# Patient Record
Sex: Male | Born: 1975 | Race: White | Hispanic: No | Marital: Married | Smoking: Current some day smoker
Health system: Southern US, Community
[De-identification: ages and names within clinical notes are randomized; demographics above are authoritative.]

## PROBLEM LIST (undated history)

## (undated) DIAGNOSIS — I341 Nonrheumatic mitral (valve) prolapse: Secondary | ICD-10-CM

## (undated) DIAGNOSIS — R3915 Urgency of urination: Secondary | ICD-10-CM

## (undated) DIAGNOSIS — Z9889 Other specified postprocedural states: Secondary | ICD-10-CM

## (undated) DIAGNOSIS — R3916 Straining to void: Secondary | ICD-10-CM

## (undated) DIAGNOSIS — IMO0002 Reserved for concepts with insufficient information to code with codable children: Secondary | ICD-10-CM

## (undated) DIAGNOSIS — R1030 Lower abdominal pain, unspecified: Secondary | ICD-10-CM

## (undated) DIAGNOSIS — R351 Nocturia: Secondary | ICD-10-CM

## (undated) DIAGNOSIS — F32A Depression, unspecified: Secondary | ICD-10-CM

## (undated) DIAGNOSIS — F4310 Post-traumatic stress disorder, unspecified: Secondary | ICD-10-CM

## (undated) DIAGNOSIS — F419 Anxiety disorder, unspecified: Secondary | ICD-10-CM

## (undated) DIAGNOSIS — R51 Headache: Secondary | ICD-10-CM

## (undated) DIAGNOSIS — R350 Frequency of micturition: Secondary | ICD-10-CM

## (undated) DIAGNOSIS — F329 Major depressive disorder, single episode, unspecified: Secondary | ICD-10-CM

## (undated) DIAGNOSIS — F909 Attention-deficit hyperactivity disorder, unspecified type: Secondary | ICD-10-CM

## (undated) DIAGNOSIS — K5792 Diverticulitis of intestine, part unspecified, without perforation or abscess without bleeding: Secondary | ICD-10-CM

## (undated) HISTORY — PX: CARDIAC SURGERY: SHX584

## (undated) HISTORY — PX: TOOTH EXTRACTION: SUR596

---

## 2000-11-08 ENCOUNTER — Encounter: Payer: Self-pay | Admitting: Emergency Medicine

## 2000-11-08 ENCOUNTER — Emergency Department (HOSPITAL_COMMUNITY): Admission: EM | Admit: 2000-11-08 | Discharge: 2000-11-08 | Payer: Self-pay | Admitting: Emergency Medicine

## 2001-05-13 ENCOUNTER — Encounter: Payer: Self-pay | Admitting: Emergency Medicine

## 2001-05-13 ENCOUNTER — Emergency Department (HOSPITAL_COMMUNITY): Admission: EM | Admit: 2001-05-13 | Discharge: 2001-05-13 | Payer: Self-pay | Admitting: Emergency Medicine

## 2002-11-13 ENCOUNTER — Emergency Department (HOSPITAL_COMMUNITY): Admission: EM | Admit: 2002-11-13 | Discharge: 2002-11-13 | Payer: Self-pay | Admitting: Emergency Medicine

## 2002-12-26 ENCOUNTER — Encounter: Payer: Self-pay | Admitting: Emergency Medicine

## 2002-12-26 ENCOUNTER — Emergency Department (HOSPITAL_COMMUNITY): Admission: EM | Admit: 2002-12-26 | Discharge: 2002-12-26 | Payer: Self-pay | Admitting: Emergency Medicine

## 2003-05-02 ENCOUNTER — Encounter: Payer: Self-pay | Admitting: Emergency Medicine

## 2003-05-02 ENCOUNTER — Emergency Department (HOSPITAL_COMMUNITY): Admission: EM | Admit: 2003-05-02 | Discharge: 2003-05-02 | Payer: Self-pay | Admitting: *Deleted

## 2004-09-14 ENCOUNTER — Emergency Department (HOSPITAL_COMMUNITY): Admission: EM | Admit: 2004-09-14 | Discharge: 2004-09-14 | Payer: Self-pay | Admitting: Emergency Medicine

## 2006-03-08 ENCOUNTER — Emergency Department (HOSPITAL_COMMUNITY): Admission: EM | Admit: 2006-03-08 | Discharge: 2006-03-08 | Payer: Self-pay | Admitting: Emergency Medicine

## 2006-04-23 ENCOUNTER — Emergency Department (HOSPITAL_COMMUNITY): Admission: EM | Admit: 2006-04-23 | Discharge: 2006-04-23 | Payer: Self-pay | Admitting: Emergency Medicine

## 2006-12-18 ENCOUNTER — Emergency Department (HOSPITAL_COMMUNITY): Admission: EM | Admit: 2006-12-18 | Discharge: 2006-12-18 | Payer: Self-pay | Admitting: Family Medicine

## 2007-03-16 ENCOUNTER — Emergency Department (HOSPITAL_COMMUNITY): Admission: EM | Admit: 2007-03-16 | Discharge: 2007-03-16 | Payer: Self-pay | Admitting: Emergency Medicine

## 2009-04-30 ENCOUNTER — Emergency Department (HOSPITAL_COMMUNITY): Admission: EM | Admit: 2009-04-30 | Discharge: 2009-04-30 | Payer: Self-pay | Admitting: Emergency Medicine

## 2009-11-23 ENCOUNTER — Emergency Department (HOSPITAL_COMMUNITY): Admission: EM | Admit: 2009-11-23 | Discharge: 2009-11-23 | Payer: Self-pay | Admitting: Emergency Medicine

## 2009-12-22 ENCOUNTER — Emergency Department (HOSPITAL_COMMUNITY): Admission: EM | Admit: 2009-12-22 | Discharge: 2009-12-22 | Payer: Self-pay | Admitting: Emergency Medicine

## 2010-01-01 ENCOUNTER — Emergency Department (HOSPITAL_COMMUNITY): Admission: EM | Admit: 2010-01-01 | Discharge: 2010-01-01 | Payer: Self-pay | Admitting: Emergency Medicine

## 2010-01-13 ENCOUNTER — Emergency Department (HOSPITAL_COMMUNITY): Admission: EM | Admit: 2010-01-13 | Discharge: 2010-01-13 | Payer: Self-pay | Admitting: Emergency Medicine

## 2010-11-05 ENCOUNTER — Ambulatory Visit (HOSPITAL_BASED_OUTPATIENT_CLINIC_OR_DEPARTMENT_OTHER)
Admission: RE | Admit: 2010-11-05 | Discharge: 2010-11-05 | Disposition: A | Discharge status: Home / Self Care | Payer: Medicare Other | Source: Ambulatory Visit | Attending: Urology | Admitting: Urology

## 2010-11-05 ENCOUNTER — Ambulatory Visit (HOSPITAL_COMMUNITY): Payer: Medicare Other | Attending: Urology

## 2010-11-05 DIAGNOSIS — I2510 Atherosclerotic heart disease of native coronary artery without angina pectoris: Secondary | ICD-10-CM

## 2010-11-05 DIAGNOSIS — Z01810 Encounter for preprocedural cardiovascular examination: Secondary | ICD-10-CM | POA: Insufficient documentation

## 2010-11-05 DIAGNOSIS — Z01818 Encounter for other preprocedural examination: Secondary | ICD-10-CM | POA: Insufficient documentation

## 2010-11-05 DIAGNOSIS — Z01812 Encounter for preprocedural laboratory examination: Secondary | ICD-10-CM | POA: Insufficient documentation

## 2010-11-05 DIAGNOSIS — N35919 Unspecified urethral stricture, male, unspecified site: Secondary | ICD-10-CM | POA: Insufficient documentation

## 2010-11-05 DIAGNOSIS — F17200 Nicotine dependence, unspecified, uncomplicated: Secondary | ICD-10-CM

## 2010-11-05 DIAGNOSIS — K219 Gastro-esophageal reflux disease without esophagitis: Secondary | ICD-10-CM | POA: Insufficient documentation

## 2010-11-05 HISTORY — PX: OTHER SURGICAL HISTORY: SHX169

## 2010-11-05 LAB — POCT HEMOGLOBIN-HEMACUE: Hemoglobin: 14.4 g/dL (ref 13.0–17.0)

## 2010-11-12 NOTE — Op Note (Signed)
  NAME:  Steven Montoya, Steven Montoya                 ACCOUNT NO.:  [id number]  MEDICAL RECORD NO.:  [id number]         PATIENT TYPE:  HAMB  LOCATION:                               FACILITY:  [facility]  PHYSICIAN:  Martina Sinner, MD DATE OF BIRTH:  1976-03-16  DATE OF PROCEDURE:  11/05/2010 DATE OF DISCHARGE:                              OPERATIVE REPORT   PREOPERATIVE DIAGNOSIS:  BXO plus urethral stricture.  POSTOPERATIVE DIAGNOSIS:  BXO plus urethral stricture.  SURGERY:  Cystoscopy, retrograde urethrogram plus urethral dilation.  DESCRIPTION OF PROCEDURE:  Mr. Vandermeulen with the above diagnosis.  He was given preoperative antibiotics.  He was prepped and draped in usual fashion.  He has latex allergic.  On physical examination, he had BXO and mild meatal stenosis.  His urethra proximally was not that hard.  I did a retrograde urethrogram using a 6-French open-ended ureteral catheter.  I did 2 films.  They were AP films with the patient in lithotomy.  The patient's penis pulled to the left side.  I did the retrograde urethrogram with approximately 10 mL of contrast.  Dye did not reach beyond the external sphincter which was fine.  He looked like the urethra itself was not that distensible throughout the length of the penile urethra.  I then used male sounds to gently dilate him from 12-20 [place].  I then used a 17-French scope.  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  The proximal bulbar urethra was healthy.  The rest of the bulbar urethra showed small ringlets of approximately a caliber of 22-French.  He then had gray urethra in most of the penile urethra, which should be at his baseline since it was not dilated with the male sounds.  The verumontanum and prostatic urethra and bladder mucosa were normal. There was no stitch, foreign body or carcinoma.  At best, there was grade 1/4 bladder trabeculation.  Bladder was emptied with a catheter.  The  patient was taken to the recovery room.  He will be followed for panurethral stricture disease but symptomatically having trouble from his distal BXO.  Self calibration will be discussed.          ______________________________ Martina Sinner, MD     SAM/MEDQ  D:  11/05/2010  T:  11/06/2010  Job:  [id number]  Electronically Signed by Alfredo Martinez MD on 11/12/2010 12:50:34 PM

## 2010-11-17 LAB — URINALYSIS, ROUTINE W REFLEX MICROSCOPIC
Glucose, UA: NEGATIVE mg/dL
Hgb urine dipstick: NEGATIVE
Ketones, ur: NEGATIVE mg/dL
Protein, ur: NEGATIVE mg/dL
Specific Gravity, Urine: 1.007 (ref 1.005–1.030)
Urobilinogen, UA: 0.2 mg/dL (ref 0.0–1.0)

## 2010-11-17 LAB — POCT CARDIAC MARKERS
Myoglobin, poc: 41.9 ng/mL (ref 12–200)
Troponin i, poc: <0.05 ng/mL (ref 0.00–0.09)

## 2010-11-17 LAB — POCT I-STAT, CHEM 8: Creatinine, Ser: 0.8 mg/dL (ref 0.4–1.5)

## 2010-11-17 LAB — RAPID URINE DRUG SCREEN, HOSP PERFORMED: Cocaine: NOT DETECTED

## 2011-09-04 ENCOUNTER — Emergency Department (INDEPENDENT_AMBULATORY_CARE_PROVIDER_SITE_OTHER): Payer: Medicare Other

## 2011-09-04 ENCOUNTER — Emergency Department (INDEPENDENT_AMBULATORY_CARE_PROVIDER_SITE_OTHER)
Admission: EM | Admit: 2011-09-04 | Discharge: 2011-09-04 | Disposition: A | Discharge status: Home / Self Care | Payer: Medicare Other | Source: Home / Self Care | Attending: Family Medicine | Admitting: Family Medicine

## 2011-09-04 ENCOUNTER — Encounter: Payer: Self-pay | Admitting: Emergency Medicine

## 2011-09-04 DIAGNOSIS — IMO0002 Reserved for concepts with insufficient information to code with codable children: Secondary | ICD-10-CM

## 2011-09-04 DIAGNOSIS — X58XXXA Exposure to other specified factors, initial encounter: Secondary | ICD-10-CM

## 2011-09-04 DIAGNOSIS — S6000XA Contusion of unspecified finger without damage to nail, initial encounter: Secondary | ICD-10-CM

## 2011-09-04 DIAGNOSIS — S60229A Contusion of unspecified hand, initial encounter: Secondary | ICD-10-CM

## 2011-09-04 DIAGNOSIS — T148XXA Other injury of unspecified body region, initial encounter: Secondary | ICD-10-CM

## 2011-09-04 MED ORDER — HYDROCODONE-ACETAMINOPHEN 5-325 MG PO TABS
1.0000 | ORAL_TABLET | Freq: Once | ORAL | Status: AC
  Administered 2011-09-04: 1 via ORAL

## 2011-09-04 MED ORDER — HYDROCODONE-ACETAMINOPHEN 5-325 MG PO TABS: ORAL_TABLET | ORAL | Status: AC

## 2011-09-04 MED ORDER — HYDROCODONE-ACETAMINOPHEN 5-325 MG PO TABS
ORAL_TABLET | ORAL | Status: AC
  Filled 2011-09-04: qty 1

## 2011-09-04 MED ORDER — CEPHALEXIN 500 MG PO CAPS: 500.0000 mg | ORAL_CAPSULE | Freq: Two times a day (BID) | ORAL | Status: AC

## 2011-09-04 NOTE — ED Notes (Signed)
Pt here with left hand index finger injury after slamming digit in car door x 3 hrs ago.pain starting in nailbed with bleeding seen relieved with pressure dressing and radiating to wrist,constant,throbbing pain.bleeding seen at nailbed but intact

## 2011-09-04 NOTE — ED Provider Notes (Signed)
History     CSN: [id number]  Arrival date & time 09/04/11  1233   First MD Initiated Contact with Patient 09/04/11 1357      Chief Complaint  Patient presents with  . Finger Injury    (Consider location/radiation/quality/duration/timing/severity/associated sxs/prior treatment) HPI Comments: Steven Montoya presents for evaluation of pain in his LEFT index finger and LEFT hand after slamming it in the car trunk lid while retrieving groceries.   Patient is a 36 y.o. male presenting with hand injury. The history is provided by the patient.  Hand Injury  The incident occurred 3 to 5 hours ago. The incident occurred at home. The injury mechanism was a direct blow. The pain is present in the left hand and left fingers. The quality of the pain is described as aching and throbbing. The pain is moderate. The pain has been constant since the incident.    History reviewed. No pertinent past medical history.  History reviewed. No pertinent past surgical history.  No family history on file.  History  Substance Use Topics  . Smoking status: Current Everyday Smoker  . Smokeless tobacco: Not on file  . Alcohol Use: No      Review of Systems  Constitutional: Negative.   HENT: Negative.   Eyes: Negative.   Respiratory: Negative.   Cardiovascular: Negative.   Gastrointestinal: Negative.   Genitourinary: Negative.   Musculoskeletal: Negative.        LEFT hand pain  Skin: Negative.   Neurological: Negative.     Allergies  Review of patient's allergies indicates no known allergies.  Home Medications   Current Outpatient Rx  Name Route Sig Dispense Refill  . CEPHALEXIN 500 MG PO CAPS Oral Take 1 capsule (500 mg total) by mouth 2 (two) times daily. 10 capsule 0  . HYDROCODONE-ACETAMINOPHEN 5-325 MG PO TABS  Take one to two tablets every 4 to 6 hours as needed for pain 20 tablet 0    BP 124/83  Pulse 70  Temp(Src) 98.1 F (36.7 C) (Oral)  Resp 16  SpO2 100%  Physical Exam  Nursing  note and vitals reviewed. Constitutional: He is oriented to person, place, and time. He appears well-developed and well-nourished.  HENT:  Head: Normocephalic and atraumatic.  Eyes: EOM are normal.  Neck: Normal range of motion.  Pulmonary/Chest: Effort normal.  Musculoskeletal: Normal range of motion.       Arms:      Hands: Neurological: He is alert and oriented to person, place, and time.  Skin: Skin is warm and dry.  Psychiatric: His behavior is normal.    ED Course  Procedures (including critical care time)  Labs Reviewed - No data to display Dg Hand Complete Left  09/04/2011  *RADIOLOGY REPORT*  Clinical Data: Trauma, pain  LEFT HAND - COMPLETE 3+ VIEW  Comparison: None.  Findings: Normal alignment without displaced fracture.  No focal radiographic swelling.  Radiopaque ring on the fourth finger. Preserved joint spaces.  IMPRESSION: No acute osseous finding.  Original Report Authenticated By: Judie Petit. Ruel Favors, M.D.     1. Laceration   2. Finger contusion   3. Hand contusion       MDM  Xray negative for fracture; laceration well-opposed not requiring any repair; sterile dressing applied        Richardo Priest, MD 09/04/11 1554

## 2012-01-24 ENCOUNTER — Encounter (HOSPITAL_COMMUNITY): Payer: Self-pay

## 2012-01-24 ENCOUNTER — Emergency Department (INDEPENDENT_AMBULATORY_CARE_PROVIDER_SITE_OTHER)
Admission: EM | Admit: 2012-01-24 | Discharge: 2012-01-24 | Disposition: A | Discharge status: Home / Self Care | Payer: Medicaid Other | Source: Home / Self Care

## 2012-01-24 DIAGNOSIS — M546 Pain in thoracic spine: Secondary | ICD-10-CM

## 2012-01-24 MED ORDER — TRAMADOL HCL 50 MG PO TABS: 50.0000 mg | ORAL_TABLET | Freq: Four times a day (QID) | ORAL | Status: AC | PRN

## 2012-01-24 MED ORDER — MELOXICAM 15 MG PO TABS: 15.0000 mg | ORAL_TABLET | Freq: Every day | ORAL | Status: DC

## 2012-01-24 MED ORDER — CYCLOBENZAPRINE HCL 10 MG PO TABS: 10.0000 mg | ORAL_TABLET | Freq: Three times a day (TID) | ORAL | Status: AC | PRN

## 2012-01-24 NOTE — ED Notes (Signed)
Pt c/o back pain following lifting weights yesterday.  Increased pain today.

## 2012-01-24 NOTE — ED Provider Notes (Signed)
Steven Montoya is a 36 y.o. male who presents to Urgent Care today for thoracic back pain.  Patient was lifting weights yesterday and had no pain. However upon awakening this morning he noted significant back pain along the rhomboids on the left side.  He denies any radiating pain weakness or numbness.  He denies any trouble walking or bowel or bladder dysfunction. He has not tried any medications yet this pain.  He has not had any similar symptoms before.   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    PMH reviewed. Significant for ADHD on disability History  Substance Use Topics  . Smoking status: Current Everyday Smoker  . Smokeless tobacco: Not on file  . Alcohol Use: No   ROS as above Medications reviewed. No current facility-administered medications for this encounter.   Current Outpatient Prescriptions  Medication Sig Dispense Refill  . cyclobenzaprine (FLEXERIL) 10 MG tablet Take 1 tablet (10 mg total) by mouth 3 (three) times daily as needed for muscle spasms.  30 tablet  0  . meloxicam (MOBIC) 15 MG tablet Take 1 tablet (15 mg total) by mouth daily.  14 tablet  0  . traMADol (ULTRAM) 50 MG tablet Take 1 tablet (50 mg total) by mouth every 6 (six) hours as needed for pain.  10 tablet  0    Exam:  BP 137/87  Pulse 80  Temp(Src) 98 F (36.7 C) (Oral)  Resp 18  SpO2 97% Gen: Well NAD MUSCULOSKELETAL:  Nontender over spinal midline.  Tender over left rhombus.  Normal shoulder range of motion to external and internal rotation.  Normal hand strength and sensation.  Reflexes are normal and equal in all 4 extremities.   No results found for this or any previous visit (from the past 24 hour(s)). No results found.  Assessment and Plan: 36 y.o. male with rhombus muscle injury.  Patient has a muscular thoracic back pain.  Plan to treat with meloxicam and Flexeril. We'll use tramadol as needed for extreme pain for a few days.  Discussed warning signs or symptoms for  worsening back pain and provided a handout.  Additionally I provided a work. Patient expresses understanding.     Rodolph Bong, MD 01/24/12 2118

## 2012-01-24 NOTE — Discharge Instructions (Signed)
Thank you for coming in today. You pulled a muscle in your back.  Take the meloxicam daily.  Take flexeril as needed at night.  Take the tramadol as needed for bad pain.  Take tylenol as well as needed.  Do not take ibuprofren or aleve.  Come back or go to the emergency room if you notice new weakness new numbness problems walking or bowel or bladder problems.  Back Pain, Adult Back pain is very common. The pain often gets better over time. The cause of back pain is usually not dangerous. Most people can learn to manage their back pain on their own.  HOME CARE   Stay active. Start with short walks on flat ground if you can. Try to walk farther each day.   Do not sit, drive, or stand in one place for more than 30 minutes. Do not stay in bed.   Do not avoid exercise or work. Activity can help your back heal faster.   Be careful when you bend or lift an object. Bend at your knees, keep the object close to you, and do not twist.   Sleep on a firm mattress. Lie on your side, and bend your knees. If you lie on your back, put a pillow under your knees.   Only take medicines as told by your doctor.   Put ice on the injured area.   Put ice in a plastic bag.   Place a towel between your skin and the bag.   Leave the ice on for 15 to 20 minutes, 3 to 4 times a day for the first 2 to 3 days. After that, you can switch between ice and heat packs.   Ask your doctor about back exercises or massage.   Avoid feeling anxious or stressed. Find good ways to deal with stress, such as exercise.  GET HELP RIGHT AWAY IF:   Your pain does not go away with rest or medicine.   Your pain does not go away in 1 week.   You have new problems.   You do not feel well.   The pain spreads into your legs.   You cannot control when you poop (bowel movement) or pee (urinate).   Your arms or legs feel weak or lose feeling (numbness).   You feel sick to your stomach (nauseous) or throw up (vomit).    You have belly (abdominal) pain.   You feel like you may pass out (faint).  MAKE SURE YOU:   Understand these instructions.   Will watch your condition.   Will get help right away if you are not doing well or get worse.  Document Released: 02/02/2008 Document Revised: 08/05/2011 Document Reviewed: 01/04/2011 [facility] Patient Information 2012 [place], [place].

## 2012-01-27 NOTE — ED Provider Notes (Signed)
Medical screening examination/treatment/procedure(s) were performed by the resident and as supervising physician I was immediately available for consultation/collaboration.  Luiz Blare MD   Luiz Blare, MD 01/27/12 [phone]

## 2012-06-28 ENCOUNTER — Encounter (HOSPITAL_COMMUNITY): Payer: Self-pay | Admitting: *Deleted

## 2012-06-28 ENCOUNTER — Emergency Department (HOSPITAL_COMMUNITY)
Admission: EM | Admit: 2012-06-28 | Discharge: 2012-06-28 | Disposition: A | Discharge status: Home / Self Care | Payer: Medicaid Other | Source: Home / Self Care

## 2012-06-28 ENCOUNTER — Emergency Department (INDEPENDENT_AMBULATORY_CARE_PROVIDER_SITE_OTHER): Payer: Medicaid Other

## 2012-06-28 DIAGNOSIS — S0083XA Contusion of other part of head, initial encounter: Secondary | ICD-10-CM

## 2012-06-28 DIAGNOSIS — K044 Acute apical periodontitis of pulpal origin: Secondary | ICD-10-CM

## 2012-06-28 DIAGNOSIS — K047 Periapical abscess without sinus: Secondary | ICD-10-CM

## 2012-06-28 DIAGNOSIS — K089 Disorder of teeth and supporting structures, unspecified: Secondary | ICD-10-CM

## 2012-06-28 DIAGNOSIS — S1093XA Contusion of unspecified part of neck, initial encounter: Secondary | ICD-10-CM

## 2012-06-28 DIAGNOSIS — K0889 Other specified disorders of teeth and supporting structures: Secondary | ICD-10-CM

## 2012-06-28 DIAGNOSIS — S00531A Contusion of lip, initial encounter: Secondary | ICD-10-CM

## 2012-06-28 MED ORDER — KETOROLAC TROMETHAMINE 60 MG/2ML IM SOLN
INTRAMUSCULAR | Status: AC
  Filled 2012-06-28: qty 2

## 2012-06-28 MED ORDER — HYDROCODONE-ACETAMINOPHEN 5-325 MG PO TABS: 1.0000 | ORAL_TABLET | ORAL | Status: DC | PRN

## 2012-06-28 MED ORDER — KETOROLAC TROMETHAMINE 60 MG/2ML IM SOLN
60.0000 mg | Freq: Once | INTRAMUSCULAR | Status: AC
  Administered 2012-06-28: 60 mg via INTRAMUSCULAR

## 2012-06-28 MED ORDER — AMOXICILLIN 500 MG PO CAPS: 500.0000 mg | ORAL_CAPSULE | Freq: Three times a day (TID) | ORAL | Status: DC

## 2012-06-28 NOTE — ED Provider Notes (Signed)
History     CSN: [id number]  Arrival date & time 06/28/12  1224   None     Chief Complaint  Patient presents with  . Facial Injury    (Consider location/radiation/quality/duration/timing/severity/associated sxs/prior treatment) HPI Comments: 36 year old male was putting up a glass door to the shower when a portion of the door fell against the left side of his head and face. He is complaining of pain in the left craniofacial area. Complains of pain across the left zygoma TMJ and parietal cranium. Denies loss of consciousness or change in orientation or behavior. He has remained alert and oriented according to significant other. Denies nausea vomiting double vision, blurred vision or problems with speech hearing or swallowing. Denies focal paresthesias or motor weakness. He states it hurts to open his jaw and can't open it completely due to left TMJ pain. He also is complaining of upper anterolateral teeth pain where one tooth appears to be loose. No apparent dental fractures or avulsions.  Patient is a 36 y.o. male presenting with facial injury.  Facial Injury  Pertinent negatives include no numbness, no visual disturbance, no hearing loss and no neck pain.    History reviewed. No pertinent past medical history.  History reviewed. No pertinent past surgical history.  No family history on file.  History  Substance Use Topics  . Smoking status: Current Every Day Smoker  . Smokeless tobacco: Not on file  . Alcohol Use: No      Review of Systems  Constitutional: Negative for fever, chills, diaphoresis, activity change and fatigue.  HENT: Positive for dental problem. Negative for hearing loss, ear pain, nosebleeds, congestion, sore throat, facial swelling, drooling, trouble swallowing, neck pain, neck stiffness, sinus pressure, tinnitus and ear discharge.   Eyes: Negative for photophobia, pain, discharge, redness, itching and visual disturbance.  Respiratory: Negative.     Gastrointestinal: Negative.   Musculoskeletal: Negative for myalgias, back pain, joint swelling and gait problem.  Neurological: Negative for dizziness, tremors, syncope, facial asymmetry, speech difficulty and numbness.  Psychiatric/Behavioral: Negative.     Allergies  Review of patient's allergies indicates no known allergies.  Home Medications   Current Outpatient Rx  Name Route Sig Dispense Refill  . AMOXICILLIN 500 MG PO CAPS Oral Take 1 capsule (500 mg total) by mouth 3 (three) times daily. 21 capsule 0  . HYDROCODONE-ACETAMINOPHEN 5-325 MG PO TABS Oral Take 1 tablet by mouth every 4 (four) hours as needed for pain. 12 tablet 0  . MELOXICAM 15 MG PO TABS Oral Take 1 tablet (15 mg total) by mouth daily. 14 tablet 0    BP 139/90  Pulse 64  Temp 98 F (36.7 C) (Oral)  Resp 18  SpO2 100%  Physical Exam  Constitutional: He is oriented to person, place, and time. He appears well-developed and well-nourished. No distress.  HENT:  Head: Normocephalic.  Right Ear: External ear normal.  Left Ear: External ear normal.  Nose: Nose normal.  Mouth/Throat: Oropharynx is clear and moist.       Oropharynx is clear, tongue is midline, soft palate rises symmetrically. There is general tenderness over the left upper canine tooth. No evidence of avulsion or fracture. No bleeding. Lower teeth without dental tenderness, buccal mucosa intact. Tenderness over the left zygoma, TMJ, and parietal scalp cranium. No tenderness over the left orbit. No edema or asymmetry is appreciated anywhere in the left hemisphere. Initially he stated he was unable to open his jaw or move it side to side but  when asked to examine his oropharynx he was able to open his jaw almost 100%. He was also able to move his jaw all left and right, back and forth with minimal discomfort.  Nasal bone intact and nontender.  Eyes: Conjunctivae normal and EOM are normal. Pupils are equal, round, and reactive to light.  Neck:  Normal range of motion. Neck supple.  Cardiovascular: Normal rate and normal heart sounds.   Pulmonary/Chest: Effort normal and breath sounds normal.  Lymphadenopathy:    He has no cervical adenopathy.  Neurological: He is alert and oriented to person, place, and time. No cranial nerve deficit. He exhibits normal muscle tone. Coordination normal.  Skin: Skin is warm and dry.    ED Course  Procedures (including critical care time)  Labs Reviewed - No data to display Dg Facial Bones Complete  06/28/2012  *RADIOLOGY REPORT*  Clinical Data: Facial injury  FACIAL BONES COMPLETE 3+V  Comparison: None.  Findings: Five views facial bones submitted.  No displaced fracture or subluxation.  No radiopaque foreign body.  IMPRESSION: No fracture or subluxation is identified.   Original Report Authenticated By: Natasha Mead, M.D.      1. Facial contusion   2. Contusion, lip   3. Pain, dental   4. Tooth infection       MDM  Dg Facial Bones Complete  06/28/2012  *RADIOLOGY REPORT*  Clinical Data: Facial injury  FACIAL BONES COMPLETE 3+V  Comparison: None.  Findings: Five views facial bones submitted.  No displaced fracture or subluxation.  No radiopaque foreign body.  IMPRESSION: No fracture or subluxation is identified.   Original Report Authenticated By: Natasha Mead, M.D.    The no signs of head injury and his x-rays are negative. There is no edema to the head or facial bones. He remained alert oriented and in no distress. His neuro exam is negative. Toradol 60 mg IM now Ibuprofen 400 mg every 6 hours when necessary pain Norco 5 one every 4 hours when necessary pain #12 Follow up with your dentist as soon as possible Amoxicillin 500 3 times a day for 7 days For any signs of head injury as in your written instructions are to go the emergency department promptly. This would include unusual sleepiness difficulty in waking, confusion, disorientation, nausea and vomiting, problems with vision, speech,  hearing, swallowing, numbness or weakness on one side of the body, headache or any other symptom that she was not experiencing now. He is stable on discharge and he has a rapid gait down the hallway well-balanced and having no distress.        Hayden Rasmussen, NP 06/28/12 [phone]

## 2012-06-28 NOTE — ED Notes (Signed)
Pt  Reported   About  4  Hours   Ago   He  Was  Changing  A  Engineer, civil (consulting)  And  The  Door  Hit  Him  In the  Face    He  Reports  He  Did  Not black out    He  Reports  Feeling a  Bit nauseated  At  Time   He  Reports          His   Top   Teeth  Are  Loose  But they  Are  Still   In place  He    Has  Some  Swelling  Of  His  Upper  Lip      HIS  PUPILS  ARE  EQUAL AND  REACTIVE

## 2012-06-29 NOTE — ED Provider Notes (Signed)
Medical screening examination/treatment/procedure(s) were performed by non-physician practitioner and as supervising physician I was immediately available for consultation/collaboration.   [facility]; MD Medical screening examination/treatment/procedure(s) were performed by non-physician practitioner and as supervising physician I was immediately available for consultation/collaboration.   [facility]; MD   Sharin Grave, MD 06/29/12 [phone]

## 2012-11-30 ENCOUNTER — Other Ambulatory Visit: Payer: Self-pay | Admitting: Urology

## 2012-12-08 ENCOUNTER — Encounter (HOSPITAL_BASED_OUTPATIENT_CLINIC_OR_DEPARTMENT_OTHER): Payer: Self-pay | Admitting: *Deleted

## 2012-12-08 NOTE — Progress Notes (Signed)
NPO AFTER MN INCLUDING NO CHEW TOBACCO. ARRIVES AT [phone]. NEEDS HG.

## 2012-12-16 NOTE — Anesthesia Preprocedure Evaluation (Addendum)
Anesthesia Evaluation  Patient identified by MRN, date of birth, ID band Patient awake    Reviewed: Allergy & Precautions, H&P , NPO status , Patient's Chart, lab work & pertinent test results  Airway Mallampati: II TM Distance: >3 FB Neck ROM: Full    Dental no notable dental hx.    Pulmonary former smoker,  Quit smoking 2 months ago. breath sounds clear to auscultation  Pulmonary exam normal       Cardiovascular negative cardio ROS  Rhythm:Regular Rate:Normal     Neuro/Psych negative neurological ROS  negative psych ROS   GI/Hepatic negative GI ROS, Neg liver ROS,   Endo/Other  negative endocrine ROS  Renal/GU negative Renal ROS  negative genitourinary   Musculoskeletal negative musculoskeletal ROS (+)   Abdominal   Peds negative pediatric ROS (+)  Hematology negative hematology ROS (+)   Anesthesia Other Findings   Reproductive/Obstetrics negative OB ROS                          Anesthesia Physical Anesthesia Plan  ASA: II  Anesthesia Plan: General   Post-op Pain Management:    Induction: Intravenous  Airway Management Planned: LMA  Additional Equipment:   Intra-op Plan:   Post-operative Plan: Extubation in OR  Informed Consent: I have reviewed the patients History and Physical, chart, labs and discussed the procedure including the risks, benefits and alternatives for the proposed anesthesia with the patient or authorized representative who has indicated his/her understanding and acceptance.   Dental advisory given  Plan Discussed with: CRNA  Anesthesia Plan Comments:         Anesthesia Quick Evaluation

## 2012-12-17 NOTE — Op Note (Signed)
History of Present Illness  Steven Montoya had meatal stenosis when I saw him in February 2012 due to BXO . He had pan-urethral stricture disease on cystoscopy. I do not think he ever followed up. He saw [facility] on February 25 with gross hematuria for 3 days. A urine culture was negative.  Review of Systems: No other change in bowel or neurologic systems.  Urinalysis: Rare bacteria. He was here with a CT Scan and for likely a cystoscopy.  I reviewed Steven Montoya's CT Scan today and the upper tracts looked normal. I hope the ureters were normal. I am not convinced that on the right side of the bladder that there is not a bladder shadow, that it was probably more of a bowel artifact. When he was cystoscoped in March 2012 under anesthesia, the bladder was normal.  Steven Montoya has significant flow symptoms. He sometimes sit to urinate. His flow is variable, though he was nonspecific. It is hurting in the penis. It does spray. I think there is no question, it is much more frequent during the day and he is getting up 3-4 times at night. He quit smoking in the past. He is having pain after intercourse and with walking.  There is no other modifying factors or associated signs or symptoms. There is no other aggravating or relieving factors. The symptoms are moderate in severity and persistent.  Past Medical History  Problems  1. History of Anxiety (Symptom) 300.00  2. History of Arthritis V13.4  3. History of Esophageal Reflux 530.81  Surgical History  Problems  1. History of Cystoscopy For Urethral Stricture  2. History of No Surgical Problems  Current Meds  1. No Reported Medications  Allergies  Non-Medication  1. Latex  Family History  Problems  1. Paternal history of Congestive Heart Failure  2. Family history of Death In The Family Father  61; heart failure  3. Family history of Family Health Status - Mother's Age  52  4. Family history of Family Health Status Children ___ Living Daughters  2  5. Family  history of Family Health Status Children ___ Living Sons  2  Social History  Problems  1. Caffeine Use  1  2. Marital History - Currently Married  3. Tobacco Use 305.1  smokes 1/2 ppd for 25 years  4. Unemployed  Denied  5. History of Alcohol Use  Results/Data  Urine [Data Includes: Last 1 Day]   02Apr2014   COLOR  YELLOW   APPEARANCE  CLEAR   SPECIFIC GRAVITY  1.025   pH  6.0   GLUCOSE  NEG mg/dL   BILIRUBIN  NEG   KETONE  NEG mg/dL   BLOOD  NEG   PROTEIN  30 mg/dL   UROBILINOGEN  0.2 mg/dL   NITRITE  NEG   LEUKOCYTE ESTERASE  NEG   SQUAMOUS EPITHELIAL/HPF  FEW   WBC  0-2 WBC/hpf   RBC  NONE SEEN RBC/hpf   BACTERIA  RARE   CRYSTALS  NONE SEEN   CASTS  NONE SEEN   Assessment  Assessed  1. 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 Urethral Stricture 598.9  Plan  Microscopic Hematuria (599.72)  1. Cysto Requested for: 02Apr2014  Discussion/Summary  I talked to Steven Montoya and I really believe he likely has recurrence of a stricture disease. We talked about a cystoscopy and balloon dilation.  We talked about balloon dilation in detail. Pros, cons, general surgical and anesthetic risks, and other   options including watchful waiting were discussed. He understands that dilation is generally successful in most cases but [place]-term success rates are low. We talked about the risk of persistent, de novo, or worsening incontinence and voiding dysfunction. Risks were described but not limited to the discussion of injury to neighboring structures and soft tissues. Bleeding, infection, pain, erectile dysfunction, and spraying of urination were discussed. The risk of neuropathy was discussed as well as the usual post-operative course.  He consented to this. We will clear his bladder post dilation.  We will have to talk about prophylactic measures next time. I will rebaseline his stricture disease.  After a thorough review of the management options for the patient's  condition the patient  elected to proceed with surgical therapy as noted above. We have discussed the potential benefits and risks of the procedure, side effects of the proposed treatment, the likelihood of the patient achieving the goals of the procedure, and any potential problems that might occur during the procedure or recuperation. Informed consent has been obtained.  

## 2012-12-18 ENCOUNTER — Encounter (HOSPITAL_BASED_OUTPATIENT_CLINIC_OR_DEPARTMENT_OTHER): Payer: Self-pay | Admitting: Anesthesiology

## 2012-12-18 ENCOUNTER — Encounter (HOSPITAL_BASED_OUTPATIENT_CLINIC_OR_DEPARTMENT_OTHER): Payer: Self-pay | Admitting: *Deleted

## 2012-12-18 ENCOUNTER — Ambulatory Visit (HOSPITAL_BASED_OUTPATIENT_CLINIC_OR_DEPARTMENT_OTHER): Payer: Medicaid Other | Admitting: Anesthesiology

## 2012-12-18 ENCOUNTER — Ambulatory Visit (HOSPITAL_BASED_OUTPATIENT_CLINIC_OR_DEPARTMENT_OTHER)
Admission: RE | Admit: 2012-12-18 | Discharge: 2012-12-18 | Disposition: A | Discharge status: Home / Self Care | Payer: Medicaid Other | Source: Ambulatory Visit | Attending: Urology | Admitting: Urology

## 2012-12-18 ENCOUNTER — Encounter (HOSPITAL_BASED_OUTPATIENT_CLINIC_OR_DEPARTMENT_OTHER)
Admission: RE | Disposition: A | Discharge status: Home / Self Care | Payer: Self-pay | Source: Ambulatory Visit | Attending: Urology

## 2012-12-18 DIAGNOSIS — K219 Gastro-esophageal reflux disease without esophagitis: Secondary | ICD-10-CM | POA: Insufficient documentation

## 2012-12-18 DIAGNOSIS — M129 Arthropathy, unspecified: Secondary | ICD-10-CM | POA: Insufficient documentation

## 2012-12-18 DIAGNOSIS — F17200 Nicotine dependence, unspecified, uncomplicated: Secondary | ICD-10-CM | POA: Insufficient documentation

## 2012-12-18 DIAGNOSIS — N35919 Unspecified urethral stricture, male, unspecified site: Secondary | ICD-10-CM | POA: Insufficient documentation

## 2012-12-18 DIAGNOSIS — Z91040 Latex allergy status: Secondary | ICD-10-CM | POA: Insufficient documentation

## 2012-12-18 DIAGNOSIS — N480 Leukoplakia of penis: Secondary | ICD-10-CM | POA: Insufficient documentation

## 2012-12-18 DIAGNOSIS — N3289 Other specified disorders of bladder: Secondary | ICD-10-CM | POA: Insufficient documentation

## 2012-12-18 DIAGNOSIS — F411 Generalized anxiety disorder: Secondary | ICD-10-CM | POA: Insufficient documentation

## 2012-12-18 HISTORY — DX: Lower abdominal pain, unspecified: R10.30

## 2012-12-18 HISTORY — DX: Reserved for concepts with insufficient information to code with codable children: IMO0002

## 2012-12-18 HISTORY — DX: Frequency of micturition: R35.0

## 2012-12-18 HISTORY — DX: Straining to void: R39.16

## 2012-12-18 HISTORY — DX: Nocturia: R35.1

## 2012-12-18 HISTORY — DX: Urgency of urination: R39.15

## 2012-12-18 LAB — POCT HEMOGLOBIN-HEMACUE: Hemoglobin: 15.6 g/dL (ref 13.0–17.0)

## 2012-12-18 SURGERY — CYSTOSCOPY WITH RETROGRADE URETHROGRAM
Anesthesia: General | Site: Urethra | Wound class: Clean Contaminated

## 2012-12-18 MED ORDER — PROMETHAZINE HCL 25 MG/ML IJ SOLN
6.2500 mg | INTRAMUSCULAR | Status: DC | PRN
Start: 2012-12-18 — End: 2012-12-18
  Filled 2012-12-18: qty 1

## 2012-12-18 MED ORDER — CIPROFLOXACIN HCL 250 MG PO TABS: 250.0000 mg | ORAL_TABLET | Freq: Two times a day (BID) | ORAL | Status: DC

## 2012-12-18 MED ORDER — ONDANSETRON HCL 4 MG/2ML IJ SOLN
INTRAMUSCULAR | Status: DC | PRN
  Administered 2012-12-18: 4 mg via INTRAVENOUS

## 2012-12-18 MED ORDER — OXYCODONE HCL 5 MG PO TABS
5.0000 mg | ORAL_TABLET | Freq: Once | ORAL | Status: AC
  Administered 2012-12-18: 5 mg via ORAL
  Filled 2012-12-18: qty 1

## 2012-12-18 MED ORDER — FENTANYL CITRATE 0.05 MG/ML IJ SOLN
INTRAMUSCULAR | Status: DC | PRN
  Administered 2012-12-18 (×2): 100 ug via INTRAVENOUS

## 2012-12-18 MED ORDER — DEXAMETHASONE SODIUM PHOSPHATE 4 MG/ML IJ SOLN
INTRAMUSCULAR | Status: DC | PRN
  Administered 2012-12-18: 10 mg via INTRAVENOUS

## 2012-12-18 MED ORDER — ACETAMINOPHEN 10 MG/ML IV SOLN
INTRAVENOUS | Status: DC | PRN
  Administered 2012-12-18: 1000 mg via INTRAVENOUS

## 2012-12-18 MED ORDER — CIPROFLOXACIN IN D5W 400 MG/200ML IV SOLN
400.0000 mg | INTRAVENOUS | Status: AC
  Administered 2012-12-18: 400 mg via INTRAVENOUS
  Filled 2012-12-18: qty 200

## 2012-12-18 MED ORDER — FENTANYL CITRATE 0.05 MG/ML IJ SOLN
25.0000 ug | INTRAMUSCULAR | Status: DC | PRN
  Administered 2012-12-18 (×4): 25 ug via INTRAVENOUS
  Filled 2012-12-18: qty 1

## 2012-12-18 MED ORDER — MIDAZOLAM HCL 5 MG/5ML IJ SOLN
INTRAMUSCULAR | Status: DC | PRN
  Administered 2012-12-18: 2 mg via INTRAVENOUS

## 2012-12-18 MED ORDER — PROPOFOL 10 MG/ML IV BOLUS
INTRAVENOUS | Status: DC | PRN
  Administered 2012-12-18: 200 mg via INTRAVENOUS

## 2012-12-18 MED ORDER — 0.9 % SODIUM CHLORIDE (POUR BTL) OPTIME
TOPICAL | Status: DC | PRN
  Administered 2012-12-18: 1000 mL

## 2012-12-18 MED ORDER — IOHEXOL 350 MG/ML SOLN
INTRAVENOUS | Status: DC | PRN
  Administered 2012-12-18: 50 mL via URETHRAL

## 2012-12-18 MED ORDER — KETOROLAC TROMETHAMINE 30 MG/ML IJ SOLN
INTRAMUSCULAR | Status: DC | PRN
  Administered 2012-12-18: 30 mg via INTRAVENOUS

## 2012-12-18 MED ORDER — LIDOCAINE HCL (CARDIAC) 20 MG/ML IV SOLN
INTRAVENOUS | Status: DC | PRN
  Administered 2012-12-18: 80 mg via INTRAVENOUS

## 2012-12-18 MED ORDER — STERILE WATER FOR IRRIGATION IR SOLN
Status: DC | PRN
  Administered 2012-12-18: 3000 mL

## 2012-12-18 MED ORDER — HYDROCODONE-ACETAMINOPHEN 5-325 MG PO TABS: 1.0000 | ORAL_TABLET | Freq: Four times a day (QID) | ORAL | Status: DC | PRN

## 2012-12-18 MED ORDER — LACTATED RINGERS IV SOLN
INTRAVENOUS | Status: DC
  Administered 2012-12-18: 07:00:00 via INTRAVENOUS
  Filled 2012-12-18: qty 1000

## 2012-12-18 SURGICAL SUPPLY — 19 items
BAG DRAIN URO-CYSTO SKYTR STRL (DRAIN) ×2 IMPLANT
BAG DRN UROCATH (DRAIN) ×1
BALLN NEPHROSTOMY (BALLOONS) ×2
BALLOON NEPHROSTOMY (BALLOONS) IMPLANT
CANISTER SUCT LVC 12 LTR MEDI- (MISCELLANEOUS) IMPLANT
CATH INTERMIT  6FR 70CM (CATHETERS) ×1 IMPLANT
CLOTH BEACON ORANGE TIMEOUT ST (SAFETY) ×2 IMPLANT
DRAPE CAMERA CLOSED 9X96 (DRAPES) ×2 IMPLANT
GLOVE BIO SURGEON STRL SZ7.5 (GLOVE) ×1 IMPLANT
GLOVE BIOGEL M STER SZ 6 (GLOVE) ×1 IMPLANT
GLOVE BIOGEL M STRL SZ7.5 (GLOVE) ×1 IMPLANT
GLOVE BIOGEL PI IND STRL 7.5 (GLOVE) IMPLANT
GLOVE BIOGEL PI INDICATOR 7.5 (GLOVE) ×1
GLOVE INDICATOR 7.0 STRL GRN (GLOVE) ×1 IMPLANT
GOWN STRL NON-REIN LRG LVL3 (GOWN DISPOSABLE) ×1 IMPLANT
GOWN STRL REIN XL XLG (GOWN DISPOSABLE) ×2 IMPLANT
GUIDEWIRE STR DUAL SENSOR (WIRE) ×1 IMPLANT
NS IRRIG 500ML POUR BTL (IV SOLUTION) IMPLANT
PACK CYSTOSCOPY (CUSTOM PROCEDURE TRAY) ×2 IMPLANT

## 2012-12-18 NOTE — Transfer of Care (Signed)
Immediate Anesthesia Transfer of Care Note  Patient: Steven Montoya  Procedure(s) Performed: Procedure(s): CYSTOSCOPY BALLOON DILATION WITH RETROGRADE URETHROGRAM (N/A)  Patient Location: PACU  Anesthesia Type:General  Level of Consciousness: awake, alert  and oriented  Airway & Oxygen Therapy: Patient Spontanous Breathing and Patient connected to nasal cannula oxygen  Post-op Assessment: Report given to PACU RN  Post vital signs: Reviewed and stable  Complications: No apparent anesthesia complications

## 2012-12-18 NOTE — Anesthesia Procedure Notes (Signed)
Procedure Name: LMA Insertion Date/Time: 12/18/2012 7:34 AM Performed by: Maris Berger T Pre-anesthesia Checklist: Patient identified, Emergency Drugs available, Suction available and Patient being monitored Patient Re-evaluated:Patient Re-evaluated prior to inductionOxygen Delivery Method: Circle System Utilized Preoxygenation: Pre-oxygenation with 100% oxygen Intubation Type: IV induction Ventilation: Mask ventilation without difficulty LMA: LMA inserted LMA Size: 4.0 Number of attempts: 1 Placement Confirmation: positive ETCO2 Dental Injury: Teeth and Oropharynx as per pre-operative assessment  Comments: Gauze roll between teeth

## 2012-12-18 NOTE — Op Note (Signed)
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 Lorin Picket N[place] Lave  The patient has the above diagnoses and consented the above procedure. Preoperative antibiotics were given. Preoperative leg position was excellent  He had moderate meatal stenosis with a little bit of a contracted meatus and mild BXO. I did a retrograde urethrogram with the patient in lithotomy position. Penis is dry the left side. I cut a 6 [place] open-end ureteral catheter. He just inserted to the meatal stenosis and I could do a nice retrograde with 6 cc of contrast. The dye filled the bulbar and penile urethra and went to the sphincter into the bladder. The caliber of the urethra looked reasonably normal  With male sounds I gently dilated distally from 12-20 [place] with no bleeding.  I then cystoscoped the patient with a 49 [place] scope. He had panurethral stricture disease inspection the penile and distal bulbar urethra with multiple ring let's. The proximal few centimeters of the bulb looked recently healthy. Membranous urethra looked normal. Prostatic urethra looked normal. He grade 1/4 bladder trabeculation. Trigone and ureters were normal. I scanned the bladder twice and there is no mass or foreign body in the bladder keeping in mind a CT scan that I felt may have identified a shadow near the bladder  Sensor wire was easily passed into the bladder. Balloon dilation catheter was passed with its marker to the bulbar urethra. He was dilated to 55 [place] with 18 atmospheres of pressure for 5 minutes. Balloon dilation catheter was removed. I re\re cystoscoped him easily with the scope. Symptomatically in my opinion its the distal 2 cm or so of the urethra and the meatus causing his obstructive symptoms. Wire was removed. There was no  bleeding.  The bladder was emptied and patient was taken to recovery room. Distal calibration will be discussed

## 2012-12-18 NOTE — Anesthesia Postprocedure Evaluation (Signed)
  Anesthesia Post-op Note  Patient: Steven Montoya  Procedure(s) Performed: Procedure(s) (LRB): CYSTOSCOPY BALLOON DILATION WITH RETROGRADE URETHROGRAM (N/A)  Patient Location: PACU  Anesthesia Type: General  Level of Consciousness: awake and alert   Airway and Oxygen Therapy: Patient Spontanous Breathing  Post-op Pain: mild  Post-op Assessment: Post-op Vital signs reviewed, Patient's Cardiovascular Status Stable, Respiratory Function Stable, Patent Airway and No signs of Nausea or vomiting  Last Vitals:  Filed Vitals:   12/18/12 0900  BP: 108/73  Pulse: 72  Temp:   Resp: 14    Post-op Vital Signs: stable   Complications: No apparent anesthesia complications

## 2012-12-19 NOTE — H&P (Signed)
History of Present Illness  Steven Montoya had meatal stenosis when I saw him in February 2012 due to BXO . He had pan-urethral stricture disease on cystoscopy. I do not think he ever followed up. He saw [facility] on February 25 with gross hematuria for 3 days. A urine culture was negative.  Review of Systems: No other change in bowel or neurologic systems.  Urinalysis: Rare bacteria. He was here with a CT Scan and for likely a cystoscopy.  I reviewed Steven Montoya CT Scan today and the upper tracts looked normal. I hope the ureters were normal. I am not convinced that on the right side of the bladder that there is not a bladder shadow, that it was probably more of a bowel artifact. When he was cystoscoped in March 2012 under anesthesia, the bladder was normal.  Steven Montoya has significant flow symptoms. He sometimes sit to urinate. His flow is variable, though he was nonspecific. It is hurting in the penis. It does spray. I think there is no question, it is much more frequent during the day and he is getting up 3-4 times at night. He quit smoking in the past. He is having pain after intercourse and with walking.  There is no other modifying factors or associated signs or symptoms. There is no other aggravating or relieving factors. The symptoms are moderate in severity and persistent.  Past Medical History  Problems  1. History of Anxiety (Symptom) 300.00  2. History of Arthritis V13.4  3. History of Esophageal Reflux 530.81  Surgical History  Problems  1. History of Cystoscopy For Urethral Stricture  2. History of No Surgical Problems  Current Meds  1. No Reported Medications  Allergies  Non-Medication  1. Latex  Family History  Problems  1. Paternal history of Congestive Heart Failure  2. Family history of Death In The Family Father  11; heart failure  3. Family history of Family Health Status - Mother's Age  41  4. Family history of Family Health Status Children ___ Living Daughters  2  5. Family  history of Family Health Status Children ___ Living Sons  2  Social History  Problems  1. Caffeine Use  1  2. Marital History - Currently Married  3. Tobacco Use 305.1  smokes 1/2 ppd for 25 years  4. Unemployed  Denied  5. History of Alcohol Use  Results/Data  Urine [Data Includes: Last 1 Day]   02Apr2014   COLOR  YELLOW   APPEARANCE  CLEAR   SPECIFIC GRAVITY  1.025   pH  6.0   GLUCOSE  NEG mg/dL   BILIRUBIN  NEG   KETONE  NEG mg/dL   BLOOD  NEG   PROTEIN  30 mg/dL   UROBILINOGEN  0.2 mg/dL   NITRITE  NEG   LEUKOCYTE ESTERASE  NEG   SQUAMOUS EPITHELIAL/HPF  FEW   WBC  0-2 WBC/hpf   RBC  NONE SEEN RBC/hpf   BACTERIA  RARE   CRYSTALS  NONE SEEN   CASTS  NONE SEEN   Assessment  Assessed  1. 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 Urethral Stricture 598.9  Plan  Microscopic Hematuria (599.72)  1. Cysto Requested for: 02Apr2014  Discussion/Summary  I talked to Steven Montoya and I really believe he likely has recurrence of a stricture disease. We talked about a cystoscopy and balloon dilation.  We talked about balloon dilation in detail. Pros, cons, general surgical and anesthetic risks, and other  options including watchful waiting were discussed. He understands that dilation is generally successful in most cases but Streetman-term success rates are low. We talked about the risk of persistent, de novo, or worsening incontinence and voiding dysfunction. Risks were described but not limited to the discussion of injury to neighboring structures and soft tissues. Bleeding, infection, pain, erectile dysfunction, and spraying of urination were discussed. The risk of neuropathy was discussed as well as the usual post-operative course.  He consented to this. We will clear his bladder post dilation.  We will have to talk about prophylactic measures next time. I will rebaseline his stricture disease.  After a thorough review of the management options for the patient's  condition the patient  elected to proceed with surgical therapy as noted above. We have discussed the potential benefits and risks of the procedure, side effects of the proposed treatment, the likelihood of the patient achieving the goals of the procedure, and any potential problems that might occur during the procedure or recuperation. Informed consent has been obtained.

## 2013-07-25 ENCOUNTER — Encounter (HOSPITAL_COMMUNITY): Payer: Self-pay | Admitting: Emergency Medicine

## 2013-07-25 ENCOUNTER — Emergency Department (HOSPITAL_COMMUNITY): Payer: Medicaid Other

## 2013-07-25 ENCOUNTER — Emergency Department (HOSPITAL_COMMUNITY)
Admission: EM | Admit: 2013-07-25 | Discharge: 2013-07-25 | Disposition: A | Discharge status: Home / Self Care | Payer: Medicaid Other | Attending: Emergency Medicine | Admitting: Emergency Medicine

## 2013-07-25 DIAGNOSIS — Z87891 Personal history of nicotine dependence: Secondary | ICD-10-CM | POA: Insufficient documentation

## 2013-07-25 DIAGNOSIS — Z87448 Personal history of other diseases of urinary system: Secondary | ICD-10-CM | POA: Insufficient documentation

## 2013-07-25 DIAGNOSIS — K625 Hemorrhage of anus and rectum: Secondary | ICD-10-CM | POA: Insufficient documentation

## 2013-07-25 DIAGNOSIS — Z91040 Latex allergy status: Secondary | ICD-10-CM | POA: Insufficient documentation

## 2013-07-25 LAB — URINALYSIS, ROUTINE W REFLEX MICROSCOPIC
Glucose, UA: NEGATIVE mg/dL
Hgb urine dipstick: NEGATIVE
Ketones, ur: NEGATIVE mg/dL
Protein, ur: 30 mg/dL — AB

## 2013-07-25 LAB — CBC WITH DIFFERENTIAL/PLATELET
Basophils Absolute: 0 10*3/uL (ref 0.0–0.1)
HCT: 41.9 % (ref 39.0–52.0)
Lymphocytes Relative: 25 % (ref 12–46)
Monocytes Absolute: 0.5 10*3/uL (ref 0.1–1.0)
Neutro Abs: 5.4 10*3/uL (ref 1.7–7.7)
Platelets: 251 10*3/uL (ref 150–400)
RBC: 4.7 MIL/uL (ref 4.22–5.81)
RDW: 13.8 % (ref 11.5–15.5)
WBC: 7.9 10*3/uL (ref 4.0–10.5)

## 2013-07-25 LAB — COMPREHENSIVE METABOLIC PANEL
ALT: 16 U/L (ref 0–53)
AST: 18 U/L (ref 0–37)
Alkaline Phosphatase: 66 U/L (ref 39–117)
CO2: 26 mEq/L (ref 19–32)
Chloride: 106 mEq/L (ref 96–112)
GFR calc non Af Amer: >90 mL/min (ref >90)
Sodium: 140 mEq/L (ref 135–145)
Total Bilirubin: 0.7 mg/dL (ref 0.3–1.2)

## 2013-07-25 LAB — URINE MICROSCOPIC-ADD ON

## 2013-07-25 LAB — OCCULT BLOOD, POC DEVICE: Fecal Occult Bld: POSITIVE — AB

## 2013-07-25 MED ORDER — SODIUM CHLORIDE 0.9 % IV BOLUS (SEPSIS)
1000.0000 mL | INTRAVENOUS | Status: AC
  Administered 2013-07-25: 1000 mL via INTRAVENOUS

## 2013-07-25 MED ORDER — PANTOPRAZOLE SODIUM 20 MG PO TBEC: 20.0000 mg | DELAYED_RELEASE_TABLET | Freq: Every day | ORAL | Status: DC

## 2013-07-25 MED ORDER — HYDROMORPHONE HCL PF 1 MG/ML IJ SOLN
1.0000 mg | INTRAMUSCULAR | Status: AC
  Administered 2013-07-25: 1 mg via INTRAVENOUS
  Filled 2013-07-25: qty 1

## 2013-07-25 MED ORDER — IOHEXOL 300 MG/ML  SOLN
25.0000 mL | Freq: Once | INTRAMUSCULAR | Status: AC | PRN
  Administered 2013-07-25: 25 mL via ORAL

## 2013-07-25 MED ORDER — IOHEXOL 300 MG/ML  SOLN
100.0000 mL | Freq: Once | INTRAMUSCULAR | Status: AC | PRN
  Administered 2013-07-25: 100 mL via INTRAVENOUS

## 2013-07-25 MED ORDER — OXYCODONE-ACETAMINOPHEN 5-325 MG PO TABS: 1.0000 | ORAL_TABLET | Freq: Four times a day (QID) | ORAL | Status: DC | PRN

## 2013-07-25 NOTE — ED Provider Notes (Signed)
CSN: [id number]     Arrival date & time 07/25/13  1519 History   First MD Initiated Contact with Patient 07/25/13 1650     Chief Complaint  Patient presents with  . GI Bleeding  . Abdominal Pain   (Consider location/radiation/quality/duration/timing/severity/associated sxs/prior Treatment) Patient is a 37 y.o. male presenting with abdominal pain. The history is provided by the patient.  Abdominal Pain Pain location:  LLQ Pain quality: aching   Pain radiates to:  Does not radiate Pain severity:  Moderate Onset quality:  Gradual Duration:  3 weeks Timing:  Constant Progression:  Unchanged Chronicity:  New Relieved by:  Nothing Worsened by:  Nothing tried Ineffective treatments:  None tried Associated symptoms: no chest pain, no cough, no diarrhea, no dysuria, no fever, no hematuria, no nausea, no shortness of breath and no vomiting     Past Medical History  Diagnosis Date  . Chronic urethral stricture   . Urgency of urination   . Frequency of urination   . Urinary straining   . Nocturia   . Lower abdominal pain    Past Surgical History  Procedure Laterality Date  . Cysto/ retrograde pyelogram/ urethral dilation  11-05-2010   History reviewed. No pertinent family history. History  Substance Use Topics  . Smoking status: Former Smoker -- 0.50 packs/day for 4 years    Types: Cigarettes    Quit date: 10/10/2012  . Smokeless tobacco: Current User    Types: Chew  . Alcohol Use: No     Comment: QUIT 2013    Review of Systems  Constitutional: Negative for fever.  HENT: Negative for drooling and rhinorrhea.   Eyes: Negative for pain.  Respiratory: Negative for cough and shortness of breath.   Cardiovascular: Negative for chest pain and leg swelling.  Gastrointestinal: Positive for abdominal pain. Negative for nausea, vomiting and diarrhea.  Genitourinary: Negative for dysuria and hematuria.  Musculoskeletal: Negative for gait problem and neck pain.  Skin: Negative  for color change.  Neurological: Negative for numbness and headaches.  Hematological: Negative for adenopathy.  Psychiatric/Behavioral: Negative for behavioral problems.  All other systems reviewed and are negative.    Allergies  Latex and Adhesive  Home Medications   Current Outpatient Rx  Name  Route  Sig  Dispense  Refill  . ibuprofen (ADVIL,MOTRIN) 200 MG tablet   Oral   Take 400 mg by mouth every 6 (six) hours as needed for pain.          Marland Kitchen lip balm (CARMEX) ointment   Topical   Apply 1 application topically as needed for lip care or irritation.          Marland Kitchen oxyCODONE-acetaminophen (PERCOCET/ROXICET) 5-325 MG per tablet   Oral   Take 1 tablet by mouth once.          BP 122/79  Pulse 82  Temp(Src) 98.2 F (36.8 C) (Oral)  Resp 16  Wt 134 lb 9.6 oz (61.054 kg)  SpO2 99% Physical Exam  Nursing note and vitals reviewed. Constitutional: He is oriented to person, place, and time. He appears well-developed and well-nourished.  HENT:  Head: Normocephalic and atraumatic.  Right Ear: External ear normal.  Left Ear: External ear normal.  Nose: Nose normal.  Mouth/Throat: Oropharynx is clear and moist. No oropharyngeal exudate.  Eyes: Conjunctivae and EOM are normal. Pupils are equal, round, and reactive to light.  Neck: Normal range of motion. Neck supple.  Cardiovascular: Normal rate, regular rhythm, normal heart sounds and intact distal  pulses.  Exam reveals no gallop and no friction rub.   No murmur heard. Pulmonary/Chest: Effort normal and breath sounds normal. No respiratory distress. He has no wheezes.  Abdominal: Soft. Bowel sounds are normal. He exhibits no distension. There is tenderness (mild to mod LLQ pain). There is no rebound and no guarding.  Genitourinary:  Mild non-inflamed external hemorrhoids. Also several possibly verrucous growths in the peri-rectal area. No gross blood on rectal exam, brownish appearing stool.   Musculoskeletal: Normal range of  motion. He exhibits no edema and no tenderness.  Neurological: He is alert and oriented to person, place, and time.  Skin: Skin is warm and dry.  Psychiatric: He has a normal mood and affect. His behavior is normal.    ED Course  Procedures (including critical care time) Labs Review Labs Reviewed  COMPREHENSIVE METABOLIC PANEL - Abnormal; Notable for the following:    Glucose, Bld 123 (*)    All other components within normal limits  URINALYSIS, ROUTINE W REFLEX MICROSCOPIC - Abnormal; Notable for the following:    APPearance HAZY (*)    Protein, ur 30 (*)    Leukocytes, UA TRACE (*)    All other components within normal limits  URINE MICROSCOPIC-ADD ON - Abnormal; Notable for the following:    Squamous Epithelial / LPF FEW (*)    Bacteria, UA FEW (*)    Casts HYALINE CASTS (*)    All other components within normal limits  OCCULT BLOOD, POC DEVICE - Abnormal; Notable for the following:    Fecal Occult Bld POSITIVE (*)    All other components within normal limits  URINE CULTURE  CBC WITH DIFFERENTIAL  LIPASE, BLOOD   Imaging Review Ct Abdomen Pelvis W Contrast  07/25/2013   CLINICAL DATA:  Left lower quadrant discomfort and rectal bleeding further preceding 3 weeks  EXAM: CT ABDOMEN AND PELVIS WITH CONTRAST  TECHNIQUE: Multidetector CT imaging of the abdomen and pelvis was performed using the standard protocol following bolus administration of intravenous contrast.  CONTRAST:  OMNIPAQUE IOHEXOL 300 MG/ML SOLN intravenously. The patient also received oral contrast material  COMPARISON:  CT scan of the abdomen and pelvis dated November 29, 2012.  FINDINGS: The orally administered contrast has traversed much of the small bowel. It has not yet reached the cecum. The appearance of the small bowel loops does not suggest obstruction. There is stool and gas within the colon. There are scattered diverticula in the descending colon and in the rectosigmoid colon. Incomplete distention of the  left colon limits the sensitivity of the study in terms of detection of mural thickening. No intra-abdominal nor pelvic abscess or free fluid is demonstrated. A normal calibered gas-filled appendix is demonstrated on images 54 through 58 of series 2.  The liver exhibits no focal mass nor ductal dilation. The gallbladder is adequately distended with no evidence of stones or wall thickening or surrounding inflammatory change. The pancreas, spleen, partially distended stomach, adrenal glands, and kidneys again exhibit no acute abnormalities. There is a nonobstructing 2 mm diameter lower pole stone on the left. The caliber of the abdominal aorta is normal. The prostate gland and seminal vesicles appear normal. The partially distended urinary bladder is also normal in appearance. There is no inguinal nor umbilical hernia.  The lumbar vertebral bodies. Are preserved in height. The lung bases are clear.  IMPRESSION: 1. There is no evidence of bowel obstruction or ileus. There are descending colonic and sigmoid diverticula demonstrated without evidence of acute  diverticulitis. Subjective mild prominence of the wall of the descending colon is present which in the appropriate clinical setting could reflect colitis. There is no evidence of an intra-abdominal or pelvic abscess nor free fluid. 2. There is no evidence of acute hepatobiliary abnormality. 3. There is a nonobstructing 2 mm diameter lower pole stone in the left kidney.   Electronically Signed   By: David  [place]   On: 07/25/2013 19:20    EKG Interpretation   None       MDM   1. Rectal bleeding    5:41 PM 36 y.o. male who presents with bright red rectal bleeding for approximately 3 weeks. The patient notes that is usually with bowel movements but occasionally feels a trickle of blood down his leg with ambulation. He also notes constant left lower quadrant abdominal pain. He denies any fevers, vomiting, diarrhea. He is afebrile and vital signs are  unremarkable here. There is no gross blood on his rectal exam but it is Hemoccult positive. Will get CT of abdomen, pain control Dilaudid, IV fluids, and labs.  Hemeoccult pos.   9:09 PM: I interpreted/reviewed the labs and/or imaging which were non-contributory. Pt continues to appear well. His sx may be related to colitis vs internal hemorrhoids. As pt remains stable I think it is approp to have an outpt workup w/ GI.  I have discussed the diagnosis/risks/treatment options with the patient and believe the pt to be eligible for discharge home to follow-up with GI, call for appt. We also discussed returning to the ED immediately if new or worsening sx occur. We discussed the sx which are most concerning (e.g., worsening bloody stools, sob, cp, weakness, dizziness) that necessitate immediate return. Any new prescriptions provided to the patient are listed below.  Discharge Medication List as of 07/25/2013  9:11 PM    START taking these medications   Details  !! oxyCODONE-acetaminophen (PERCOCET) 5-325 MG per tablet Take 1 tablet by mouth every 6 (six) hours as needed., Starting 07/25/2013, Until Discontinued, Print    pantoprazole (PROTONIX) 20 MG tablet Take 1 tablet (20 mg total) by mouth daily., Starting 07/25/2013, Until Discontinued, Print     !! - Potential duplicate medications found. Please discuss with provider.       Junius Argyle, MD 07/26/13 [phone]

## 2013-07-25 NOTE — ED Notes (Signed)
Pt reports bright red rectal bleeding x 3 weeks, reports it has gotten worse and now constant and running down his leg. Having lower abd pain and cramping. No vomiting.

## 2013-07-25 NOTE — ED Notes (Signed)
Patient transported to CT 

## 2013-07-25 NOTE — ED Notes (Signed)
Pt unable to give urine sample at this time 

## 2013-07-25 NOTE — ED Notes (Signed)
CT notified pt done with contrast. 

## 2013-07-25 NOTE — ED Notes (Signed)
NOTIFIED DR. HARRISON IN PERSON OF PATIENT LAB RESULTS OF OCCULT BLOOS STOOL CARD = POSITIVE ( + ) RESULTS @17 :40PM, 07/25/2013.

## 2013-07-27 ENCOUNTER — Telehealth (HOSPITAL_COMMUNITY): Payer: Self-pay

## 2013-07-27 LAB — URINE CULTURE: Colony Count: NO GROWTH

## 2013-07-27 NOTE — ED Notes (Signed)
Pt calling about setting appt w/GI up.  Pt referred to incorrect on call provider for [facility])  Pt given contact info for [facility].  FM attempted to call office to help facilitate appt but office is closed today.  Pt advised to to return as needed for worsening symptoms.

## 2013-07-29 ENCOUNTER — Encounter (HOSPITAL_COMMUNITY): Payer: Self-pay | Admitting: Emergency Medicine

## 2013-07-29 ENCOUNTER — Emergency Department (HOSPITAL_COMMUNITY)
Admission: EM | Admit: 2013-07-29 | Discharge: 2013-07-29 | Disposition: A | Discharge status: Home / Self Care | Payer: Medicaid Other | Attending: Emergency Medicine | Admitting: Emergency Medicine

## 2013-07-29 DIAGNOSIS — Z87448 Personal history of other diseases of urinary system: Secondary | ICD-10-CM | POA: Insufficient documentation

## 2013-07-29 DIAGNOSIS — K644 Residual hemorrhoidal skin tags: Secondary | ICD-10-CM | POA: Insufficient documentation

## 2013-07-29 DIAGNOSIS — F17200 Nicotine dependence, unspecified, uncomplicated: Secondary | ICD-10-CM | POA: Insufficient documentation

## 2013-07-29 DIAGNOSIS — Z79899 Other long term (current) drug therapy: Secondary | ICD-10-CM | POA: Insufficient documentation

## 2013-07-29 DIAGNOSIS — Z91040 Latex allergy status: Secondary | ICD-10-CM | POA: Insufficient documentation

## 2013-07-29 LAB — CBC WITH DIFFERENTIAL/PLATELET
Basophils Absolute: 0 10*3/uL (ref 0.0–0.1)
Basophils Relative: 0 % (ref 0–1)
Eosinophils Absolute: 0.1 10*3/uL (ref 0.0–0.7)
Hemoglobin: 14.6 g/dL (ref 13.0–17.0)
Lymphocytes Relative: 20 % (ref 12–46)
MCH: 30.9 pg (ref 26.0–34.0)
MCHC: 34.9 g/dL (ref 30.0–36.0)
Monocytes Absolute: 0.5 10*3/uL (ref 0.1–1.0)
Monocytes Relative: 6 % (ref 3–12)
Neutro Abs: 6.5 10*3/uL (ref 1.7–7.7)
Neutrophils Relative %: 74 % (ref 43–77)
Platelets: 261 10*3/uL (ref 150–400)
RBC: 4.72 MIL/uL (ref 4.22–5.81)
RDW: 13.7 % (ref 11.5–15.5)
WBC: 8.8 10*3/uL (ref 4.0–10.5)

## 2013-07-29 LAB — COMPREHENSIVE METABOLIC PANEL
AST: 14 U/L (ref 0–37)
Albumin: 4 g/dL (ref 3.5–5.2)
Alkaline Phosphatase: 61 U/L (ref 39–117)
BUN: 17 mg/dL (ref 6–23)
Creatinine, Ser: 0.87 mg/dL (ref 0.50–1.35)
Glucose, Bld: 100 mg/dL — ABNORMAL HIGH (ref 70–99)
Potassium: 4.3 mEq/L (ref 3.5–5.1)
Total Protein: 7 g/dL (ref 6.0–8.3)

## 2013-07-29 MED ORDER — LIDOCAINE 4 % EX CREA: 1.0000 "application " | TOPICAL_CREAM | CUTANEOUS | Status: DC | PRN

## 2013-07-29 MED ORDER — OXYCODONE-ACETAMINOPHEN 5-325 MG PO TABS: 1.0000 | ORAL_TABLET | ORAL | Status: DC | PRN

## 2013-07-29 MED ORDER — OXYCODONE-ACETAMINOPHEN 5-325 MG PO TABS
1.0000 | ORAL_TABLET | Freq: Once | ORAL | Status: AC
  Administered 2013-07-29: 1 via ORAL
  Filled 2013-07-29: qty 1

## 2013-07-29 MED ORDER — HYDROCORTISONE 1 % EX CREA: TOPICAL_CREAM | CUTANEOUS | Status: DC

## 2013-07-29 NOTE — ED Notes (Signed)
Patient discharged to home with family. NAD.  

## 2013-07-29 NOTE — ED Provider Notes (Signed)
CSN: [id number]     Arrival date & time 07/29/13  1439 History   First MD Initiated Contact with Patient 07/29/13 1700     Chief Complaint  Patient presents with  . Rectal Bleeding   (Consider location/radiation/quality/duration/timing/severity/associated sxs/prior Treatment) HPI Steven Montoya is a 37 y.o. male who presents to emergency department complaining of rectal bleeding. Patient states that he has had rectal bleeding for 3-1/2 weeks now. He was seen in emergency Department 4 days ago when bleeding worsened. He had a full workup including CT scan which showed diverticulosis. He also was noted to have hemorrhoids. He was discharged home with Percocet and protonic which she's taking. States he ran out of Percocet this morning. Patient states that he has not followed up with gastroenterologist. States he thinks the bleeding is getting worse and states he has a lot of pain to the rectal area. States pain is worsened when he wipes. He denies any fever, chills, abdominal pain, nausea, vomiting. States bowel movement movements are normal except for blood around it.  Past Medical History  Diagnosis Date  . Chronic urethral stricture   . Urgency of urination   . Frequency of urination   . Urinary straining   . Nocturia   . Lower abdominal pain    Past Surgical History  Procedure Laterality Date  . Cysto/ retrograde pyelogram/ urethral dilation  11-05-2010   History reviewed. No pertinent family history. History  Substance Use Topics  . Smoking status: Current Every Day Smoker -- 0.50 packs/day for 4 years    Types: Cigarettes  . Smokeless tobacco: Current User    Types: Chew  . Alcohol Use: No     Comment: QUIT 2013    Review of Systems  Constitutional: Negative for fever and chills.  Respiratory: Negative for cough, chest tightness and shortness of breath.   Cardiovascular: Negative for chest pain, palpitations and leg swelling.  Gastrointestinal: Positive for blood in stool  and rectal pain. Negative for nausea, vomiting, abdominal pain, diarrhea, constipation and abdominal distention.  Genitourinary: Negative for dysuria, urgency, frequency and hematuria.  Musculoskeletal: Negative for arthralgias, myalgias, neck pain and neck stiffness.  Skin: Negative for rash.  Allergic/Immunologic: Negative for immunocompromised state.  Neurological: Negative for dizziness, weakness, light-headedness, numbness and headaches.    Allergies  Latex and Adhesive  Home Medications   Current Outpatient Rx  Name  Route  Sig  Dispense  Refill  . ibuprofen (ADVIL,MOTRIN) 200 MG tablet   Oral   Take 400 mg by mouth every 6 (six) hours as needed for pain.          Marland Kitchen lip balm (CARMEX) ointment   Topical   Apply 1 application topically as needed for lip care or irritation.          Marland Kitchen oxyCODONE-acetaminophen (PERCOCET) 5-325 MG per tablet   Oral   Take 1 tablet by mouth every 6 (six) hours as needed.   20 tablet   0   . pantoprazole (PROTONIX) 20 MG tablet   Oral   Take 1 tablet (20 mg total) by mouth daily.   30 tablet   0    BP 120/73  Pulse 79  Temp(Src) 98 F (36.7 C) (Oral)  Resp 20  Wt 135 lb 1.6 oz (61.281 kg)  SpO2 98% Physical Exam  Nursing note and vitals reviewed. Constitutional: He is oriented to person, place, and time. He appears well-developed and well-nourished. No distress.  HENT:  Head: Normocephalic and atraumatic.  Eyes: Conjunctivae are normal.  Neck: Normal range of motion. Neck supple.  Cardiovascular: Normal rate, regular rhythm and normal heart sounds.   Pulmonary/Chest: Effort normal and breath sounds normal. No respiratory distress. He has no wheezes. He has no rales.  Abdominal: Soft. Bowel sounds are normal. He exhibits no distension. There is no tenderness.  Genitourinary:  Soft external hemorrhoids one of which is erythematous, tender to palpation, enlarged. No signs of thrombosis. No active bleeding.  Musculoskeletal: He  exhibits no edema and no tenderness.  Lymphadenopathy:    He has no cervical adenopathy.  Neurological: He is alert and oriented to person, place, and time.  Skin: Skin is warm and dry. No erythema.  Psychiatric: He has a normal mood and affect.    ED Course  Procedures (including critical care time) Labs Review Labs Reviewed  COMPREHENSIVE METABOLIC PANEL - Abnormal; Notable for the following:    Glucose, Bld 100 (*)    Total Bilirubin 0.2 (*)    All other components within normal limits  CBC WITH DIFFERENTIAL  URINALYSIS, ROUTINE W REFLEX MICROSCOPIC   Imaging Review No results found. CLINICAL DATA: Left lower quadrant discomfort and rectal bleeding  further preceding 3 weeks  EXAM:  CT ABDOMEN AND PELVIS WITH CONTRAST  TECHNIQUE:  Multidetector CT imaging of the abdomen and pelvis was performed  using the standard protocol following bolus administration of  intravenous contrast.  CONTRAST: OMNIPAQUE IOHEXOL 300 MG/ML SOLN intravenously. The  patient also received oral contrast material  COMPARISON: CT scan of the abdomen and pelvis dated November 29, 2012.  FINDINGS:  The orally administered contrast has traversed much of the small  bowel. It has not yet reached the cecum. The appearance of the small  bowel loops does not suggest obstruction. There is stool and gas  within the colon. There are scattered diverticula in the descending  colon and in the rectosigmoid colon. Incomplete distention of the  left colon limits the sensitivity of the study in terms of detection  of mural thickening. No intra-abdominal nor pelvic abscess or free  fluid is demonstrated. A normal calibered gas-filled appendix is  demonstrated on images 54 through 58 of series 2.  The liver exhibits no focal mass nor ductal dilation. The  gallbladder is adequately distended with no evidence of stones or  wall thickening or surrounding inflammatory change. The pancreas,  spleen, partially distended  stomach, adrenal glands, and kidneys  again exhibit no acute abnormalities. There is a nonobstructing 2 mm  diameter lower pole stone on the left. The caliber of the abdominal  aorta is normal. The prostate gland and seminal vesicles appear  normal. The partially distended urinary bladder is also normal in  appearance. There is no inguinal nor umbilical hernia.  The lumbar vertebral bodies. Are preserved in height. The lung bases  are clear.  IMPRESSION:  1. There is no evidence of bowel obstruction or ileus. There are  descending colonic and sigmoid diverticula demonstrated without  evidence of acute diverticulitis. Subjective mild prominence of the  wall of the descending colon is present which in the appropriate  clinical setting could reflect colitis. There is no evidence of an  intra-abdominal or pelvic abscess nor free fluid.  2. There is no evidence of acute hepatobiliary abnormality.  3. There is a nonobstructing 2 mm diameter lower pole stone in the  left kidney.  Electronically Signed  By: David [place]  On: 07/25/2013 19:20  EKG Interpretation   None  MDM   1. External hemorrhoids     Patient is back to emergency department 4 days after being evaluated here. CT findings at bedtime as above. Patient denies any abdominal pain at this time and he has no abdominal tenderness on exam. I do not think this is colitis. He does have painful external hemorrhoids on exam but no active bleeding. There is no signs of thrombosed hemorrhoids. He has not been in treatment for his hemorrhoids at this time. I will prescribe him more pain medicine, will start sitz bath, hydrocortisone cream, lidocaine cream, and followup with gastroenterologist. His hemoglobin today is normal. He is in no distress. Vital signs are normal.  Filed Vitals:   07/29/13 1445  BP: 120/73  Pulse: 79  Temp: 98 F (36.7 C)  TempSrc: Oral  Resp: 20  Weight: 135 lb 1.6 oz (61.281 kg)  SpO2: 98%      Arbie Blankley A Willia Lampert, PA-C 07/29/13 2014

## 2013-07-29 NOTE — ED Notes (Addendum)
Pt reports he was seen here Wednesday for rectal bleeding and was discharged home. States he was told his test here were normal. States today he noticed more blood in his stool so he took a picture and brought a sample with him. States he has been losing weight and has been having back and abd pain. States he ran out of pain meds this am

## 2013-07-31 NOTE — ED Provider Notes (Signed)
  Medical screening examination/treatment/procedure(s) were performed by non-physician practitioner and as supervising physician I was immediately available for consultation/collaboration.  EKG Interpretation   None          Gerhard Munch, MD 07/31/13 [phone]

## 2013-08-07 ENCOUNTER — Encounter (INDEPENDENT_AMBULATORY_CARE_PROVIDER_SITE_OTHER): Payer: Self-pay | Admitting: General Surgery

## 2013-08-07 ENCOUNTER — Encounter (HOSPITAL_COMMUNITY): Payer: Self-pay | Admitting: Pharmacy Technician

## 2013-08-07 ENCOUNTER — Encounter (HOSPITAL_COMMUNITY): Payer: Self-pay | Admitting: *Deleted

## 2013-08-07 ENCOUNTER — Ambulatory Visit (INDEPENDENT_AMBULATORY_CARE_PROVIDER_SITE_OTHER): Payer: Medicaid Other | Admitting: General Surgery

## 2013-08-07 VITALS — BP 110/70 | HR 72 | Temp 98.9°F | Resp 14 | Ht 68.0 in | Wt 137.8 lb

## 2013-08-07 DIAGNOSIS — R1032 Left lower quadrant pain: Secondary | ICD-10-CM

## 2013-08-07 DIAGNOSIS — R634 Abnormal weight loss: Secondary | ICD-10-CM

## 2013-08-07 DIAGNOSIS — K625 Hemorrhage of anus and rectum: Secondary | ICD-10-CM

## 2013-08-07 DIAGNOSIS — K6289 Other specified diseases of anus and rectum: Secondary | ICD-10-CM

## 2013-08-07 DIAGNOSIS — K649 Unspecified hemorrhoids: Secondary | ICD-10-CM

## 2013-08-07 MED ORDER — LIDOCAINE 4 % EX CREA: 1.0000 "application " | TOPICAL_CREAM | CUTANEOUS | Status: DC | PRN

## 2013-08-07 MED ORDER — HYDROCORTISONE 2.5 % RE CREA: 1.0000 "application " | TOPICAL_CREAM | Freq: Two times a day (BID) | RECTAL | Status: DC

## 2013-08-07 MED ORDER — LIDOCAINE 5 % EX OINT: 1.0000 "application " | TOPICAL_OINTMENT | CUTANEOUS | Status: DC | PRN

## 2013-08-07 NOTE — Progress Notes (Signed)
Chief Complaint  Patient presents with  . New Evaluation    eval hems    HISTORY: Steven Montoya is a 37 y.o. male who presents to the office with rectal bleeding.  Other symptoms include abd pain.  This had been occurring for about a month.  He has tried lidocaine, sitz baths and hem pads in the past with some success.  Nothing makes the symptoms worse.   It is continuous in nature.  His bowel habits are regular and his bowel movements are loose with laxatives.  His fiber intake is dietary.  He has never had colonoscopy.      Past Medical History  Diagnosis Date  . Chronic urethral stricture   . Urgency of urination   . Frequency of urination   . Urinary straining   . Nocturia   . Lower abdominal pain       Past Surgical History  Procedure Laterality Date  . Cysto/ retrograde pyelogram/ urethral dilation  11-05-2010        Current Outpatient Prescriptions  Medication Sig Dispense Refill  . HYDROcodone-acetaminophen (NORCO/VICODIN) 5-325 MG per tablet Take 1 tablet by mouth every 6 (six) hours as needed for moderate pain.      Marland Kitchen ibuprofen (ADVIL,MOTRIN) 200 MG tablet Take 400 mg by mouth every 6 (six) hours as needed for pain.       Marland Kitchen lip balm (CARMEX) ointment Apply 1 application topically as needed for lip care or irritation.       Steven Montoya LIQD Apply topically.      . hydrocortisone cream 1 % Apply to affected area 2 times daily  15 g  0  . lidocaine (LMX) 4 % cream Apply 1 application topically as needed.  30 g  0  . oxyCODONE-acetaminophen (PERCOCET) 5-325 MG per tablet Take 1 tablet by mouth every 6 (six) hours as needed.  20 tablet  0  . oxyCODONE-acetaminophen (PERCOCET) 5-325 MG per tablet Take 1 tablet by mouth every 4 (four) hours as needed for severe pain.  20 tablet  0  . pantoprazole (PROTONIX) 20 MG tablet Take 1 tablet (20 mg total) by mouth daily.  30 tablet  0   No current facility-administered medications for this visit.      Allergies  Allergen Reactions   . Latex Other (See Comments)    HX SUPERFICIAL THROMBOPHLEBITIS LEFT FOREARM AFTER IV IN APR 2011  . Adhesive [Tape] Rash      Family History  Problem Relation Age of Onset  . Heart disease Father     History   Social History  . Marital Status: Married    Spouse Name: N/A    Number of Children: N/A  . Years of Education: N/A   Social History Main Topics  . Smoking status: Current Every Day Smoker -- 0.50 packs/day for 4 years    Types: Cigarettes  . Smokeless tobacco: Current User    Types: Chew  . Alcohol Use: No     Comment: QUIT 2013  . Drug Use: Yes    Special: Marijuana     Comment: OCCASIONAL  . Sexual Activity: None   Other Topics Concern  . None   Social History Narrative  . None      REVIEW OF SYSTEMS - PERTINENT POSITIVES ONLY: Review of Systems - General ROS: negative for - chills, fever, pos for weight loss Hematological and Lymphatic ROS: negative for - bleeding problems, blood clots or bruising Respiratory ROS: no cough,  shortness of breath, or wheezing Cardiovascular ROS: no chest pain or dyspnea on exertion Gastrointestinal ROS: positive for - abdominal pain and diarrhea negative for - nausea/vomiting Genito-Urinary ROS: no dysuria, trouble voiding, or hematuria  EXAM: Filed Vitals:   08/07/13 0930  BP: 110/70  Pulse: 72  Temp: 98.9 F (37.2 C)  Resp: 14    General appearance: alert and cooperative Resp: clear to auscultation bilaterally Cardio: regular rate and rhythm GI: normal findings: no masses palpable and abnormal findings:  LLQ tenderness  Findings: edematous external hemorrhoids, tender to touch, unable to do DRE or anoscopy due to pain    ASSESSMENT AND PLAN: Steven Montoya is a 37 year old male who has approximately one-month history of abdominal pain and rectal bleeding. He reports approximately a 20 pound weight loss during this time as well. On exam he has some inflamed external hemorrhoids and blood noted externally as  well. He is complaining of leakage and incontinence. His most recent CT scan also shows some slight swelling in the descending colon.  Given all of these signs and symptoms, I am concerned that there may be more going on than just hemorrhoids. I will like to do a colonoscopy to evaluate this further. I have given him Anusol cream and lidocaine cream for his anal pain. I have also instructed him to use sitz baths 3-4 times a day. We will try to get him on the schedule as soon as possible for the colonoscopy.    Steven Panda, MD Colon and Rectal Surgery / General Surgery [facility]      Visit Diagnoses: No diagnosis found.  Primary Care Physician: No PCP Per Patient

## 2013-08-07 NOTE — Patient Instructions (Signed)
[facility]  ONE-DAY (1) PRE-OP HOME COLON PREP INSTRUCTIONS: ** MIRALAX / GATORADE PREP **  You must follow the instructions below carefully.  If you have questions or problems, please call and speak to someone in the clinic department at our office:   [phone].     INSTRUCTIONS: 1. Five days prior to your procedure do not eat nuts, popcorn, or fruit with seeds.  Stop all fiber supplements such as Metamucil, Citrucel, etc. 2. Two days before surgery fill the prescription at a pharmacy of your choice and purchase the additional supplies below.         MIRALAX - GATORADE -- DULCOLAX TABS:   Purchase a bottle of MIRALAX  (255 gm bottle)    In addition, purchase four (4) DULCOLAX TABLETS (no prescription required- ask the pharmacist if you can't find them)    Purchase one 64 oz GATORADE.  (Do NOT purchase red Gatorade; any other flavor is acceptable) and place in refrigerator to get cold.  3.   Day Before Surgery:   6 am: take the 4 Dulcolax tablets   You may only have clear liquids (tea, coffee, juice, broth, jello, soft drinks, gummy bears).  You cannot have solid foods, cream, milk or milk products.  Drink at lease 8 ounces of liquids every hour while awake.   Mix the entire bottle of MiraLax and the Gatorade in a large container.    10:00am: Begin drinking the Gatorade mixture until gone (8 oz every 15-30 minutes).      You may suck on a lime wedge or hard candy to "freshen your palate" in between glasses   If you are a diabetic, take your blood sugar reading several time throughout the prep.  Have some juice available to take if your sugar level gets too low   You may feel chilled while taking the prep.  Have some warm tea or broth to help warm up.   Continue clear liquids until midnight or bedtime  3. The day of your procedure:   Do not eat or drink ANYTHING after midnight before your surgery.     If you take Heart or Blood Pressure medicine, ask the pre-op nurses about  these during your preop appointment.   Further pre-operative instructions will be given to you from the hospital.   Expect to be contacted 5-7 days before your surgery.  

## 2013-08-16 ENCOUNTER — Telehealth (INDEPENDENT_AMBULATORY_CARE_PROVIDER_SITE_OTHER): Payer: Self-pay | Admitting: *Deleted

## 2013-08-16 ENCOUNTER — Ambulatory Visit (INDEPENDENT_AMBULATORY_CARE_PROVIDER_SITE_OTHER): Payer: Medicaid Other | Admitting: General Surgery

## 2013-08-16 NOTE — Telephone Encounter (Signed)
That is the cheapest prep available.  If you get the generic brands, it is less that $10.

## 2013-08-16 NOTE — Telephone Encounter (Signed)
Patient's girlfriend called to state that patient went and was able to get his antibiotics that were prescribed however patient is unable to afford the Dulcolax and Miralax for the bowel prep.  Explained that we don't have those here and can't give him anything.  Explained that he has to have the bowel prep or they won't be able to see anything on the colonoscopy.  Girlfriend states that she is going to try to work on it and see what they can do to get it.  Explained for them to give [place] a call back to let us know if patient is unable to do the bowel prep so other arrangements can be made.  Girlfriend states understanding.

## 2013-08-17 ENCOUNTER — Ambulatory Visit (HOSPITAL_COMMUNITY): Payer: Medicaid Other | Admitting: Anesthesiology

## 2013-08-17 ENCOUNTER — Encounter (HOSPITAL_COMMUNITY): Payer: Medicaid Other | Admitting: Anesthesiology

## 2013-08-17 ENCOUNTER — Ambulatory Visit (HOSPITAL_COMMUNITY)
Admission: RE | Admit: 2013-08-17 | Discharge: 2013-08-17 | Disposition: A | Discharge status: Home / Self Care | Payer: Medicaid Other | Source: Ambulatory Visit | Attending: General Surgery | Admitting: General Surgery

## 2013-08-17 ENCOUNTER — Encounter (HOSPITAL_COMMUNITY)
Admission: RE | Disposition: A | Discharge status: Home / Self Care | Payer: Self-pay | Source: Ambulatory Visit | Attending: General Surgery

## 2013-08-17 ENCOUNTER — Encounter (HOSPITAL_COMMUNITY): Payer: Self-pay

## 2013-08-17 DIAGNOSIS — R109 Unspecified abdominal pain: Secondary | ICD-10-CM | POA: Insufficient documentation

## 2013-08-17 DIAGNOSIS — F17200 Nicotine dependence, unspecified, uncomplicated: Secondary | ICD-10-CM | POA: Insufficient documentation

## 2013-08-17 DIAGNOSIS — R197 Diarrhea, unspecified: Secondary | ICD-10-CM | POA: Insufficient documentation

## 2013-08-17 DIAGNOSIS — K625 Hemorrhage of anus and rectum: Secondary | ICD-10-CM | POA: Insufficient documentation

## 2013-08-17 DIAGNOSIS — R51 Headache: Secondary | ICD-10-CM | POA: Insufficient documentation

## 2013-08-17 DIAGNOSIS — R634 Abnormal weight loss: Secondary | ICD-10-CM | POA: Insufficient documentation

## 2013-08-17 HISTORY — DX: Headache: R51

## 2013-08-17 HISTORY — PX: COLONOSCOPY: SHX5424

## 2013-08-17 SURGERY — COLONOSCOPY
Anesthesia: Monitor Anesthesia Care

## 2013-08-17 MED ORDER — FENTANYL CITRATE 0.05 MG/ML IJ SOLN
25.0000 ug | INTRAMUSCULAR | Status: DC | PRN
  Administered 2013-08-17 (×4): 25 ug via INTRAVENOUS

## 2013-08-17 MED ORDER — FENTANYL CITRATE 0.05 MG/ML IJ SOLN
INTRAMUSCULAR | Status: DC | PRN
  Administered 2013-08-17: 50 ug via INTRAVENOUS

## 2013-08-17 MED ORDER — PROPOFOL INFUSION 10 MG/ML OPTIME
INTRAVENOUS | Status: DC | PRN
  Administered 2013-08-17: 120 ug/kg/min via INTRAVENOUS

## 2013-08-17 MED ORDER — SODIUM CHLORIDE 0.9 % IV SOLN
INTRAVENOUS | Status: DC
  Administered 2013-08-17: 12:00:00 via INTRAVENOUS

## 2013-08-17 MED ORDER — FENTANYL CITRATE 0.05 MG/ML IJ SOLN
INTRAMUSCULAR | Status: AC
  Filled 2013-08-17: qty 2

## 2013-08-17 MED ORDER — MIDAZOLAM HCL 2 MG/2ML IJ SOLN
INTRAMUSCULAR | Status: AC
  Filled 2013-08-17: qty 2

## 2013-08-17 MED ORDER — PROPOFOL 10 MG/ML IV BOLUS
INTRAVENOUS | Status: AC
  Filled 2013-08-17: qty 20

## 2013-08-17 MED ORDER — FENTANYL CITRATE 0.05 MG/ML IJ SOLN: 50.0000 ug | INTRAMUSCULAR | Status: DC | PRN

## 2013-08-17 MED ORDER — KETAMINE HCL 50 MG/ML IJ SOLN
INTRAMUSCULAR | Status: AC
  Filled 2013-08-17: qty 10

## 2013-08-17 MED ORDER — MIDAZOLAM HCL 5 MG/5ML IJ SOLN
INTRAMUSCULAR | Status: DC | PRN
  Administered 2013-08-17: 1 mg via INTRAVENOUS
  Administered 2013-08-17: 2 mg via INTRAVENOUS
  Administered 2013-08-17: 1 mg via INTRAVENOUS

## 2013-08-17 MED ORDER — LIDOCAINE HCL (CARDIAC) 20 MG/ML IV SOLN
INTRAVENOUS | Status: AC
  Filled 2013-08-17: qty 5

## 2013-08-17 MED ORDER — LACTATED RINGERS IV SOLN
INTRAVENOUS | Status: DC
  Administered 2013-08-17: 1000 mL via INTRAVENOUS

## 2013-08-17 MED ORDER — KETAMINE HCL 10 MG/ML IJ SOLN
INTRAMUSCULAR | Status: DC | PRN
  Administered 2013-08-17: 10 mg via INTRAVENOUS
  Administered 2013-08-17 (×2): 20 mg via INTRAVENOUS

## 2013-08-17 NOTE — Op Note (Addendum)
[facility] [address] [place], [address]   COLONOSCOPY PROCEDURE REPORT  PATIENT: Steven Montoya, Steven Montoya  MR#: [id number] BIRTHDATE: 03-Oct-1975 , 36  yrs. old GENDER: Male ENDOSCOPIST: Vanita Panda, MD REFERRED BY: PROCEDURE DATE:  08/17/2013 PROCEDURE:   Colonoscopy, diagnostic ASA CLASS:   Class II INDICATIONS:unexplained diarrhea, Rectal Bleeding, Weight loss, and Abdominal pain. MEDICATIONS: MAC sedation, administered by CRNA  DESCRIPTION OF PROCEDURE:   After the risks benefits and alternatives of the procedure were thoroughly explained, informed consent was obtained.  A digital rectal exam revealed internal hemorrhoids.   The   endoscope was introduced through the anus and advanced to the terminal ileum which was intubated for a short distance. No adverse events experienced.   The quality of the prep was excellent, using MiraLax  The instrument was then slowly withdrawn as the colon was fully examined.      COLON FINDINGS: There was no evidence of mucosal inflammation.   A normal appearing cecum, ileocecal valve, and appendiceal orifice were identified.  The ascending, hepatic flexure, transverse, splenic flexure, descending, sigmoid colon and rectum appeared unremarkable.  No polyps or cancers were seen.  Multiple biopsies of the colon were performed.  These were taken in the terminal ileum, right colon and left colon to evaluate for microscopic colitis.  Retroflexed views revealed internal hemorrhoids.  The scope was withdrawn and the procedure completed.   WITHDRAWAL TIME:11 minutes 0 seconds COMPLICATIONS: There were no complications. ENDOSCOPIC IMPRESSION: 1.   There was no evidence of mucosal inflammation 2.   Normal colon  RECOMMENDATIONS: Await biopsy results eSigned:  Vanita Panda, MD 08/17/2013 12:38 PM Revised: 08/17/2013 12:38 PM  cc:

## 2013-08-17 NOTE — H&P (View-Only) (Signed)
Chief Complaint  Patient presents with  . New Evaluation    eval hems    HISTORY: Steven Montoya is a 36 y.o. male who presents to the office with rectal bleeding.  Other symptoms include abd pain.  This had been occurring for about a month.  He has tried lidocaine, sitz baths and hem pads in the past with some success.  Nothing makes the symptoms worse.   It is continuous in nature.  His bowel habits are regular and his bowel movements are loose with laxatives.  His fiber intake is dietary.  He has never had colonoscopy.      Past Medical History  Diagnosis Date  . Chronic urethral stricture   . Urgency of urination   . Frequency of urination   . Urinary straining   . Nocturia   . Lower abdominal pain       Past Surgical History  Procedure Laterality Date  . Cysto/ retrograde pyelogram/ urethral dilation  11-05-2010        Current Outpatient Prescriptions  Medication Sig Dispense Refill  . HYDROcodone-acetaminophen (NORCO/VICODIN) 5-325 MG per tablet Take 1 tablet by mouth every 6 (six) hours as needed for moderate pain.      . ibuprofen (ADVIL,MOTRIN) 200 MG tablet Take 400 mg by mouth every 6 (six) hours as needed for pain.       . lip balm (CARMEX) ointment Apply 1 application topically as needed for lip care or irritation.       . [place] Hazel LIQD Apply topically.      . hydrocortisone cream 1 % Apply to affected area 2 times daily  15 g  0  . lidocaine (LMX) 4 % cream Apply 1 application topically as needed.  30 g  0  . oxyCODONE-acetaminophen (PERCOCET) 5-325 MG per tablet Take 1 tablet by mouth every 6 (six) hours as needed.  20 tablet  0  . oxyCODONE-acetaminophen (PERCOCET) 5-325 MG per tablet Take 1 tablet by mouth every 4 (four) hours as needed for severe pain.  20 tablet  0  . pantoprazole (PROTONIX) 20 MG tablet Take 1 tablet (20 mg total) by mouth daily.  30 tablet  0   No current facility-administered medications for this visit.      Allergies  Allergen Reactions   . Latex Other (See Comments)    HX SUPERFICIAL THROMBOPHLEBITIS LEFT FOREARM AFTER IV IN APR 2011  . Adhesive [Tape] Rash      Family History  Problem Relation Age of Onset  . Heart disease Father     History   Social History  . Marital Status: Married    Spouse Name: N/A    Number of Children: N/A  . Years of Education: N/A   Social History Main Topics  . Smoking status: Current Every Day Smoker -- 0.50 packs/day for 4 years    Types: Cigarettes  . Smokeless tobacco: Current User    Types: Chew  . Alcohol Use: No     Comment: QUIT 2013  . Drug Use: Yes    Special: Marijuana     Comment: OCCASIONAL  . Sexual Activity: None   Other Topics Concern  . None   Social History Narrative  . None      REVIEW OF SYSTEMS - PERTINENT POSITIVES ONLY: Review of Systems - General ROS: negative for - chills, fever, pos for weight loss Hematological and Lymphatic ROS: negative for - bleeding problems, blood clots or bruising Respiratory ROS: no cough,   shortness of breath, or wheezing Cardiovascular ROS: no chest pain or dyspnea on exertion Gastrointestinal ROS: positive for - abdominal pain and diarrhea negative for - nausea/vomiting Genito-Urinary ROS: no dysuria, trouble voiding, or hematuria  EXAM: Filed Vitals:   08/07/13 0930  BP: 110/70  Pulse: 72  Temp: 98.9 F (37.2 C)  Resp: 14    General appearance: alert and cooperative Resp: clear to auscultation bilaterally Cardio: regular rate and rhythm GI: normal findings: no masses palpable and abnormal findings:  LLQ tenderness  Findings: edematous external hemorrhoids, tender to touch, unable to do DRE or anoscopy due to pain    ASSESSMENT AND PLAN: Steven Montoya is a 36-year-old male who has approximately one-month history of abdominal pain and rectal bleeding. He reports approximately a 20 pound weight loss during this time as well. On exam he has some inflamed external hemorrhoids and blood noted externally as  well. He is complaining of leakage and incontinence. His most recent CT scan also shows some slight swelling in the descending colon.  Given all of these signs and symptoms, I am concerned that there may be more going on than just hemorrhoids. I will like to do a colonoscopy to evaluate this further. I have given him Anusol cream and lidocaine cream for his anal pain. I have also instructed him to use sitz baths 3-4 times a day. We will try to get him on the schedule as soon as possible for the colonoscopy.    Sosie Gato C Loyd Marhefka, MD Colon and Rectal Surgery / General Surgery [facility]      Visit Diagnoses: No diagnosis found.  Primary Care Physician: No PCP Per Patient   

## 2013-08-17 NOTE — Interval H&P Note (Signed)
History and Physical Interval Note:  08/17/2013 11:14 AM  Steven Montoya  has presented today for surgery, with the diagnosis of abdominal pain rectal pain   The various methods of treatment have been discussed with the patient and family. After consideration of risks, benefits and other options for treatment, the patient has consented to  Procedure(s): COLONOSCOPY (N/A) as a surgical intervention .  The patient's history has been reviewed, patient examined, no change in status, stable for surgery.  I have reviewed the patient's chart and labs.  Questions were answered to the patient's satisfaction.     Vanita Panda, MD  Colorectal and General Surgery [facility]

## 2013-08-17 NOTE — Anesthesia Postprocedure Evaluation (Signed)
  Anesthesia Post-op Note  Patient: Steven Montoya  Procedure(s) Performed: Procedure(s) (LRB): COLONOSCOPY (N/A)  Patient Location: PACU  Anesthesia Type: MAC  Level of Consciousness: awake and alert   Airway and Oxygen Therapy: Patient Spontanous Breathing  Post-op Pain: mild  Post-op Assessment: Post-op Vital signs reviewed, Patient's Cardiovascular Status Stable, Respiratory Function Stable, Patent Airway and No signs of Nausea or vomiting  Last Vitals:  Filed Vitals:   08/17/13 1243  BP: 120/67  Pulse:   Temp:   Resp: 27    Post-op Vital Signs: stable   Complications: No apparent anesthesia complications

## 2013-08-17 NOTE — Anesthesia Preprocedure Evaluation (Addendum)
Anesthesia Evaluation  Patient identified by MRN, date of birth, ID band Patient awake    Reviewed: Allergy & Precautions, H&P , NPO status , Patient's Chart, lab work & pertinent test results  Airway Mallampati: II TM Distance: >3 FB Neck ROM: full    Dental no notable dental hx. (+) Teeth Intact and Dental Advisory Given   Pulmonary Current Smoker,  breath sounds clear to auscultation  Pulmonary exam normal       Cardiovascular Exercise Tolerance: Good negative cardio ROS  Rhythm:regular Rate:Normal     Neuro/Psych  Headaches, negative neurological ROS  negative psych ROS   GI/Hepatic negative GI ROS, Neg liver ROS,   Endo/Other  negative endocrine ROS  Renal/GU negative Renal ROS  negative genitourinary   Musculoskeletal   Abdominal   Peds  Hematology negative hematology ROS (+)   Anesthesia Other Findings   Reproductive/Obstetrics negative OB ROS                          Anesthesia Physical Anesthesia Plan  ASA: II  Anesthesia Plan: MAC   Post-op Pain Management:    Induction:   Airway Management Planned: Simple Face Mask  Additional Equipment:   Intra-op Plan:   Post-operative Plan:   Informed Consent: I have reviewed the patients History and Physical, chart, labs and discussed the procedure including the risks, benefits and alternatives for the proposed anesthesia with the patient or authorized representative who has indicated his/her understanding and acceptance.   Dental Advisory Given  Plan Discussed with: CRNA and Surgeon  Anesthesia Plan Comments:         Anesthesia Quick Evaluation

## 2013-08-17 NOTE — Progress Notes (Addendum)
IV analgesic given as ordered per Dr. Leta Jungling.  Having severe abdominal pain.  Will reevaluate in 5 - 10 minutes. Pain medication repeated X1.  States first dose did nothing.  Still lying awake complaining of abdominal pain.  Total dose Fentanyl given.

## 2013-08-17 NOTE — Transfer of Care (Signed)
Immediate Anesthesia Transfer of Care Note  Patient: Steven Montoya  Procedure(s) Performed: Procedure(s): COLONOSCOPY (N/A)  Patient Location: PACU and Endoscopy Unit  Anesthesia Type:MAC  Level of Consciousness: sedated and responds to stimulation  Airway & Oxygen Therapy: Patient Spontanous Breathing and Patient connected to face mask oxygen  Post-op Assessment: Report given to PACU RN and Post -op Vital signs reviewed and stable  Post vital signs: Reviewed and stable  Complications: No apparent anesthesia complications

## 2013-08-20 ENCOUNTER — Encounter (HOSPITAL_COMMUNITY): Payer: Self-pay | Admitting: General Surgery

## 2013-10-14 ENCOUNTER — Encounter (HOSPITAL_COMMUNITY): Payer: Self-pay | Admitting: Emergency Medicine

## 2013-10-14 ENCOUNTER — Emergency Department (INDEPENDENT_AMBULATORY_CARE_PROVIDER_SITE_OTHER)
Admission: EM | Admit: 2013-10-14 | Discharge: 2013-10-14 | Disposition: A | Discharge status: Home / Self Care | Payer: Medicaid Other | Source: Home / Self Care | Attending: Family Medicine | Admitting: Family Medicine

## 2013-10-14 ENCOUNTER — Emergency Department (HOSPITAL_COMMUNITY)
Admission: EM | Admit: 2013-10-14 | Discharge: 2013-10-14 | Disposition: A | Discharge status: Home / Self Care | Payer: Medicaid Other | Attending: Emergency Medicine | Admitting: Emergency Medicine

## 2013-10-14 DIAGNOSIS — K089 Disorder of teeth and supporting structures, unspecified: Secondary | ICD-10-CM

## 2013-10-14 DIAGNOSIS — Z87448 Personal history of other diseases of urinary system: Secondary | ICD-10-CM | POA: Insufficient documentation

## 2013-10-14 DIAGNOSIS — K029 Dental caries, unspecified: Secondary | ICD-10-CM | POA: Insufficient documentation

## 2013-10-14 DIAGNOSIS — Z91040 Latex allergy status: Secondary | ICD-10-CM | POA: Insufficient documentation

## 2013-10-14 DIAGNOSIS — K0889 Other specified disorders of teeth and supporting structures: Secondary | ICD-10-CM

## 2013-10-14 DIAGNOSIS — F17200 Nicotine dependence, unspecified, uncomplicated: Secondary | ICD-10-CM | POA: Insufficient documentation

## 2013-10-14 DIAGNOSIS — Z79899 Other long term (current) drug therapy: Secondary | ICD-10-CM | POA: Insufficient documentation

## 2013-10-14 MED ORDER — AMOXICILLIN 500 MG PO CAPS: 500.0000 mg | ORAL_CAPSULE | Freq: Three times a day (TID) | ORAL | Status: DC

## 2013-10-14 MED ORDER — TRAMADOL HCL 50 MG PO TABS: 50.0000 mg | ORAL_TABLET | Freq: Four times a day (QID) | ORAL | Status: DC | PRN

## 2013-10-14 MED ORDER — HYDROCODONE-ACETAMINOPHEN 5-325 MG PO TABS: ORAL_TABLET | ORAL | Status: DC

## 2013-10-14 NOTE — ED Notes (Signed)
Pt c/o toothache was seen at Urgent  Care earlier today, st's Tramadol not helping

## 2013-10-14 NOTE — Discharge Instructions (Signed)
Thank you for coming in today. Take 2 aleve twice daily for pain.  USe tramadol for severe pain.  Follow up with a dentist ASAP.  Take amoxicillin.   Dental Pain A tooth ache may be caused by cavities (tooth decay). Cavities expose the nerve of the tooth to air and hot or cold temperatures. It may come from an infection or abscess (also called a boil or furuncle) around your tooth. It is also often caused by dental caries (tooth decay). This causes the pain you are having. DIAGNOSIS  Your caregiver can diagnose this problem by exam. TREATMENT   If caused by an infection, it may be treated with medications which kill germs (antibiotics) and pain medications as prescribed by your caregiver. Take medications as directed.  Only take over-the-counter or prescription medicines for pain, discomfort, or fever as directed by your caregiver.  Whether the tooth ache today is caused by infection or dental disease, you should see your dentist as soon as possible for further care. SEEK MEDICAL CARE IF: The exam and treatment you received today has been provided on an emergency basis only. This is not a substitute for complete medical or dental care. If your problem worsens or new problems (symptoms) appear, and you are unable to meet with your dentist, call or return to this location. SEEK IMMEDIATE MEDICAL CARE IF:   You have a fever.  You develop redness and swelling of your face, jaw, or neck.  You are unable to open your mouth.  You have severe pain uncontrolled by pain medicine. MAKE SURE YOU:   Understand these instructions.  Will watch your condition.  Will get help right away if you are not doing well or get worse. Document Released: 08/16/2005 Document Revised: 11/08/2011 Document Reviewed: 04/03/2008 [facility] Patient Information 2014 [place], [place].

## 2013-10-14 NOTE — ED Notes (Signed)
PA at bedside performing dental block

## 2013-10-14 NOTE — ED Provider Notes (Signed)
CSN: [id number]     Arrival date & time 10/14/13  2248 History  This chart was scribed for non-physician practitioner Rhea Bleacher, PA working with Layla Maw Ward, DO by Elveria Rising, ED Scribe. This patient was seen in room TR07C/TR07C and the patient's care was started at 11:32 PM.   Chief Complaint  Patient presents with  . Dental Pain      Patient is a 38 y.o. male presenting with tooth pain. The history is provided by the patient. No language interpreter was used.  Dental Pain Location:  Lower Lower teeth location:  18/LL 2nd molar Associated symptoms: no facial swelling, no fever, no headaches and no neck pain    HPI Comments: Steven Montoya is a 38 y.o. male who presents to the Emergency Department complaining of lower left dental pain, onset last night. Patient was seen at Urgent Care earlier today for his dental pain where he was prescribed Amoxicillin and Tramadol. Patient reports after taking his medication tonight he has been unable to sleep due to his severe pain. Patient has attempted to treat pain with Orajel and Aleve and has experienced no relief.   Past Medical History  Diagnosis Date  . Chronic urethral stricture   . Urgency of urination   . Frequency of urination   . Urinary straining   . Nocturia   . Lower abdominal pain   . NWGNFAOZ(308.6)    Past Surgical History  Procedure Laterality Date  . Cysto/ retrograde pyelogram/ urethral dilation  11-05-2010  . Colonoscopy N/A 08/17/2013    Procedure: COLONOSCOPY;  Surgeon: Romie Levee, MD;  Location: [facility];  Service: Endoscopy;  Laterality: N/A;   Family History  Problem Relation Age of Onset  . Heart disease Father    History  Substance Use Topics  . Smoking status: Current Every Day Smoker -- 0.50 packs/day for 4 years    Types: Cigarettes  . Smokeless tobacco: Current User    Types: Chew     Comment: not chewed tobacco in 3 months  . Alcohol Use: No     Comment: QUIT 2013    Review of  Systems  Constitutional: Negative for fever.  HENT: Positive for dental problem. Negative for ear pain, facial swelling, sore throat and trouble swallowing.   Respiratory: Negative for shortness of breath and stridor.   Musculoskeletal: Negative for neck pain.  Skin: Negative for color change.  Neurological: Negative for headaches.    Allergies  Latex and Adhesive  Home Medications   Current Outpatient Rx  Name  Route  Sig  Dispense  Refill  . amoxicillin (AMOXIL) 500 MG capsule   Oral   Take 1 capsule (500 mg total) by mouth 3 (three) times daily.   30 capsule   0   . hydrocortisone (ANUSOL-HC) 2.5 % rectal cream   Rectal   Place 1 application rectally 2 (two) times daily.   30 g   0   . lip balm (CARMEX) ointment   Topical   Apply 1 application topically as needed for lip care or irritation.          . naproxen sodium (ALEVE) 220 MG tablet   Oral   Take 220 mg by mouth every 6 (six) hours as needed (pain).         . pantoprazole (PROTONIX) 20 MG tablet   Oral   Take 1 tablet (20 mg total) by mouth daily.   30 tablet   0   . traMADol (ULTRAM)  50 MG tablet   Oral   Take 1 tablet (50 mg total) by mouth every 6 (six) hours as needed.   15 tablet   0   . Witch Hazel LIQD   Apply externally   Apply 1 application topically daily. For hemmorhoids          BP 116/70  Pulse 74  Temp(Src) 97.3 F (36.3 C) (Oral)  Resp 16  Wt 132 lb 5 oz (60.017 kg)  SpO2 99% Physical Exam  Nursing note and vitals reviewed. Constitutional: He is oriented to person, place, and time. He appears well-developed and well-nourished. No distress.  HENT:  Head: Normocephalic and atraumatic.  Right Ear: Tympanic membrane, external ear and ear canal normal.  Left Ear: Tympanic membrane, external ear and ear canal normal.  Nose: Nose normal.  Mouth/Throat: Uvula is midline, oropharynx is clear and moist and mucous membranes are normal. No trismus in the jaw. Abnormal dentition.  Dental caries present. No dental abscesses or uvula swelling. No tonsillar abscesses.  Multiple dental caries. Large cary on left second molar where patient is complaining of pain.   Eyes: EOM are normal. Pupils are equal, round, and reactive to light.  Neck: Normal range of motion. Neck supple. No tracheal deviation present.  No neck swelling or Lugwig's angina  Cardiovascular: Normal rate.   Pulmonary/Chest: Effort normal. No respiratory distress.  Musculoskeletal: Normal range of motion.  Neurological: He is alert and oriented to person, place, and time.  Skin: Skin is warm and dry.  Psychiatric: He has a normal mood and affect. His behavior is normal.    ED Course  Procedures (including critical care time) DIAGNOSTIC STUDIES: Oxygen Saturation is 99% on room air, normal by my interpretation.    COORDINATION OF CARE: 11:32 PM- Pt advised of plan for treatment and pt agrees.  Labs Review Labs Reviewed - No data to display Imaging Review No results found.  EKG Interpretation   None      Vital signs reviewed and are as follows: Filed Vitals:   10/14/13 2254  BP: 116/70  Pulse: 74  Temp: 97.3 F (36.3 C)  Resp: 16     Dental block was performed. 1.355mL of 2% lidocaine with epi was combined with 1.298mL 0.5% bupivacaine with epi and an alveolar block was performed. Injections made at base of tooth as well as the tooth immediately posterior and anterior to tooth. Some anesthesia was obtained -- patient notes dull aching pain. Minimal bleeding after injections. Patient tolerated procedure well with no immediate complications.   Wife states she will flush tramadol. States this should not be combined with Vicodin.   Patient counseled on use of narcotic pain medications. Counseled not to combine these medications with others containing tylenol. Urged not to drink alcohol, drive, or perform any other activities that requires focus while taking these medications. The patient  verbalizes understanding and agrees with the plan.  MDM   Final diagnoses:  Pain, dental   Patient with toothache.  No gross abscess.  Exam unconcerning for Ludwig's angina or other deep tissue infection in neck.  Will treat with pain medicine.  Abx prescribed earlier by urgent care. Urged patient to follow-up with dentist.    I personally performed the services described in this documentation, which was scribed in my presence. The recorded information has been reviewed and is accurate.    Renne CriglerJoshua Kaysin Brock, PA-C 10/15/13 [phone]

## 2013-10-14 NOTE — ED Provider Notes (Signed)
Steven Montoya is a 38 y.o. male who presents to Urgent Care today for upper left ankle pain. This is been present for several months off and on worse recently. Pain became dramatically worse over the past several days. He has tried Aleve which have not helped. Additionally using Orajel which have not helped. He denies any fevers chills nausea vomiting or diarrhea   Past Medical History  Diagnosis Date  . Chronic urethral stricture   . Urgency of urination   . Frequency of urination   . Urinary straining   . Nocturia   . Lower abdominal pain   . ZOXWRUEA(540.9Headache(784.0)    History  Substance Use Topics  . Smoking status: Current Every Day Smoker -- 0.50 packs/day for 4 years    Types: Cigarettes  . Smokeless tobacco: Current User    Types: Chew     Comment: not chewed tobacco in 3 months  . Alcohol Use: No     Comment: QUIT 2013   ROS as above Medications: No current facility-administered medications for this encounter.   Current Outpatient Prescriptions  Medication Sig Dispense Refill  . amoxicillin (AMOXIL) 500 MG capsule Take 1 capsule (500 mg total) by mouth 3 (three) times daily.  30 capsule  0  . hydrocortisone (ANUSOL-HC) 2.5 % rectal cream Place 1 application rectally 2 (two) times daily.  30 g  0  . ibuprofen (ADVIL,MOTRIN) 200 MG tablet Take 400 mg by mouth every 6 (six) hours as needed for mild pain or moderate pain.       Marland Kitchen. lidocaine (XYLOCAINE) 5 % ointment Apply 1 application topically as needed for mild pain or moderate pain.      Marland Kitchen. lip balm (CARMEX) ointment Apply 1 application topically as needed for lip care or irritation.       . pantoprazole (PROTONIX) 20 MG tablet Take 1 tablet (20 mg total) by mouth daily.  30 tablet  0  . traMADol (ULTRAM) 50 MG tablet Take 1 tablet (50 mg total) by mouth every 6 (six) hours as needed.  15 tablet  0  . Witch Hazel LIQD Apply 1 application topically 2 (two) times daily as needed (pain).         Exam:  BP 120/85  Pulse 68   Temp(Src) 98.4 F (36.9 C) (Oral)  Resp 18  SpO2 98% Gen: Well NAD HEENT: EOMI,  MMM upper rear most molar with large dental caries with gum line erythema    Assessment and Plan: 38 y.o. male with dental pain. Plan to treat with amoxicillin and tramadol. Followup with dentist as soon as possible  Discussed warning signs or symptoms. Please see discharge instructions. Patient expresses understanding.    Rodolph BongEvan S Averleigh Savary, MD 10/14/13 [phone]

## 2013-10-14 NOTE — ED Notes (Signed)
C/o left upper tooth pain states there is a hole in his tooth Tooth is sensitive to coldness, hot temps, and the wind blowing oragel and aleve was taking for pain relief.

## 2013-10-14 NOTE — Discharge Instructions (Signed)
Please read and follow all provided instructions.  Your diagnoses today include:  1. Pain, dental    The exam and treatment you received today has been provided on an emergency basis only. This is not a substitute for complete medical or dental care.  Tests performed today include:  Vital signs. See below for your results today.   Medications prescribed:   Vicodin (hydrocodone/acetaminophen) - narcotic pain medication, DO NOT TAKE WITH TRAMADOL  DO NOT drive or perform any activities that require you to be awake and alert because this medicine can make you drowsy. BE VERY CAREFUL not to take multiple medicines containing Tylenol (also called acetaminophen). Doing so can lead to an overdose which can damage your liver and cause liver failure and possibly death.  Take any prescribed medications only as directed.  Home care instructions:  Follow any educational materials contained in this packet.  Follow-up instructions: Please follow-up with your dentist for further evaluation of your symptoms. If you do not have a dentist or primary care doctor -- see below for referral information.   Dental Assistance: See below for dental referrals  Return instructions:   Please return to the Emergency Department if you experience worsening symptoms.  Please return if you develop a fever, you develop more swelling in your face or neck, you have trouble breathing or swallowing food.  Please return if you have any other emergent concerns.  Additional Information:  Your vital signs today were: BP 116/70   Pulse 74   Temp(Src) 97.3 F (36.3 C) (Oral)   Resp 16   Wt 132 lb 5 oz (60.017 kg)   SpO2 99% If your blood pressure (BP) was elevated above 135/85 this visit, please have this repeated by your doctor within one month. -------------- Dental Care: Organization         Address  Phone  Notes  [facility] Department of [facility] [address] [place], [place]  [phone] Accepts children up to age 47 who are enrolled in [place] or [place] Health Choice; pregnant women with a Medicaid card; and children who have applied for Medicaid or [place] Health Choice, but were declined, whose parents can pay a reduced fee at time of service.  [facility] Department of [facility]  [address], [place] [phone] Accepts children up to age 16 who are enrolled in [place] or [place] Health Choice; pregnant women with a Medicaid card; and children who have applied for Medicaid or  Health Choice, but were declined, whose parents can pay a reduced fee at time of service.  [facility]  [address] [place], [place] [phone] Patients are seen by appointment only. Walk-ins are not accepted. [facility] will see patients 19 years of age and older. Monday - Tuesday (8am-5pm) Most Wednesdays (8:30-5pm) $30 per visit, cash only  [facility]  [address], [facility] [phone] Patients are seen by appointment only. Walk-ins are not accepted. [facility] will see patients 54 years of age and older. One Wednesday Evening (Monthly: Volunteer Based).  $30 per visit, cash only  [employer]  [phone] for adults; Children under age 30, call Graduate Pediatric Dentistry at [phone]. Children aged 52-14, please call [phone] to request a pediatric application.  Dental services are provided in all areas of dental care including fillings, crowns and bridges, complete and partial dentures, implants, gum treatment,  root canals, and extractions. Preventive care is also provided. Treatment is provided to both adults and children. Patients are selected via a lottery and there is often a waiting list.   [facility] [address], [place]  [phone] [email or web address]   [facility] [address], [place], [place] [phone],  Ext. 123 Second and Fourth Thursday of each month, opens at 6:30 AM; Clinic ends at 9 AM.  Patients are seen on a first-come first-served basis, and a limited number are seen during each clinic.   [facility]  [address], [place], [place] [phone]   Eligibility Requirements You must have lived in [place], [place], or [place] counties for at least the last three months.   You cannot be eligible for state or federal sponsored [place] insurance, including [employer]eterans [employer], [place], or [employer]icare.   You generally cannot be eligible for healthcare insurance through your employer.    How to apply: Eligibility screenings are held every Tuesday and Wednesday afternoon from 1:00 pm until 4:00 pm. You do not need an appointment for the interview!  [facility] [address], [place], [place] [phone]   [facility]  [phone]   [facility]  [phone]   [facility]  [phone]

## 2013-10-15 NOTE — ED Provider Notes (Signed)
Medical screening examination/treatment/procedure(s) were performed by non-physician practitioner and as supervising physician I was immediately available for consultation/collaboration.  EKG Interpretation   None         Layla MawKristen N Ward, DO 10/15/13 [id number]

## 2013-11-04 ENCOUNTER — Emergency Department (HOSPITAL_COMMUNITY)
Admission: EM | Admit: 2013-11-04 | Discharge: 2013-11-04 | Disposition: A | Discharge status: Home / Self Care | Payer: Self-pay | Source: Home / Self Care | Attending: Emergency Medicine | Admitting: Emergency Medicine

## 2013-11-04 ENCOUNTER — Encounter (HOSPITAL_COMMUNITY): Payer: Self-pay | Admitting: Emergency Medicine

## 2013-11-04 DIAGNOSIS — H60399 Other infective otitis externa, unspecified ear: Secondary | ICD-10-CM

## 2013-11-04 DIAGNOSIS — H6092 Unspecified otitis externa, left ear: Secondary | ICD-10-CM

## 2013-11-04 MED ORDER — ANTIPYRINE-BENZOCAINE 5.4-1.4 % OT SOLN: 3.0000 [drp] | Freq: Once | OTIC | Status: DC

## 2013-11-04 MED ORDER — HYDROCODONE-ACETAMINOPHEN 5-325 MG PO TABS
ORAL_TABLET | ORAL | Status: DC
Start: 2013-11-04 — End: 2014-04-25

## 2013-11-04 MED ORDER — NEOMYCIN-POLYMYXIN-HC 3.5-10000-1 OT SUSP: 4.0000 [drp] | Freq: Three times a day (TID) | OTIC | Status: DC

## 2013-11-04 MED ORDER — ANTIPYRINE-BENZOCAINE 5.4-1.4 % OT SOLN
3.0000 [drp] | Freq: Once | OTIC | Status: AC
  Administered 2013-11-04: 4 [drp] via OTIC

## 2013-11-04 NOTE — Discharge Instructions (Signed)
Otitis Externa Otitis externa is a bacterial or fungal infection of the outer ear canal. This is the area from the eardrum to the outside of the ear. Otitis externa is sometimes called "swimmer's ear." CAUSES  Possible causes of infection include:  Swimming in dirty water.  Moisture remaining in the ear after swimming or bathing.  Mild injury (trauma) to the ear.  Objects stuck in the ear (foreign body).  Cuts or scrapes (abrasions) on the outside of the ear. SYMPTOMS  The first symptom of infection is often itching in the ear canal. Later signs and symptoms may include swelling and redness of the ear canal, ear pain, and yellowish-white fluid (pus) coming from the ear. The ear pain may be worse when pulling on the earlobe. DIAGNOSIS  Your caregiver will perform a physical exam. A sample of fluid may be taken from the ear and examined for bacteria or fungi. TREATMENT  Antibiotic ear drops are often given for 10 to 14 days. Treatment may also include pain medicine or corticosteroids to reduce itching and swelling. PREVENTION   Keep your ear dry. Use the corner of a towel to absorb water out of the ear canal after swimming or bathing.  Avoid scratching or putting objects inside your ear. This can damage the ear canal or remove the protective wax that lines the canal. This makes it easier for bacteria and fungi to grow.  Avoid swimming in lakes, polluted water, or poorly chlorinated pools.  You may use ear drops made of rubbing alcohol and vinegar after swimming. Combine equal parts of white vinegar and alcohol in a bottle. Put 3 or 4 drops into each ear after swimming. HOME CARE INSTRUCTIONS   Apply antibiotic ear drops to the ear canal as prescribed by your caregiver.  Only take over-the-counter or prescription medicines for pain, discomfort, or fever as directed by your caregiver.  If you have diabetes, follow any additional treatment instructions from your caregiver.  Keep all  follow-up appointments as directed by your caregiver. SEEK MEDICAL CARE IF:   You have a fever.  Your ear is still red, swollen, painful, or draining pus after 3 days.  Your redness, swelling, or pain gets worse.  You have a severe headache.  You have redness, swelling, pain, or tenderness in the area behind your ear. MAKE SURE YOU:   Understand these instructions.  Will watch your condition.  Will get help right away if you are not doing well or get worse. Document Released: 08/16/2005 Document Revised: 11/08/2011 Document Reviewed: 09/02/2011 ExitCare Patient Information 2014 [employer], [employer].  

## 2013-11-04 NOTE — ED Provider Notes (Signed)
Medical screening examination/treatment/procedure(s) were performed by non-physician practitioner and as supervising physician I was immediately available for consultation/collaboration.  Onyx Edgley, M.D.  Milbern Doescher C Elise Gladden, MD 11/04/13 2116 

## 2013-11-04 NOTE — ED Provider Notes (Signed)
CSN: [id number]     Arrival date & time 11/04/13  1051 History   First MD Initiated Contact with Patient 11/04/13 1232     Chief Complaint  Patient presents with  . Otalgia   (Consider location/radiation/quality/duration/timing/severity/associated sxs/prior Treatment) Patient is a 38 y.o. male presenting with ear pain. The history is provided by the patient. No language interpreter was used.  Otalgia Location:  Left Behind ear:  No abnormality Quality:  Aching Severity:  Moderate Onset quality:  Sudden Duration:  2 days Timing:  Constant Progression:  Worsening Chronicity:  New Context: not direct blow   Relieved by:  Nothing Ineffective treatments:  None tried Associated symptoms: no fever     Past Medical History  Diagnosis Date  . Chronic urethral stricture   . Urgency of urination   . Frequency of urination   . Urinary straining   . Nocturia   . Lower abdominal pain   . NWGNFAOZ(308.6Headache(784.0)    Past Surgical History  Procedure Laterality Date  . Cysto/ retrograde pyelogram/ urethral dilation  11-05-2010  . Colonoscopy N/A 08/17/2013    Procedure: COLONOSCOPY;  Surgeon: Romie LeveeAlicia Thomas, MD;  Location: [facility];  Service: Endoscopy;  Laterality: N/A;   Family History  Problem Relation Age of Onset  . Heart disease Father    History  Substance Use Topics  . Smoking status: Current Every Day Smoker -- 0.50 packs/day for 4 years    Types: Cigarettes  . Smokeless tobacco: Current User    Types: Chew     Comment: not chewed tobacco in 3 months  . Alcohol Use: No     Comment: QUIT 2013    Review of Systems  Constitutional: Negative for fever.  HENT: Positive for ear pain.   All other systems reviewed and are negative.    Allergies  Latex and Adhesive  Home Medications   Current Outpatient Rx  Name  Route  Sig  Dispense  Refill  . amoxicillin (AMOXIL) 500 MG capsule   Oral   Take 1 capsule (500 mg total) by mouth 3 (three) times daily.   30 capsule    0   . HYDROcodone-acetaminophen (NORCO/VICODIN) 5-325 MG per tablet      Take 1-2 tablets every 6 hours as needed for severe pain   8 tablet   0   . hydrocortisone (ANUSOL-HC) 2.5 % rectal cream   Rectal   Place 1 application rectally 2 (two) times daily.   30 g   0   . lip balm (CARMEX) ointment   Topical   Apply 1 application topically as needed for lip care or irritation.          . naproxen sodium (ALEVE) 220 MG tablet   Oral   Take 220 mg by mouth every 6 (six) hours as needed (pain).         . pantoprazole (PROTONIX) 20 MG tablet   Oral   Take 1 tablet (20 mg total) by mouth daily.   30 tablet   0   . traMADol (ULTRAM) 50 MG tablet   Oral   Take 1 tablet (50 mg total) by mouth every 6 (six) hours as needed.   15 tablet   0   . Witch Hazel LIQD   Apply externally   Apply 1 application topically daily. For hemmorhoids          BP 117/88  Pulse 82  Temp(Src) 98 F (36.7 C) (Oral)  Resp 18  SpO2 100% Physical  Exam  Nursing note and vitals reviewed. Constitutional: He is oriented to person, place, and time. He appears well-developed and well-nourished.  HENT:  Head: Normocephalic and atraumatic.  Canal tender, swollen  Eyes: EOM are normal. Pupils are equal, round, and reactive to light.  Neck: Normal range of motion.  Cardiovascular: Normal rate.   Pulmonary/Chest: Effort normal.  Abdominal: He exhibits no distension.  Musculoskeletal: Normal range of motion.  Neurological: He is alert and oriented to person, place, and time.  Skin: Skin is warm.  Psychiatric: He has a normal mood and affect.    ED Course  Procedures (including critical care time) Labs Review Labs Reviewed - No data to display Imaging Review No results found.   MDM   1. External otitis of left ear     auralgan  Cortisporin otic and hydrocodone   Elson Areas, PA-C 11/04/13 1308  Lonia Skinner [place], [place] 11/04/13 1312

## 2013-11-04 NOTE — ED Notes (Signed)
Left ear pain, onset yesterday. Last week had a cold: runny nose, cough.  Yesterday left ear started hurting, hearing is muffled "like in box" patient has put sweet oil in ear

## 2013-12-15 ENCOUNTER — Encounter (HOSPITAL_COMMUNITY): Payer: Self-pay | Admitting: Emergency Medicine

## 2013-12-15 ENCOUNTER — Emergency Department (HOSPITAL_COMMUNITY)
Admission: EM | Admit: 2013-12-15 | Discharge: 2013-12-15 | Disposition: A | Discharge status: Home / Self Care | Payer: Medicaid Other | Attending: Emergency Medicine | Admitting: Emergency Medicine

## 2013-12-15 DIAGNOSIS — T24209A Burn of second degree of unspecified site of unspecified lower limb, except ankle and foot, initial encounter: Secondary | ICD-10-CM | POA: Insufficient documentation

## 2013-12-15 DIAGNOSIS — K089 Disorder of teeth and supporting structures, unspecified: Secondary | ICD-10-CM | POA: Insufficient documentation

## 2013-12-15 DIAGNOSIS — T300 Burn of unspecified body region, unspecified degree: Secondary | ICD-10-CM

## 2013-12-15 DIAGNOSIS — Y92410 Unspecified street and highway as the place of occurrence of the external cause: Secondary | ICD-10-CM | POA: Insufficient documentation

## 2013-12-15 DIAGNOSIS — K0889 Other specified disorders of teeth and supporting structures: Secondary | ICD-10-CM

## 2013-12-15 DIAGNOSIS — Z792 Long term (current) use of antibiotics: Secondary | ICD-10-CM | POA: Insufficient documentation

## 2013-12-15 DIAGNOSIS — Z91040 Latex allergy status: Secondary | ICD-10-CM | POA: Insufficient documentation

## 2013-12-15 DIAGNOSIS — F17200 Nicotine dependence, unspecified, uncomplicated: Secondary | ICD-10-CM | POA: Insufficient documentation

## 2013-12-15 DIAGNOSIS — Z87448 Personal history of other diseases of urinary system: Secondary | ICD-10-CM | POA: Insufficient documentation

## 2013-12-15 DIAGNOSIS — X19XXXA Contact with other heat and hot substances, initial encounter: Secondary | ICD-10-CM | POA: Insufficient documentation

## 2013-12-15 DIAGNOSIS — Y9355 Activity, bike riding: Secondary | ICD-10-CM | POA: Insufficient documentation

## 2013-12-15 DIAGNOSIS — Z79899 Other long term (current) drug therapy: Secondary | ICD-10-CM | POA: Insufficient documentation

## 2013-12-15 DIAGNOSIS — IMO0002 Reserved for concepts with insufficient information to code with codable children: Secondary | ICD-10-CM | POA: Insufficient documentation

## 2013-12-15 MED ORDER — SILVER SULFADIAZINE 1 % EX CREA
TOPICAL_CREAM | Freq: Once | CUTANEOUS | Status: AC
  Administered 2013-12-15: 1 via TOPICAL
  Filled 2013-12-15: qty 85

## 2013-12-15 MED ORDER — PENICILLIN V POTASSIUM 500 MG PO TABS: 500.0000 mg | ORAL_TABLET | Freq: Four times a day (QID) | ORAL | Status: DC

## 2013-12-15 MED ORDER — TRAMADOL HCL 50 MG PO TABS
50.0000 mg | ORAL_TABLET | Freq: Four times a day (QID) | ORAL | Status: DC | PRN
Start: 2013-12-15 — End: 2014-04-25

## 2013-12-15 NOTE — Discharge Instructions (Signed)
Take Veetid as directed until gone. Take Tramadol as needed for pain. Refer to attached documents for more information. Follow up with your doctor.
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
## 2013-12-16 NOTE — ED Provider Notes (Signed)
Medical screening examination/treatment/procedure(s) were performed by non-physician practitioner and as supervising physician I was immediately available for consultation/collaboration.   EKG Interpretation None       Lekeith Wulf, MD 12/16/13 1559 

## 2014-04-25 ENCOUNTER — Emergency Department (HOSPITAL_COMMUNITY)
Admission: EM | Admit: 2014-04-25 | Discharge: 2014-04-25 | Disposition: A | Discharge status: Home / Self Care | Payer: Medicaid Other | Attending: Emergency Medicine | Admitting: Emergency Medicine

## 2014-04-25 ENCOUNTER — Emergency Department (HOSPITAL_COMMUNITY): Payer: Medicaid Other

## 2014-04-25 ENCOUNTER — Encounter (HOSPITAL_COMMUNITY): Payer: Self-pay | Admitting: Emergency Medicine

## 2014-04-25 DIAGNOSIS — Z7982 Long term (current) use of aspirin: Secondary | ICD-10-CM | POA: Insufficient documentation

## 2014-04-25 DIAGNOSIS — Z91040 Latex allergy status: Secondary | ICD-10-CM | POA: Insufficient documentation

## 2014-04-25 DIAGNOSIS — F17200 Nicotine dependence, unspecified, uncomplicated: Secondary | ICD-10-CM | POA: Diagnosis not present

## 2014-04-25 DIAGNOSIS — Y9289 Other specified places as the place of occurrence of the external cause: Secondary | ICD-10-CM | POA: Insufficient documentation

## 2014-04-25 DIAGNOSIS — S61431A Puncture wound without foreign body of right hand, initial encounter: Secondary | ICD-10-CM

## 2014-04-25 DIAGNOSIS — Y9389 Activity, other specified: Secondary | ICD-10-CM | POA: Insufficient documentation

## 2014-04-25 DIAGNOSIS — W268XXA Contact with other sharp object(s), not elsewhere classified, initial encounter: Secondary | ICD-10-CM | POA: Diagnosis not present

## 2014-04-25 DIAGNOSIS — R51 Headache: Secondary | ICD-10-CM | POA: Insufficient documentation

## 2014-04-25 DIAGNOSIS — S61409A Unspecified open wound of unspecified hand, initial encounter: Secondary | ICD-10-CM | POA: Diagnosis present

## 2014-04-25 DIAGNOSIS — Z87898 Personal history of other specified conditions: Secondary | ICD-10-CM | POA: Diagnosis not present

## 2014-04-25 MED ORDER — SULFAMETHOXAZOLE-TRIMETHOPRIM 800-160 MG PO TABS: 1.0000 | ORAL_TABLET | Freq: Two times a day (BID) | ORAL | Status: DC

## 2014-04-25 MED ORDER — HYDROCODONE-ACETAMINOPHEN 5-325 MG PO TABS: 1.0000 | ORAL_TABLET | ORAL | Status: DC | PRN

## 2014-04-25 MED ORDER — LORAZEPAM 1 MG PO TABS
1.0000 mg | ORAL_TABLET | Freq: Once | ORAL | Status: AC
  Administered 2014-04-25: 1 mg via ORAL
  Filled 2014-04-25: qty 2

## 2014-04-25 MED ORDER — CEPHALEXIN 500 MG PO CAPS: 500.0000 mg | ORAL_CAPSULE | Freq: Four times a day (QID) | ORAL | Status: DC

## 2014-04-25 MED ORDER — OXYCODONE-ACETAMINOPHEN 5-325 MG PO TABS
1.0000 | ORAL_TABLET | Freq: Once | ORAL | Status: AC
  Administered 2014-04-25: 1 via ORAL
  Filled 2014-04-25: qty 1
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
## 2014-04-25 NOTE — ED Notes (Signed)
Pt transported to Xray. 
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
## 2014-04-25 NOTE — Discharge Instructions (Signed)
Read the information below.  Use the prescribed medication as directed.  Please discuss all new medications with your pharmacist.  Do not take additional tylenol while taking the prescribed pain medication to avoid overdose.  You may return to the Emergency Department at any time for worsening condition or any new symptoms that concern you.  If you develop redness, swelling, pus draining from the wound, or fevers greater than 100.4, return to the ER immediately for a recheck.     Puncture Wound A puncture wound is an injury that extends through all layers of the skin and into the tissue beneath the skin (subcutaneous tissue). Puncture wounds become infected easily because germs often enter the body and go beneath the skin during the injury. Having a deep wound with a small entrance point makes it difficult for your caregiver to adequately clean the wound. This is especially true if you have stepped on a nail and it has passed through a dirty shoe or other situations where the wound is obviously contaminated. CAUSES  Many puncture wounds involve glass, nails, splinters, fish hooks, or other objects that enter the skin (foreign bodies). A puncture wound may also be caused by a human bite or animal bite. DIAGNOSIS  A puncture wound is usually diagnosed by your history and a physical exam. You may need to have an X-ray or an ultrasound to check for any foreign bodies still in the wound. TREATMENT   Your caregiver will clean the wound as thoroughly as possible. Depending on the location of the wound, a bandage (dressing) may be applied.  Your caregiver might prescribe antibiotic medicines.  You may need a follow-up visit to check on your wound. Follow all instructions as directed by your caregiver. HOME CARE INSTRUCTIONS   Change your dressing once per day, or as directed by your caregiver. If the dressing sticks, it may be removed by soaking the area in water.  If your caregiver has given you  follow-up instructions, it is very important that you return for a follow-up appointment. Not following up as directed could result in a chronic or permanent injury, pain, and disability.  Only take over-the-counter or prescription medicines for pain, discomfort, or fever as directed by your caregiver.  If you are given antibiotics, take them as directed. Finish them even if you start to feel better. You may need a tetanus shot if:  You cannot remember when you had your last tetanus shot.  You have never had a tetanus shot. If you got a tetanus shot, your arm may swell, get red, and feel warm to the touch. This is common and not a problem. If you need a tetanus shot and you choose not to have one, there is a rare chance of getting tetanus. Sickness from tetanus can be serious. You may need a rabies shot if an animal bite caused your puncture wound. SEEK MEDICAL CARE IF:   You have redness, swelling, or increasing pain in the wound.  You have red streaks going away from the wound.  You notice a bad smell coming from the wound or dressing.  You have yellowish-white fluid (pus) coming from the wound.  You are treated with an antibiotic for infection, but the infection is not getting better.  You notice something in the wound, such as rubber from your shoe, cloth, or another object.  You have a fever.  You have severe pain.  You have difficulty breathing.  You feel dizzy or faint.  You cannot stop  vomiting.  You lose feeling, develop numbness, or cannot move a limb below the wound.  Your symptoms worsen. MAKE SURE YOU:  Understand these instructions.  Will watch your condition.  Will get help right away if you are not doing well or get worse. Document Released: 05/26/2005 Document Revised: 11/08/2011 Document Reviewed: 02/02/2011 [facility] Patient Information 2015 [place], [place]. This information is not intended to replace advice given to you by your health care provider.  Make sure you discuss any questions you have with your health care provider.

## 2014-04-26 NOTE — ED Provider Notes (Signed)
Medical screening examination/treatment/procedure(s) were performed by non-physician practitioner and as supervising physician I was immediately available for consultation/collaboration.   EKG Interpretation None        Christopher J. Pollina, MD 04/26/14 0029 

## 2014-04-26 NOTE — ED Provider Notes (Signed)
Medical screening examination/treatment/procedure(s) were performed by non-physician practitioner and as supervising physician I was immediately available for consultation/collaboration.   EKG Interpretation None        Richardean Canal, MD 04/26/14 1150

## 2014-04-29 ENCOUNTER — Encounter (HOSPITAL_COMMUNITY): Payer: Self-pay | Admitting: Emergency Medicine

## 2014-04-29 ENCOUNTER — Emergency Department (HOSPITAL_COMMUNITY)
Admission: EM | Admit: 2014-04-29 | Discharge: 2014-04-29 | Disposition: A | Discharge status: Home / Self Care | Payer: Medicaid Other | Attending: Emergency Medicine | Admitting: Emergency Medicine

## 2014-04-29 DIAGNOSIS — K0889 Other specified disorders of teeth and supporting structures: Secondary | ICD-10-CM

## 2014-04-29 DIAGNOSIS — Z91040 Latex allergy status: Secondary | ICD-10-CM | POA: Insufficient documentation

## 2014-04-29 DIAGNOSIS — K089 Disorder of teeth and supporting structures, unspecified: Secondary | ICD-10-CM | POA: Diagnosis not present

## 2014-04-29 DIAGNOSIS — K1370 Unspecified lesions of oral mucosa: Secondary | ICD-10-CM | POA: Diagnosis not present

## 2014-04-29 DIAGNOSIS — K1379 Other lesions of oral mucosa: Secondary | ICD-10-CM

## 2014-04-29 DIAGNOSIS — F17200 Nicotine dependence, unspecified, uncomplicated: Secondary | ICD-10-CM | POA: Diagnosis not present

## 2014-04-29 DIAGNOSIS — Z7982 Long term (current) use of aspirin: Secondary | ICD-10-CM | POA: Insufficient documentation

## 2014-04-29 DIAGNOSIS — Z87448 Personal history of other diseases of urinary system: Secondary | ICD-10-CM | POA: Diagnosis not present

## 2014-04-29 DIAGNOSIS — Z79899 Other long term (current) drug therapy: Secondary | ICD-10-CM | POA: Diagnosis not present

## 2014-04-29 MED ORDER — HYDROCODONE-ACETAMINOPHEN 5-325 MG PO TABS: 1.0000 | ORAL_TABLET | Freq: Four times a day (QID) | ORAL | Status: DC | PRN

## 2014-04-29 MED ORDER — OXYCODONE-ACETAMINOPHEN 5-325 MG PO TABS
2.0000 | ORAL_TABLET | Freq: Once | ORAL | Status: AC
  Administered 2014-04-29: 2 via ORAL
  Filled 2014-04-29: qty 2

## 2014-04-29 NOTE — Discharge Instructions (Signed)
Take pain medication as prescribed.  Do not drive or operate heavy machinery for 4-6 hours after taking medication. °

## 2014-04-29 NOTE — ED Provider Notes (Signed)
CSN: [id number]     Arrival date & time 04/29/14  1102 History   First MD Initiated Contact with Patient 04/29/14 1117     Chief Complaint  Patient presents with  . Dental Pain    gums bleeding from canine teeth extraction     (Consider location/radiation/quality/duration/timing/severity/associated sxs/prior Treatment) HPI Comments: Patient presents today with a chief complaint of oral bleeding.  He reports that earlier today he was seen by an Transport planner and had a procedure performed.  He states that the oral surgeon made an incision into his upper and lower gingiva and shaved down the bone to help his dentures to fit.  Sutures were then placed.  He states that the lower gingiva has been bleeding since the procedure was done.  He has been holding a rag up to his mouth, but has not been applying direct pressure to the bleeding area.  He denies any history of bleeding disorders.  He is currently not on any anticoagulants.  He denies nausea, vomiting, fever, chills, neck stiffness, or SOB.  Patient is a 38 y.o. male presenting with tooth pain. The history is provided by the patient.  Dental Pain   Past Medical History  Diagnosis Date  . Chronic urethral stricture   . Urgency of urination   . Frequency of urination   . Urinary straining   . Nocturia   . Lower abdominal pain   . [id number](478.2)    Past Surgical History  Procedure Laterality Date  . Cysto/ retrograde pyelogram/ urethral dilation  11-05-2010  . Colonoscopy N/A 08/17/2013    Procedure: COLONOSCOPY;  Surgeon: Romie Levee, MD;  Location: [facility];  Service: Endoscopy;  Laterality: N/A;  . Tooth extraction      canines   Family History  Problem Relation Age of Onset  . Heart disease Father    History  Substance Use Topics  . Smoking status: Current Every Day Smoker -- 0.50 packs/day for 4 years    Types: Cigarettes  . Smokeless tobacco: Current User    Types: Chew     Comment: not chewed tobacco in 3 months   . Alcohol Use: No     Comment: QUIT 2013    Review of Systems  All other systems reviewed and are negative.     Allergies  Latex and Adhesive  Home Medications   Prior to Admission medications   Medication Sig Start Date End Date Taking? Authorizing Provider  Aspirin-Acetaminophen (GOODYS BODY PAIN PO) Take 1 packet by mouth daily as needed (Pain).    Historical Provider, MD  cephALEXin (KEFLEX) 500 MG capsule Take 1 capsule (500 mg total) by mouth 4 (four) times daily. 04/25/14   Trixie Dredge, PA-C  HYDROcodone-acetaminophen (NORCO/VICODIN) 5-325 MG per tablet Take 1 tablet by mouth every 4 (four) hours as needed for moderate pain or severe pain. 04/25/14   Trixie Dredge, PA-C  sulfamethoxazole-trimethoprim (SEPTRA DS) 800-160 MG per tablet Take 1 tablet by mouth 2 (two) times daily. 04/25/14   Trixie Dredge, PA-C   BP 130/81  Pulse 70  Temp(Src) 98.3 F (36.8 C) (Axillary)  Resp 20  Ht  (1.803 m)  Wt 165 lb (74.844 kg)  BMI 23.02 kg/m2  SpO2 100% Physical Exam  Nursing note and vitals reviewed. Constitutional: He appears well-developed and well-nourished.  HENT:  Head: Normocephalic and atraumatic.  Edentulous mouth.  Sutures intact.  Small amount of bleeding of the mid lower gingiva.   No trismus.  No dental abscess.  Neck: Normal range of motion. Neck supple.  Cardiovascular: Normal rate, regular rhythm and normal heart sounds.   Pulmonary/Chest: Effort normal and breath sounds normal.  Musculoskeletal: Normal range of motion.  Neurological: He is alert.  Skin: Skin is warm and dry.  Psychiatric: He has a normal mood and affect.    ED Course  Procedures (including critical care time) Labs Review Labs Reviewed - No data to display  Imaging Review No results found.   EKG Interpretation None     12:14 PM Reassessed patient.  Bleeding controlled at this time after applying pressure. MDM   Final diagnoses:  None   Patient presents today with dental  bleeding after a dental procedure earlier today.  Small amount of bleeding of the lower gingiva visualized initially.  Bleeding completely resolved after applying pressure to the area.  Sutures intact.  No evidence of infection at this time.  Patient stable for discharge.  Patient instructed to follow up with Oral Surgeon.  Return precautions given.    Santiago Glad, PA-C 04/30/14 [phone]

## 2014-04-29 NOTE — ED Notes (Addendum)
Pt had all 4 canines extracted this morning at 0800. Started bleeding as soon as he walked out and hasn't stopped. Sutures still intact.

## 2014-05-02 NOTE — ED Provider Notes (Signed)
Medical screening examination/treatment/procedure(s) were performed by non-physician practitioner and as supervising physician I was immediately available for consultation/collaboration.   EKG Interpretation None       Derwood Kaplan, MD 05/02/14 [phone]

## 2014-05-13 ENCOUNTER — Other Ambulatory Visit: Payer: Self-pay | Admitting: Sports Medicine

## 2014-05-18 ENCOUNTER — Encounter (HOSPITAL_COMMUNITY): Payer: Self-pay | Admitting: Emergency Medicine

## 2014-05-18 ENCOUNTER — Emergency Department (HOSPITAL_COMMUNITY): Payer: Medicaid Other

## 2014-05-18 ENCOUNTER — Emergency Department (HOSPITAL_COMMUNITY)
Admission: EM | Admit: 2014-05-18 | Discharge: 2014-05-18 | Disposition: A | Discharge status: Home / Self Care | Payer: Medicaid Other | Source: Home / Self Care | Attending: Emergency Medicine | Admitting: Emergency Medicine

## 2014-05-18 ENCOUNTER — Emergency Department (HOSPITAL_COMMUNITY)
Admission: EM | Admit: 2014-05-18 | Discharge: 2014-05-18 | Discharge status: Against Medical Advice | Payer: Medicaid Other | Attending: Emergency Medicine | Admitting: Emergency Medicine

## 2014-05-18 DIAGNOSIS — M12869 Other specific arthropathies, not elsewhere classified, unspecified knee: Secondary | ICD-10-CM

## 2014-05-18 DIAGNOSIS — S99929A Unspecified injury of unspecified foot, initial encounter: Principal | ICD-10-CM

## 2014-05-18 DIAGNOSIS — Z91040 Latex allergy status: Secondary | ICD-10-CM

## 2014-05-18 DIAGNOSIS — F17200 Nicotine dependence, unspecified, uncomplicated: Secondary | ICD-10-CM

## 2014-05-18 DIAGNOSIS — G8918 Other acute postprocedural pain: Secondary | ICD-10-CM | POA: Insufficient documentation

## 2014-05-18 DIAGNOSIS — S99919A Unspecified injury of unspecified ankle, initial encounter: Secondary | ICD-10-CM | POA: Diagnosis present

## 2014-05-18 DIAGNOSIS — S8990XA Unspecified injury of unspecified lower leg, initial encounter: Secondary | ICD-10-CM | POA: Diagnosis not present

## 2014-05-18 DIAGNOSIS — Z87448 Personal history of other diseases of urinary system: Secondary | ICD-10-CM | POA: Insufficient documentation

## 2014-05-18 DIAGNOSIS — S8002XD Contusion of left knee, subsequent encounter: Secondary | ICD-10-CM

## 2014-05-18 DIAGNOSIS — S8992XA Unspecified injury of left lower leg, initial encounter: Secondary | ICD-10-CM

## 2014-05-18 MED ORDER — IBUPROFEN 800 MG PO TABS: 800.0000 mg | ORAL_TABLET | Freq: Three times a day (TID) | ORAL | Status: DC

## 2014-05-18 MED ORDER — IBUPROFEN 400 MG PO TABS
800.0000 mg | ORAL_TABLET | Freq: Once | ORAL | Status: AC
  Administered 2014-05-18: 800 mg via ORAL
  Filled 2014-05-18: qty 2
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
## 2014-05-18 NOTE — Discharge Instructions (Signed)
Take Ibuprofen as needed for pain.  Follow up with orthopedics and primary care physician for your knee pain.  Ice your knee, keep it elevated, and return to the ER should your symptoms return, worsen, change, or should you experience any numbness, tingling, weakness.    Knee Pain The knee is the complex joint between your thigh and your lower leg. It is made up of bones, tendons, ligaments, and cartilage. The bones that make up the knee are:  The femur in the thigh.  The tibia and fibula in the lower leg.  The patella or kneecap riding in the groove on the lower femur. CAUSES  Knee pain is a common complaint with many causes. A few of these causes are:  Injury, such as:  A ruptured ligament or tendon injury.  Torn cartilage.  Medical conditions, such as:  Gout  Arthritis  Infections  Overuse, over training, or overdoing a physical activity. Knee pain can be minor or severe. Knee pain can accompany debilitating injury. Minor knee problems often respond well to self-care measures or get well on their own. More serious injuries may need medical intervention or even surgery. SYMPTOMS The knee is complex. Symptoms of knee problems can vary widely. Some of the problems are:  Pain with movement and weight bearing.  Swelling and tenderness.  Buckling of the knee.  Inability to straighten or extend your knee.  Your knee locks and you cannot straighten it.  Warmth and redness with pain and fever.  Deformity or dislocation of the kneecap. DIAGNOSIS  Determining what is wrong may be very straight forward such as when there is an injury. It can also be challenging because of the complexity of the knee. Tests to make a diagnosis may include:  Your caregiver taking a history and doing a physical exam.  Routine X-rays can be used to rule out other problems. X-rays will not reveal a cartilage tear. Some injuries of the knee can be diagnosed by:  Arthroscopy a surgical technique by  which a small video camera is inserted through tiny incisions on the sides of the knee. This procedure is used to examine and repair internal knee joint problems. Tiny instruments can be used during arthroscopy to repair the torn knee cartilage (meniscus).  Arthrography is a radiology technique. A contrast liquid is directly injected into the knee joint. Internal structures of the knee joint then become visible on X-ray film.  An MRI scan is a non X-ray radiology procedure in which magnetic fields and a computer produce two- or three-dimensional images of the inside of the knee. Cartilage tears are often visible using an MRI scanner. MRI scans have largely replaced arthrography in diagnosing cartilage tears of the knee.  Blood work.  Examination of the fluid that helps to lubricate the knee joint (synovial fluid). This is done by taking a sample out using a needle and a syringe. TREATMENT The treatment of knee problems depends on the cause. Some of these treatments are:  Depending on the injury, proper casting, splinting, surgery, or physical therapy care will be needed.  Give yourself adequate recovery time. Do not overuse your joints. If you begin to get sore during workout routines, back off. Slow down or do fewer repetitions.  For repetitive activities such as cycling or running, maintain your strength and nutrition.  Alternate muscle groups. For example, if you are a weight lifter, work the upper body on one day and the lower body the next.  Either tight or weak muscles  do not give the proper support for your knee. Tight or weak muscles do not absorb the stress placed on the knee joint. Keep the muscles surrounding the knee strong.  Take care of mechanical problems.  If you have flat feet, orthotics or special shoes may help. See your caregiver if you need help.  Arch supports, sometimes with wedges on the inner or outer aspect of the heel, can help. These can shift pressure away from  the side of the knee most bothered by osteoarthritis.  A brace called an "unloader" brace also may be used to help ease the pressure on the most arthritic side of the knee.  If your caregiver has prescribed crutches, braces, wraps or ice, use as directed. The acronym for this is PRICE. This means protection, rest, ice, compression, and elevation.  Nonsteroidal anti-inflammatory drugs (NSAIDs), can help relieve pain. But if taken immediately after an injury, they may actually increase swelling. Take NSAIDs with food in your stomach. Stop them if you develop stomach problems. Do not take these if you have a history of ulcers, stomach pain, or bleeding from the bowel. Do not take without your caregiver's approval if you have problems with fluid retention, heart failure, or kidney problems.  For ongoing knee problems, physical therapy may be helpful.  Glucosamine and chondroitin are over-the-counter dietary supplements. Both may help relieve the pain of osteoarthritis in the knee. These medicines are different from the usual anti-inflammatory drugs. Glucosamine may decrease the rate of cartilage destruction.  Injections of a corticosteroid drug into your knee joint may help reduce the symptoms of an arthritis flare-up. They may provide pain relief that lasts a few months. You may have to wait a few months between injections. The injections do have a small increased risk of infection, water retention, and elevated blood sugar levels.  Hyaluronic acid injected into damaged joints may ease pain and provide lubrication. These injections may work by reducing inflammation. A series of shots may give relief for as [place] as 6 months.  Topical painkillers. Applying certain ointments to your skin may help relieve the pain and stiffness of osteoarthritis. Ask your pharmacist for suggestions. Many over the-counter products are approved for temporary relief of arthritis pain.  In some countries, doctors often  prescribe topical NSAIDs for relief of chronic conditions such as arthritis and tendinitis. A review of treatment with NSAID creams found that they worked as well as oral medications but without the serious side effects. PREVENTION  Maintain a healthy weight. Extra pounds put more strain on your joints.  Get strong, stay limber. Weak muscles are a common cause of knee injuries. Stretching is important. Include flexibility exercises in your workouts.  Be smart about exercise. If you have osteoarthritis, chronic knee pain or recurring injuries, you may need to change the way you exercise. This does not mean you have to stop being active. If your knees ache after jogging or playing basketball, consider switching to swimming, water aerobics, or other low-impact activities, at least for a few days a week. Sometimes limiting high-impact activities will provide relief.  Make sure your shoes fit well. Choose footwear that is right for your sport.  Protect your knees. Use the proper gear for knee-sensitive activities. Use kneepads when playing volleyball or laying carpet. Buckle your seat belt every time you drive. Most shattered kneecaps occur in car accidents.  Rest when you are tired. SEEK MEDICAL CARE IF:  You have knee pain that is continual and does not seem  to be getting better.  SEEK IMMEDIATE MEDICAL CARE IF:  Your knee joint feels hot to the touch and you have a high fever. MAKE SURE YOU:   Understand these instructions.  Will watch your condition.  Will get help right away if you are not doing well or get worse. Document Released: 06/13/2007 Document Revised: 11/08/2011 Document Reviewed: 06/13/2007 [facility] Patient Information 2015 [place], [place]. This information is not intended to replace advice given to you by your health care provider. Make sure you discuss any questions you have with your health care provider.   Emergency Department Resource Guide 1) Find a Doctor and Pay Out  of Pocket Although you won't have to find out who is covered by your insurance plan, it is a good idea to ask around and get recommendations. You will then need to call the office and see if the doctor you have chosen will accept you as a new patient and what types of options they offer for patients who are self-pay. Some doctors offer discounts or will set up payment plans for their patients who do not have insurance, but you will need to ask so you aren't surprised when you get to your appointment.  2) Contact Your Local Health Department Not all health departments have doctors that can see patients for sick visits, but many do, so it is worth a call to see if yours does. If you don't know where your local health department is, you can check in your phone book. The CDC also has a tool to help you locate your state's health department, and many state websites also have listings of all of their local health departments.  3) Find a Walk-in Clinic If your illness is not likely to be very severe or complicated, you may want to try a walk in clinic. These are popping up all over the country in pharmacies, drugstores, and shopping centers. They're usually staffed by nurse practitioners or physician assistants that have been trained to treat common illnesses and complaints. They're usually fairly quick and inexpensive. However, if you have serious medical issues or chronic medical problems, these are probably not your best option.  No Primary Care Doctor: - Call Health Connect at  [phone] - they can help you locate a primary care doctor that  accepts your insurance, provides certain services, etc. - Physician Referral Service- [phone]  Chronic Pain Problems: Organization         Address  Phone   Notes  [facility]  [phone] Patients need to be referred by their primary care doctor.   Medication Assistance: Organization         Address  Phone   Notes  [facility]  Medication [facility] [address] [place] [place], [place] [address] [phone] --Must be a resident of [facility] -- Must have NO insurance coverage whatsoever (no Medicaid/ Medicare, etc.) -- The pt. MUST have a primary care doctor that directs their care regularly and follows them in the community   [facility]  [phone]   [employer]  [phone]    Agencies that provide inexpensive medical care: Organization         Address  Phone   Notes  [facility]  [phone]   [facility]    [phone]   [facility] [address] [place], [place] [address] [phone]   [facility] [address] [place]  [place] [phone]   Planned Parenthood    [phone]   [facility]    [phone]   [facility]  [address], [place] Phone:  [phone], Fax:  [phone] Hours of Operation:  9 am - 6 pm, M-F.  Also accepts Medicaid/Medicare and self-pay.  [facility]  [address], [place] Phone: [phone], Fax: [phone]. Hours of Operation:  8:30 am - 5:30 pm, M-F.  Also accepts Medicaid and self-pay.  [facility] [place] [address] [place] Phone: [phone]   [facility] [address] Natasha Bence [place], [place] [phone], Ext. [phone] Mondays & Thursdays: 7-9 AM.  First 15 patients are seen on a first come, first serve basis.    Medicaid-accepting [facility] Providers:  Organization         Address  Phone   Notes  [facility] [address], [place] [phone] Also accepts self-pay patients.  [facility] [address] Laurell Josephs [place], [place]  [phone]   [facility] [address], [place] [phone]   [facility] [address], [place] [phone]   Renaye Rakers [address], [place]   [phone] Only accepts [place] Access [place] patients after they have their name applied to their card.   Self-Pay (no insurance) in [facility]:  Organization         Address  Phone   Notes  Sickle Cell Patients, [facility] [address] [place], [place] [phone]   [facility] [address] [place], [place] [phone]   [facility] [place]  [address], [place] [phone]   [facility]/Dr. Osei-Bonsu  [address], [place] or [address], [place] [phone] Phone number for both [place] and [place] locations is the same.  [facility] [address] [place] [phone]   [facility] [address] [place] or [address] [phone]   [facility] [address], [place] [phone], phone; [phone], fax Sees patients 1st and 3rd Saturday of every month.  Must not qualify for public or private insurance (i.e. Medicaid, Medicare, [place] Health Choice, Veterans' Benefits)  Household income should be no more than 200% of the poverty level The clinic cannot treat you if you are pregnant or think you are pregnant  Sexually transmitted diseases are not treated at the clinic.    Dental Care: Organization         Address  Phone  Notes  [facility] Department of [facility] [address] [place], [place] [phone] Accepts children up to age 6 who are enrolled in [place] or [place] Health Choice; pregnant women with a Medicaid card; and children who have applied for Medicaid or [place] Health Choice, but were declined, whose parents can pay a reduced fee at time of service.  [facility]  [address], [place]  [phone] Accepts children up to age 38 who are enrolled in [place] or [place] Health Choice; pregnant women with a Medicaid card; and children who have applied for Medicaid or [place]  Health Choice, but were declined, whose parents can pay a reduced fee at time of service.  [facility]  [address] [place], [place] [phone] Patients are seen by appointment only. Walk-ins are not accepted. [facility] will see patients 73 years of age and older. Monday - Tuesday (8am-5pm) Most Wednesdays (8:30-5pm) $30 per visit, cash only  [facility]  [address], [facility] [phone] Patients are seen by appointment only. Walk-ins are not accepted. [facility] will see patients 79 years of age and older. One Wednesday Evening (Monthly: Volunteer Based).  $30 per visit, cash only  [employer]  [phone] for adults; Children under age 9, call Graduate Pediatric Dentistry at [phone]. Children aged 39-14, please call [phone] to request a pediatric application.  Dental services are provided in all areas of dental care including fillings, crowns and bridges, complete and partial dentures, implants, gum treatment, root canals, and extractions. Preventive care is also provided. Treatment is provided to both adults and children. Patients are selected via a lottery and there is often a waiting list.   [facility] [address], [place]  [phone] [email or web address]   [facility] [address] [place], [place] [phone], Ext. [phone] Second and Fourth Thursday of each month, opens at 6:30 AM; Clinic ends at 9 AM.  Patients are seen on a first-come first-served basis, and a limited number are seen during each clinic.   [facility]  [address] Ether Griffins [place], [place] [phone]   Eligibility Requirements You must have lived in [place], [place], or [place]  counties for at least the last three months.   You cannot be eligible for state or federal sponsored [place], including [employer], [place], or [employer].   You generally cannot be eligible for healthcare insurance through your employer.    How to apply: Eligibility screenings are held every Tuesday and Wednesday afternoon from 1:00 pm until 4:00 pm. You do not need an appointment for the interview!  [facility] [address] [place], [place] [phone]   [facility] [place] [facility] Health Department  [phone]   [facility]  [phone]   [facility]  [phone]    Behavioral Health Resources in the Community: Intensive Outpatient Programs Organization         Address  Phone  Notes  [facility] [address] [place], [place] [phone]   [facility] Outpatient [address] [place], [place] [phone]   [facility] [address], [place], [place]  [phone]   [facility] [address],  [place], [place] [phone] or [phone]   Substance Abuse Resources Organization         Address  Phone  Notes  [facility] Drug Services  [phone]   [facility]  [phone]   [facility]  [phone]   Floydene Flock  [phone]   Residential & Outpatient Substance Abuse Program  [phone]   Psychological Services Organization         Address  Phone  Notes  [facility] Health  [phone]   [facility] Services  [phone]   [facility] [address] [place] [phone] or [phone]    Mobile Crisis Teams Organization         Address  Phone  Notes  [facility], Mobile Crisis Care Unit  [phone]   [facility]  [address] [place], [place] [phone]   [facility] [address] [place]  [place] [phone]    Self-Help/Support Groups Organization         Address  Phone             Notes  [facility] [place] - variety of support groups  336- I7437963 Call for more information  Narcotics Anonymous (NA), [facility] [address], [employer] [place]  2 meetings at this location   Statistician         Address  Phone  Notes  [facility] [address],    [place]  [phone]   [facility]  [address] [place] [address], [place], [place] [phone]   [facility] [address] [place], [place] [phone] Admissions: 8am-3pm M-F  [facility] [address]    [place], [place] [phone]   [facility] [address] [place], [place], [place] [phone]   [facility] [address]  [place], [place] [phone]   [facility] - Intensive Outpatient [address] Laurell Josephs [address], [place], [place] [phone]   [facility] ([facility] [address] [place]  [place], [place] [phone] or [phone]   [facility] ([facility]) [address], [place], [place] [phone] Accepts Medicaid  [facility] [address],  [place] [phone] Substance Abuse/Addiction Treatment   [facility] Organization         Address  Phone  Notes  [facility]  [phone]   Angie Fava, PhD [address] Ervin Knack [place], [place]   [phone] or [phone]   [facility] Behavioral   [address] [place], [place] [phone]   [facility] [address], [place], [place] [phone] Insurance/Medicaid/sponsorship through [facility] and Families [address]                                    [place], [place] [phone] Therapy/tele-psych/case  [facility] [address], [place] [phone]    Dr. Lolly Mustache  [phone]   [facility]  [employer] [facility] Dept. 1) [address] [place] 2) [address] [place] 3)  [address], [place] [phone]  [phone]   [facility] Child Abuse Hotline [phone] or [phone] (After Hours)

## 2014-05-18 NOTE — ED Notes (Signed)
Pt seen outside of the ED lobby in the parking lot.  Notified PA Muthersbaugh.
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
## 2014-05-18 NOTE — ED Notes (Addendum)
Pt ambulated to ED lobby without speaking to staff.  Notified PA Muthersbaugh

## 2014-05-19 NOTE — ED Provider Notes (Signed)
Medical screening examination/treatment/procedure(s) were performed by non-physician practitioner and as supervising physician I was immediately available for consultation/collaboration.   EKG Interpretation None        Candyce Churn III, MD 05/19/14 [phone]

## 2014-05-19 NOTE — ED Provider Notes (Signed)
Medical screening examination/treatment/procedure(s) were performed by non-physician practitioner and as supervising physician I was immediately available for consultation/collaboration.   EKG Interpretation None        Candyce Churn III, MD 05/19/14 1556

## 2015-03-25 ENCOUNTER — Emergency Department (HOSPITAL_COMMUNITY): Payer: Medicaid Other

## 2015-03-25 ENCOUNTER — Encounter (HOSPITAL_COMMUNITY): Payer: Self-pay | Admitting: Emergency Medicine

## 2015-03-25 ENCOUNTER — Emergency Department (HOSPITAL_COMMUNITY)
Admission: EM | Admit: 2015-03-25 | Discharge: 2015-03-25 | Disposition: A | Discharge status: Home / Self Care | Payer: Medicaid Other | Attending: Emergency Medicine | Admitting: Emergency Medicine

## 2015-03-25 DIAGNOSIS — Z791 Long term (current) use of non-steroidal anti-inflammatories (NSAID): Secondary | ICD-10-CM | POA: Diagnosis not present

## 2015-03-25 DIAGNOSIS — S5002XA Contusion of left elbow, initial encounter: Secondary | ICD-10-CM | POA: Insufficient documentation

## 2015-03-25 DIAGNOSIS — S0081XA Abrasion of other part of head, initial encounter: Secondary | ICD-10-CM | POA: Insufficient documentation

## 2015-03-25 DIAGNOSIS — W01198A Fall on same level from slipping, tripping and stumbling with subsequent striking against other object, initial encounter: Secondary | ICD-10-CM | POA: Diagnosis not present

## 2015-03-25 DIAGNOSIS — Y9389 Activity, other specified: Secondary | ICD-10-CM | POA: Diagnosis not present

## 2015-03-25 DIAGNOSIS — S59902A Unspecified injury of left elbow, initial encounter: Secondary | ICD-10-CM | POA: Diagnosis present

## 2015-03-25 DIAGNOSIS — Z91040 Latex allergy status: Secondary | ICD-10-CM | POA: Insufficient documentation

## 2015-03-25 DIAGNOSIS — Z720 Tobacco use: Secondary | ICD-10-CM | POA: Diagnosis not present

## 2015-03-25 DIAGNOSIS — Y9289 Other specified places as the place of occurrence of the external cause: Secondary | ICD-10-CM | POA: Diagnosis not present

## 2015-03-25 DIAGNOSIS — Z8719 Personal history of other diseases of the digestive system: Secondary | ICD-10-CM | POA: Diagnosis not present

## 2015-03-25 DIAGNOSIS — Y998 Other external cause status: Secondary | ICD-10-CM | POA: Insufficient documentation

## 2015-03-25 DIAGNOSIS — Z87448 Personal history of other diseases of urinary system: Secondary | ICD-10-CM | POA: Diagnosis not present

## 2015-03-25 HISTORY — DX: Diverticulitis of intestine, part unspecified, without perforation or abscess without bleeding: K57.92

## 2015-03-25 MED ORDER — HYDROMORPHONE HCL 1 MG/ML IJ SOLN
1.0000 mg | Freq: Once | INTRAMUSCULAR | Status: AC
  Administered 2015-03-25: 1 mg via INTRAMUSCULAR
  Filled 2015-03-25: qty 1

## 2015-03-25 MED ORDER — ONDANSETRON 4 MG PO TBDP
4.0000 mg | ORAL_TABLET | Freq: Once | ORAL | Status: AC
  Administered 2015-03-25: 4 mg via ORAL
  Filled 2015-03-25: qty 1

## 2015-03-25 MED ORDER — OXYCODONE-ACETAMINOPHEN 5-325 MG PO TABS
1.0000 | ORAL_TABLET | Freq: Four times a day (QID) | ORAL | Status: DC | PRN
Start: 2015-03-25 — End: 2015-05-18

## 2015-03-25 MED ORDER — NAPROXEN 500 MG PO TABS: 500.0000 mg | ORAL_TABLET | Freq: Two times a day (BID) | ORAL | Status: DC

## 2015-03-25 NOTE — Discharge Instructions (Signed)
Please read and follow all provided instructions.  Your diagnoses today include:  1. Elbow contusion, left, initial encounter   2. Forehead abrasion, initial encounter     Tests performed today include:  An x-ray of the affected area - does NOT show any broken bones  Vital signs. See below for your results today.   Medications prescribed:   Percocet (oxycodone/acetaminophen) - narcotic pain medication  DO NOT drive or perform any activities that require you to be awake and alert because this medicine can make you drowsy. BE VERY CAREFUL not to take multiple medicines containing Tylenol (also called acetaminophen). Doing so can lead to an overdose which can damage your liver and cause liver failure and possibly death.   Naproxen - anti-inflammatory pain medication  Do not exceed  naproxen every 12 hours, take with food  You have been prescribed an anti-inflammatory medication or NSAID. Take with food. Take smallest effective dose for the shortest duration needed for your pain. Stop taking if you experience stomach pain or vomiting.   Take any prescribed medications only as directed.  Home care instructions:   Follow any educational materials contained in this packet  Follow R.I.C.E. Protocol:  R - rest your injury   I  - use ice on injury without applying directly to skin  C - compress injury with bandage or splint  E - elevate the injury as much as possible  Follow-up instructions: Please follow-up with your primary care provider if you continue to have significant pain in 1 week. In this case you may have a more severe injury that requires further care.   Return instructions:   Please return if your fingers are numb or tingling, appear gray or blue, or you have severe pain (also elevate the arm and loosen splint or wrap if you were given one)  Please return to the Emergency Department if you experience worsening symptoms.   Please return if you have any other  emergent concerns.  Additional Information:  Your vital signs today were: BP 113/73 mmHg   Pulse 71   Temp(Src) 98.2 F (36.8 C) (Oral)   Resp 16   SpO2 96% If your blood pressure (BP) was elevated above 135/85 this visit, please have this repeated by your doctor within one month. --------------

## 2015-03-25 NOTE — ED Provider Notes (Signed)
CSN: [id number]     Arrival date & time 03/25/15  1418 History  This chart was scribed for non-physician practitioner, Renne Crigler, PA-C, working with Blane Ohara, MD by Charline Bills, ED Scribe. This patient was seen in room TR08C/TR08C and the patient's care was started at 2:47 PM.   Chief Complaint  Patient presents with  . Elbow Pain  . Fall   The history is provided by the patient. No language interpreter was used.   HPI Comments: Steven Montoya is a 39 y.o. male who presents to the Emergency Department complaining of a fall that occurred around 7:30 AM. Pt states that he tripped on a drop cord at home and fell on his left elbow. Pt also hit his head during the fall and sustained a laceration to his right eyebrow. Bleeding is controlled. No LOC. He reports constant, throbbing pain in his left elbow that is exacerbated with movement. Pt further reports associated joint swelling and tingling in his left fingers. No treatments tried PTA. He denies numbness, visual disturbances and vomiting.   Past Medical History  Diagnosis Date  . Chronic urethral stricture   . Urgency of urination   . Frequency of urination   . Urinary straining   . Nocturia   . Lower abdominal pain   . Headache(784.0)   . Diverticulitis    Past Surgical History  Procedure Laterality Date  . Cysto/ retrograde pyelogram/ urethral dilation  11-05-2010  . Colonoscopy N/A 08/17/2013    Procedure: COLONOSCOPY;  Surgeon: Romie Levee, MD;  Location: [facility];  Service: Endoscopy;  Laterality: N/A;  . Tooth extraction      canines   Family History  Problem Relation Age of Onset  . Heart disease Father    History  Substance Use Topics  . Smoking status: Current Every Day Smoker -- 0.50 packs/day for 4 years    Types: Cigarettes  . Smokeless tobacco: Current User    Types: Chew     Comment: not chewed tobacco in 3 months  . Alcohol Use: No     Comment: QUIT 2013    Review of Systems  Constitutional:  Negative for fatigue.  HENT: Negative for tinnitus.   Eyes: Negative for photophobia, pain and visual disturbance.  Respiratory: Negative for shortness of breath.   Cardiovascular: Negative for chest pain.  Gastrointestinal: Negative for nausea and vomiting.  Musculoskeletal: Positive for joint swelling and arthralgias. Negative for back pain, gait problem and neck pain.  Skin: Positive for wound.  Neurological: Negative for dizziness, weakness, light-headedness, numbness and headaches.  Psychiatric/Behavioral: Negative for confusion and decreased concentration.   Allergies  Latex and Adhesive  Home Medications   Prior to Admission medications   Medication Sig Start Date End Date Taking? Authorizing Provider  ibuprofen (ADVIL,MOTRIN) 800 MG tablet Take 1 tablet (800 mg total) by mouth 3 (three) times daily. 05/18/14   Ladona Mow, PA-C   BP 109/72 mmHg  Pulse 77  Temp(Src) 98.6 F (37 C) (Oral)  Resp 16  SpO2 96%  Physical Exam  Constitutional: He is oriented to person, place, and time. He appears well-developed and well-nourished. No distress.  HENT:  Head: Normocephalic and atraumatic. Head is without raccoon's eyes and without Battle's sign.  Right Ear: Tympanic membrane, external ear and ear canal normal. No hemotympanum.  Left Ear: Tympanic membrane, external ear and ear canal normal. No hemotympanum.  Nose: Nose normal. No nasal septal hematoma.  Mouth/Throat: Oropharynx is clear and moist.  Small <  1cm superficial non-gaping laceration to R eyebrow which does not require repair.   Eyes: Conjunctivae, EOM and lids are normal. Pupils are equal, round, and reactive to light.  No visible hyphema  Neck: Normal range of motion. Neck supple. No tracheal deviation present.  Cardiovascular: Normal rate, regular rhythm and normal pulses.   Pulses:      Radial pulses are 2+ on the right side, and 2+ on the left side.  Pulmonary/Chest: Effort normal and breath sounds normal. No  respiratory distress.  Abdominal: Soft. There is no tenderness.  Musculoskeletal: He exhibits tenderness. He exhibits no edema.       Left shoulder: Normal.       Left elbow: He exhibits decreased range of motion, swelling and effusion. Tenderness found.       Left wrist: Normal.       Cervical back: He exhibits normal range of motion, no tenderness and no bony tenderness.       Thoracic back: He exhibits no tenderness and no bony tenderness.       Lumbar back: He exhibits no tenderness and no bony tenderness.       Left upper arm: Normal.       Left forearm: Normal.       Left hand: Normal.  Neurological: He is alert and oriented to person, place, and time. He has normal strength and normal reflexes. No cranial nerve deficit or sensory deficit. Coordination normal. GCS eye subscore is 4. GCS verbal subscore is 5. GCS motor subscore is 6.  Motor, sensation, and vascular distal to the injury is fully intact.   Skin: Skin is warm and dry.  Psychiatric: He has a normal mood and affect. His behavior is normal.  Nursing note and vitals reviewed.  ED Course  Procedures (including critical care time) DIAGNOSTIC STUDIES: Oxygen Saturation is 96% on RA, normal by my interpretation.    COORDINATION OF CARE: 2:50 PM-Discussed treatment plan which includes XR and Dilaudid injection with pt at bedside and pt agreed to plan.   Labs Review Labs Reviewed - No data to display  Imaging Review Dg Elbow Complete Left  03/25/2015   CLINICAL DATA:  Status post fall this morning with elbow pain.  EXAM: LEFT ELBOW - COMPLETE 3+ VIEW  COMPARISON:  None.  FINDINGS: There is no evidence of fracture, dislocation, or joint effusion. There is no evidence of arthropathy or other focal bone abnormality. Soft tissues are unremarkable.  IMPRESSION: Negative.   Electronically Signed   By: Sherian Rein M.D.   On: 03/25/2015 15:38    EKG Interpretation None       Vital signs reviewed and are as follows: Filed  Vitals:   03/25/15 1559  BP: 113/73  Pulse: 71  Temp: 98.2 F (36.8 C)  Resp: 16    Pt seen and examined. Pain medication ordered.   Pt updated on x-ray results. Reviewed by myself. He has probable effusion/hemarthrosis on exam. Provided with sling and pain medications. Ortho f/u encouraged.   Patient urged to return with worsening symptoms or other concerns. Patient verbalized understanding and agrees with plan. Exam unchanged. He has normal distal sensation and pulses.   Patient counseled on use of narcotic pain medications. Counseled not to combine these medications with others containing tylenol. Urged not to drink alcohol, drive, or perform any other activities that requires focus while taking these medications. The patient verbalizes understanding and agrees with the plan.   MDM   Final diagnoses:  Elbow  contusion, left, initial encounter  Forehead abrasion, initial encounter   Elbow contusion/hemarthrosis: neg x-ray. Treat with sling, pain control, ortho f/u. UE is neurovascularly intact.   Forehead abrasion: No sign of closed head injury. No indication for CT per canadian head CT rules. No repair required.   I personally performed the services described in this documentation, which was scribed in my presence. The recorded information has been reviewed and is accurate.    Renne Crigler, PA-C 03/27/15 1248  Blane Ohara, MD 04/01/15 [phone]

## 2015-03-25 NOTE — ED Notes (Signed)
Tripped over drop cord at home this am, fell hit head and left elbow. Elbow swollen, unable to move-- positive radial pulse. No loc

## 2015-05-18 ENCOUNTER — Emergency Department (HOSPITAL_COMMUNITY)
Admission: EM | Admit: 2015-05-18 | Discharge: 2015-05-18 | Disposition: A | Discharge status: Home / Self Care | Payer: Medicaid Other | Attending: Emergency Medicine | Admitting: Emergency Medicine

## 2015-05-18 ENCOUNTER — Encounter (HOSPITAL_COMMUNITY): Payer: Self-pay | Admitting: Emergency Medicine

## 2015-05-18 ENCOUNTER — Emergency Department (HOSPITAL_COMMUNITY): Payer: Medicaid Other

## 2015-05-18 DIAGNOSIS — M238X1 Other internal derangements of right knee: Secondary | ICD-10-CM | POA: Diagnosis not present

## 2015-05-18 DIAGNOSIS — Z91040 Latex allergy status: Secondary | ICD-10-CM | POA: Insufficient documentation

## 2015-05-18 DIAGNOSIS — Z791 Long term (current) use of non-steroidal anti-inflammatories (NSAID): Secondary | ICD-10-CM | POA: Insufficient documentation

## 2015-05-18 DIAGNOSIS — Z720 Tobacco use: Secondary | ICD-10-CM | POA: Insufficient documentation

## 2015-05-18 DIAGNOSIS — M2391 Unspecified internal derangement of right knee: Secondary | ICD-10-CM

## 2015-05-18 DIAGNOSIS — M25561 Pain in right knee: Secondary | ICD-10-CM | POA: Diagnosis present

## 2015-05-18 MED ORDER — HYDROCODONE-ACETAMINOPHEN 5-325 MG PO TABS: 1.0000 | ORAL_TABLET | Freq: Four times a day (QID) | ORAL | Status: DC | PRN

## 2015-05-18 MED ORDER — NAPROXEN 500 MG PO TABS: 500.0000 mg | ORAL_TABLET | Freq: Two times a day (BID) | ORAL | Status: DC

## 2015-05-18 MED ORDER — HYDROCODONE-ACETAMINOPHEN 5-325 MG PO TABS
1.0000 | ORAL_TABLET | Freq: Once | ORAL | Status: AC
  Administered 2015-05-18: 1 via ORAL
  Filled 2015-05-18: qty 1

## 2015-05-18 NOTE — Discharge Instructions (Signed)
Elastic Bandage and RICE °Elastic bandages come in different shapes and sizes. They perform different functions. Your caregiver will help you to decide what is best for your protection, recovery, or rehabilitation following an injury. The following are some general tips to help you use an elastic bandage. °· Use the bandage as directed by the maker of the bandage you are using. °· Do not wrap it too tight. This may cut off the circulation of the arm or leg below the bandage. °· If part of your body beyond the bandage becomes blue, numb, or swollen, it is too tight. Loosen the bandage as needed to prevent these problems. °· See your caregiver or trainer if the bandage seems to be making your problems worse rather than better. °Bandages may be a reminder to you that you have an injury. However, they provide very little support. The few pounds of support they provide are minor considering the pressure it takes to injure a joint or tear ligaments. Therefore, the joint will not be able to handle all of the wear and tear it could before the injury. °The routine care of many injuries includes Rest, Ice, Compression, and Elevation (RICE). °· Rest is required to allow your body to heal. Generally, routine activities can be resumed when comfortable. Injured tendons and bones take about 6 weeks to heal. °· Icing the injury helps keep the swelling down and reduces pain. Do not apply ice directly to the skin. Put ice in a plastic bag. Place a towel between the skin and the bag. This will prevent frostbite to the skin. Apply ice bags to the injured area for 15-20 minutes, every 2 hours while awake. Do this for the first 24 to 48 hours, then as directed by your caregiver. °· Compression helps keep swelling down, gives support, and helps with discomfort. If an elastic bandage has been applied today, it should be removed and reapplied every 3 to 4 hours. It should not be applied tightly, but firmly enough to keep swelling down.  Watch fingers or toes for swelling, bluish discoloration, coldness, numbness, or increased pain. If any of these problems occur, remove the bandage and reapply it more loosely. If these problems persist, contact your caregiver. °· Elevation helps reduce swelling and decreases pain. The injured area (arms, hands, legs, or feet) should be placed near to or above the heart (center of the chest) if able. °Persistent pain and inability to use the injured area for more than 2 to 3 days are warning signs. You should see a caregiver for a follow-up visit as soon as possible. Initially, a minor broken bone (hairline fracture) may not be seen on X-rays. It may take 7 to 10 days to finally show up. Continued pain and swelling show that further evaluation and/or X-rays are needed. Make a follow-up visit with your caregiver. A specialist in reading X-rays (radiologist) will read your X-rays again. °Finding out the results of your test °Not all test results are available during your visit. If your test results are not back during the visit, make an appointment with your caregiver to find out the results. Do not assume everything is normal if you have not heard from your caregiver or the medical facility. It is important for you to follow up on all of your test results. °Document Released: 02/05/2002 Document Revised: 11/08/2011 Document Reviewed: 12/18/2007 °[employer]® Patient Information ©2015 [employer]. This information is not intended to replace advice given to you by your health care provider. Make sure   you discuss any questions you have with your health care provider.   Knee Effusion The medical term for having fluid in your knee is effusion. This is often due to an internal derangement of the knee. This means something is wrong inside the knee. Some of the causes of fluid in the knee may be torn cartilage, a torn ligament, or bleeding into the joint from an injury. Your knee is likely more difficult to bend and move.  This is often because there is increased pain and pressure in the joint. The time it takes for recovery from a knee effusion depends on different factors, including:   Type of injury.  Your age.  Physical and medical conditions.  Rehabilitation Strategies. How [place] you will be away from your normal activities will depend on what kind of knee problem you have and how much damage is present. Your knee has two types of cartilage. Articular cartilage covers the bone ends and lets your knee bend and move smoothly. Two menisci, thick pads of cartilage that form a rim inside the joint, help absorb shock and stabilize your knee. Ligaments bind the bones together and support your knee joint. Muscles move the joint, help support your knee, and take stress off the joint itself. CAUSES  Often an effusion in the knee is caused by an injury to one of the menisci. This is often a tear in the cartilage. Recovery after a meniscus injury depends on how much meniscus is damaged and whether you have damaged other knee tissue. Small tears may heal on their own with conservative treatment. Conservative means rest, limited weight bearing activity and muscle strengthening exercises. Your recovery may take up to 6 weeks.  TREATMENT  Larger tears may require surgery. Meniscus injuries may be treated during arthroscopy. Arthroscopy is a procedure in which your surgeon uses a small telescope like instrument to look in your knee. Your caregiver can make a more accurate diagnosis (learning what is wrong) by performing an arthroscopic procedure. If your injury is on the inner margin of the meniscus, your surgeon may trim the meniscus back to a smooth rim. In other cases your surgeon will try to repair a damaged meniscus with stitches (sutures). This may make rehabilitation take longer, but may provide better Spires term result by helping your knee keep its shock absorption capabilities. Ligaments which are completely torn usually  require surgery for repair. HOME CARE INSTRUCTIONS  Use crutches as instructed.  If a brace is applied, use as directed.  Once you are home, an ice pack applied to your swollen knee may help with discomfort and help decrease swelling.  Keep your knee raised (elevated) when you are not up and around or on crutches.  Only take over-the-counter or prescription medicines for pain, discomfort, or fever as directed by your caregiver.  Your caregivers will help with instructions for rehabilitation of your knee. This often includes strengthening exercises.  You may resume a normal diet and activities as directed. SEEK MEDICAL CARE IF:   There is increased swelling in your knee.  You notice redness, swelling, or increasing pain in your knee.  An unexplained oral temperature above 102 F (38.9 C) develops. SEEK IMMEDIATE MEDICAL CARE IF:   You develop a rash.  You have difficulty breathing.  You have any allergic reactions from medications you may have been given.  There is severe pain with any motion of the knee. MAKE SURE YOU:   Understand these instructions.  Will watch your condition.  Will get help right away if you are not doing well or get worse. Document Released: 11/06/2003 Document Revised: 11/08/2011 Document Reviewed: 01/10/2008 [facility] Patient Information 2015 [place], [place]. This information is not intended to replace advice given to you by your health care provider. Make sure you discuss any questions you have with your health care provider.

## 2015-05-18 NOTE — ED Provider Notes (Signed)
CSN: [id number]     Arrival date & time 05/18/15  1749 History   First MD Initiated Contact with Patient 05/18/15 1810     Chief Complaint  Patient presents with  . Leg Injury     (Consider location/radiation/quality/duration/timing/severity/associated sxs/prior Treatment) HPI   39 year old male presents for evaluation of right knee injury. Patient states he was pain football earlier this morning when he was hit to the lateral aspects of his right knee by someone's helmet. He felt a pop and fell down to the ground. Since then he has had persistent throbbing pain to the right knee unable to bear weight. Pain is moderate to severe. He noticed that his knee is swollen. He denies any hip or ankle pain. Report similar injury to his knee and years ago. He does not have an orthopedist. He denies any numbness or weakness.  Past Medical History  Diagnosis Date  . Chronic urethral stricture   . Urgency of urination   . Frequency of urination   . Urinary straining   . Nocturia   . Lower abdominal pain   . Headache(784.0)   . Diverticulitis    Past Surgical History  Procedure Laterality Date  . Cysto/ retrograde pyelogram/ urethral dilation  11-05-2010  . Colonoscopy N/A 08/17/2013    Procedure: COLONOSCOPY;  Surgeon: Romie Levee, MD;  Location: [facility];  Service: Endoscopy;  Laterality: N/A;  . Tooth extraction      canines   Family History  Problem Relation Age of Onset  . Heart disease Father    Social History  Substance Use Topics  . Smoking status: Current Every Day Smoker -- 0.50 packs/day for 4 years    Types: Cigarettes  . Smokeless tobacco: Current User    Types: Chew     Comment: not chewed tobacco in 3 months  . Alcohol Use: No     Comment: QUIT 2013    Review of Systems  Constitutional: Negative for fever.  Musculoskeletal: Positive for joint swelling and arthralgias.  Skin: Negative for rash and wound.  Neurological: Negative for numbness.       Allergies  Latex and Adhesive  Home Medications   Prior to Admission medications   Medication Sig Start Date End Date Taking? Authorizing Teala Daffron  ibuprofen (ADVIL,MOTRIN) 800 MG tablet Take 1 tablet (800 mg total) by mouth 3 (three) times daily. 05/18/14   Ladona Mow, PA-C  naproxen (NAPROSYN) 500 MG tablet Take 1 tablet (500 mg total) by mouth 2 (two) times daily. 03/25/15   Renne Crigler, PA-C  oxyCODONE-acetaminophen (PERCOCET/ROXICET) 5-325 MG per tablet Take 1-2 tablets by mouth every 6 (six) hours as needed for severe pain. 03/25/15   Renne Crigler, PA-C   BP 143/82 mmHg  Pulse 82  Temp(Src) 97.9 F (36.6 C) (Oral)  Resp 16  Ht  (1.803 m)  Wt 165 lb (74.844 kg)  BMI 23.02 kg/m2  SpO2 98% Physical Exam  Constitutional: He appears well-developed and well-nourished. No distress.  Multiple tattoos throughout body.  HENT:  Head: Atraumatic.  Eyes: Conjunctivae are normal.  Neck: Neck supple.  Cardiovascular: Intact distal pulses.   Musculoskeletal: He exhibits tenderness (Right knee: Moderate effusion noted to knees, tenderness to both medial and lateral joint line. Pain with varus and valgus maneuver. Normal anterior and posterior drawer test. No gross deformity.).  Right hip and right ankle are nontender.  Neurological: He is alert.  Skin: No rash noted.  Psychiatric: He has a normal mood and affect.  Nursing  note and vitals reviewed.   ED Course  Procedures (including critical care time)  Patient presents with right knee injury when he received a blow to the lateral aspects of his knee. This is concerning for an MCL tear or meniscal injury. X-ray shows effusion but no acute fractures or dislocation. Patient is neurovascularly intact. A knee immobilizer and crutches provided along with pain medication. Patient will follow up with orthopedist for further management.  Labs Review Labs Reviewed - No data to display  Imaging Review Dg Knee Complete 4 Views  Right  05/18/2015   CLINICAL DATA:  Fall and injury at 1030 after knee was hit by a a football helment pain in lateral right knee No prior surgery.swollen and sore to touch  EXAM: RIGHT KNEE - COMPLETE 4+ VIEW  COMPARISON:  None.  FINDINGS: No fracture. Joint normally space and aligned. No arthropathic change. Small joint effusion. Mild anterior soft tissue edema.  IMPRESSION: No fracture or dislocation.  Small joint effusion.   Electronically Signed   By: Amie Portland M.D.   On: 05/18/2015 18:30   I have personally reviewed and evaluated these images and lab results as part of my medical decision-making.   EKG Interpretation None      MDM   Final diagnoses:  Acute internal derangement of right knee    BP 143/82 mmHg  Pulse 82  Temp(Src) 97.9 F (36.6 C) (Oral)  Resp 16  Ht  (1.803 m)  Wt 165 lb (74.844 kg)  BMI 23.02 kg/m2  SpO2 98%     Fayrene Helper, PA-C 05/18/15 1856  Richardean Canal, MD 05/18/15 2210

## 2015-05-18 NOTE — ED Notes (Signed)
Pt c/o right leg/ knee pain onset around 1030 this morning. Pt reports that he was playing football. Pt ambulatory to triage with a limp

## 2015-06-14 ENCOUNTER — Emergency Department (HOSPITAL_COMMUNITY)
Admission: EM | Admit: 2015-06-14 | Discharge: 2015-06-14 | Disposition: A | Discharge status: Home / Self Care | Payer: Medicaid Other | Attending: Emergency Medicine | Admitting: Emergency Medicine

## 2015-06-14 ENCOUNTER — Encounter (HOSPITAL_COMMUNITY): Payer: Self-pay

## 2015-06-14 DIAGNOSIS — M25561 Pain in right knee: Secondary | ICD-10-CM

## 2015-06-14 DIAGNOSIS — Z791 Long term (current) use of non-steroidal anti-inflammatories (NSAID): Secondary | ICD-10-CM | POA: Insufficient documentation

## 2015-06-14 DIAGNOSIS — Z87828 Personal history of other (healed) physical injury and trauma: Secondary | ICD-10-CM | POA: Insufficient documentation

## 2015-06-14 DIAGNOSIS — Z91040 Latex allergy status: Secondary | ICD-10-CM | POA: Insufficient documentation

## 2015-06-14 DIAGNOSIS — M25461 Effusion, right knee: Secondary | ICD-10-CM | POA: Diagnosis not present

## 2015-06-14 DIAGNOSIS — Z8719 Personal history of other diseases of the digestive system: Secondary | ICD-10-CM | POA: Insufficient documentation

## 2015-06-14 DIAGNOSIS — Z87438 Personal history of other diseases of male genital organs: Secondary | ICD-10-CM | POA: Diagnosis not present

## 2015-06-14 DIAGNOSIS — M2540 Effusion, unspecified joint: Secondary | ICD-10-CM

## 2015-06-14 DIAGNOSIS — Z720 Tobacco use: Secondary | ICD-10-CM | POA: Diagnosis not present

## 2015-06-14 MED ORDER — HYDROCODONE-ACETAMINOPHEN 5-325 MG PO TABS: 1.0000 | ORAL_TABLET | Freq: Four times a day (QID) | ORAL | Status: DC | PRN

## 2015-06-14 MED ORDER — NAPROXEN 375 MG PO TABS: 375.0000 mg | ORAL_TABLET | Freq: Two times a day (BID) | ORAL | Status: DC

## 2015-06-14 MED ORDER — HYDROCODONE-ACETAMINOPHEN 5-325 MG PO TABS
1.0000 | ORAL_TABLET | Freq: Once | ORAL | Status: AC
  Administered 2015-06-14: 1 via ORAL
  Filled 2015-06-14: qty 1

## 2015-06-14 NOTE — ED Provider Notes (Signed)
CSN: [id number]     Arrival date & time 06/14/15  1747 History  By signing my name below, I, Lyndel SafeKaitlyn Shelton, attest that this documentation has been prepared under the direction and in the presence of Arthor CaptainAbigail Faiza Bansal, PA-C. Electronically Signed: Lyndel SafeKaitlyn Shelton, ED Scribe. 06/14/2015. 6:23 PM.    Chief Complaint  Patient presents with  . Knee Pain   The history is provided by the patient. No language interpreter was used.   HPI Comments: Steven Montoya is a 39 y.o. male who presents to the Emergency Department for referral to orthopaedics for constant, severe right knee pain and swelling s/p twist injury 1 month ago. The pt was seen in the ED 1 month ago for same c/o right knee pain s/p twist injury while playing football. On this ED visit on 9/18 his exam was concerning for an MCL tear or meniscal injury and the pt was provided with pain medication, a knee immobilizer, crutches, and directions to follow up with an orthopaedist . Pt states there was not a referral in his discharge papers and the orthopaedist would not see him due no referral. He reports increased pain with weight bearing and ROM of right knee. Pt additionally states he does not wear the knee immobilizer as it is uncomfortable and he has not been taking his Naprosyn due to the associated dyspepsia.   Past Medical History  Diagnosis Date  . Chronic urethral stricture   . Urgency of urination   . Frequency of urination   . Urinary straining   . Nocturia   . Lower abdominal pain   . Headache(784.0)   . Diverticulitis    Past Surgical History  Procedure Laterality Date  . Cysto/ retrograde pyelogram/ urethral dilation  11-05-2010  . Colonoscopy N/A 08/17/2013    Procedure: COLONOSCOPY;  Surgeon: Romie LeveeAlicia Thomas, MD;  Location: [facility];  Service: Endoscopy;  Laterality: N/A;  . Tooth extraction      canines   Family History  Problem Relation Age of Onset  . Heart disease Father    Social History  Substance Use  Topics  . Smoking status: Current Every Day Smoker -- 0.50 packs/day for 4 years    Types: Cigarettes  . Smokeless tobacco: Current User    Types: Chew     Comment: not chewed tobacco in 3 months  . Alcohol Use: No     Comment: QUIT 2013    Review of Systems  Musculoskeletal: Positive for joint swelling ( right knee) and arthralgias ( right knee).   Allergies  Latex and Adhesive  Home Medications   Prior to Admission medications   Medication Sig Start Date End Date Taking? Authorizing Provider  HYDROcodone-acetaminophen (NORCO/VICODIN) 5-325 MG per tablet Take 1 tablet by mouth every 6 (six) hours as needed for severe pain. 05/18/15   Fayrene HelperBowie Tran, PA-C  ibuprofen (ADVIL,MOTRIN) 800 MG tablet Take 1 tablet (800 mg total) by mouth 3 (three) times daily. 05/18/14   Ladona MowJoe Mintz, PA-C  naproxen (NAPROSYN) 500 MG tablet Take 1 tablet (500 mg total) by mouth 2 (two) times daily. 05/18/15   Fayrene HelperBowie Tran, PA-C   BP 133/79 mmHg  Pulse 81  Temp(Src) 98.1 F (36.7 C) (Oral)  Resp 18  SpO2 97% Physical Exam  Constitutional: He is oriented to person, place, and time. He appears well-developed and well-nourished. No distress.  HENT:  Head: Normocephalic.  Eyes: Conjunctivae are normal.  Neck: Normal range of motion. Neck supple.  Cardiovascular: Normal rate.   Pulmonary/Chest:  Effort normal. No respiratory distress.  Musculoskeletal: Normal range of motion. He exhibits tenderness.  Effusion of right knee, exquisitely tender with any movement, positive anterior drawer sign, NVI.   Neurological: He is alert and oriented to person, place, and time. Coordination normal.  Skin: Skin is warm.  Psychiatric: He has a normal mood and affect. His behavior is normal.  Nursing note and vitals reviewed.   ED Course  Procedures  DIAGNOSTIC STUDIES: Oxygen Saturation is 97% on RA, normal by my interpretation.    COORDINATION OF CARE: 6:21 PM Discussed treatment plan with pt at bedside and pt agreed to  plan. Will provide pt with a knee sleeve and a referral to oncall orthopaedist.    MDM   Final diagnoses:  Knee pain, right  Joint effusion    Patient unable to follow up with the doctor to whom he was referred.  I suspect ACL  Tear and his knee is unstable on exam.  i have referred the patient back to his original referral and to on call  ortho. i have given the patient the flow manager's number should he have difficulty. Pain meds, knee sleeve and RICE.   Lucila Maine, personally performed the services described in this documentation. All medical record entries made by the scribe were at my direction and in my presence.  I have reviewed the chart and discharge instructions and agree that the record reflects my personal performance and is accurate and complete. Arthor Captain.  06/14/2015. 8:15 PM.       Arthor Captain, PA-C 06/14/15 2016  Arby Barrette, MD 06/21/15 [phone]

## 2015-06-14 NOTE — Discharge Instructions (Signed)
Cryotherapy °Cryotherapy means treatment with cold. Ice or gel packs can be used to reduce both pain and swelling. Ice is the most helpful within the first 24 to 48 hours after an injury or flare-up from overusing a muscle or joint. Sprains, strains, spasms, burning pain, shooting pain, and aches can all be eased with ice. Ice can also be used when recovering from surgery. Ice is effective, has very few side effects, and is safe for most people to use. °PRECAUTIONS  °Ice is not a safe treatment option for people with: °· Raynaud phenomenon. This is a condition affecting small blood vessels in the extremities. Exposure to cold may cause your problems to return. °· Cold hypersensitivity. There are many forms of cold hypersensitivity, including: °· Cold urticaria. Red, itchy hives appear on the skin when the tissues begin to warm after being iced. °· Cold erythema. This is a red, itchy rash caused by exposure to cold. °· Cold hemoglobinuria. Red blood cells break down when the tissues begin to warm after being iced. The hemoglobin that carry oxygen are passed into the urine because they cannot combine with blood proteins fast enough. °· Numbness or altered sensitivity in the area being iced. °If you have any of the following conditions, do not use ice until you have discussed cryotherapy with your caregiver: °· Heart conditions, such as arrhythmia, angina, or chronic heart disease. °· High blood pressure. °· Healing wounds or open skin in the area being iced. °· Current infections. °· Rheumatoid arthritis. °· Poor circulation. °· Diabetes. °Ice slows the blood flow in the region it is applied. This is beneficial when trying to stop inflamed tissues from spreading irritating chemicals to surrounding tissues. However, if you expose your skin to cold temperatures for too [place] or without the proper protection, you can damage your skin or nerves. Watch for signs of skin damage due to cold. °HOME CARE INSTRUCTIONS °Follow  these tips to use ice and cold packs safely. °· Place a dry or damp towel between the ice and skin. A damp towel will cool the skin more quickly, so you may need to shorten the time that the ice is used. °· For a more rapid response, add gentle compression to the ice. °· Ice for no more than 10 to 20 minutes at a time. The bonier the area you are icing, the less time it will take to get the benefits of ice. °· Check your skin after 5 minutes to make sure there are no signs of a poor response to cold or skin damage. °· Rest 20 minutes or more between uses. °· Once your skin is numb, you can end your treatment. You can test numbness by very lightly touching your skin. The touch should be so light that you do not see the skin dimple from the pressure of your fingertip. When using ice, most people will feel these normal sensations in this order: cold, burning, aching, and numbness. °· Do not use ice on someone who cannot communicate their responses to pain, such as small children or people with dementia. °HOW TO MAKE AN ICE PACK °Ice packs are the most common way to use ice therapy. Other methods include ice massage, ice baths, and cryosprays. Muscle creams that cause a cold, tingly feeling do not offer the same benefits that ice offers and should not be used as a substitute unless recommended by your caregiver. °To make an ice pack, do one of the following: °· Place crushed ice or a   bag of frozen vegetables in a sealable plastic bag. Squeeze out the excess air. Place this bag inside another plastic bag. Slide the bag into a pillowcase or place a damp towel between your skin and the bag.  Mix 3 parts water with 1 part rubbing alcohol. Freeze the mixture in a sealable plastic bag. When you remove the mixture from the freezer, it will be slushy. Squeeze out the excess air. Place this bag inside another plastic bag. Slide the bag into a pillowcase or place a damp towel between your skin and the bag. SEEK MEDICAL CARE  IF:  You develop white spots on your skin. This may give the skin a blotchy (mottled) appearance.  Your skin turns blue or pale.  Your skin becomes waxy or hard.  Your swelling gets worse. MAKE SURE YOU:   Understand these instructions.  Will watch your condition.  Will get help right away if you are not doing well or get worse.   This information is not intended to replace advice given to you by your health care provider. Make sure you discuss any questions you have with your health care provider.   Document Released: 04/12/2011 Document Revised: 09/06/2014 Document Reviewed: 04/12/2011 Elsevier Interactive Patient Education 2016 Else[employer] Inc.    Knee Pain Knee pain is a very common symptom and can have many causes. Knee pain often goes away when you follow your health care provider's instructions for relieving pain and discomfort at home. However, knee pain can develop into a condition that needs treatment. Some conditions may include:  Arthritis caused by wear and tear (osteoarthritis).  Arthritis caused by swelling and irritation (rheumatoid arthritis or gout).  A cyst or growth in your knee.  An infection in your knee joint.  An injury that will not heal.  Damage, swelling, or irritation of the tissues that support your knee (torn ligaments or tendinitis). If your knee pain continues, additional tests may be ordered to diagnose your condition. Tests may include X-rays or other imaging studies of your knee. You may also need to have fluid removed from your knee. Treatment for ongoing knee pain depends on the cause, but treatment may include:  Medicines to relieve pain or swelling.  Steroid injections in your knee.  Physical therapy.  Surgery. HOME CARE INSTRUCTIONS  Take medicines only as directed by your health care provider.  Rest your knee and keep it raised (elevated) while you are resting.  Do not do things that cause or worsen pain.  Avoid high-impact  activities or exercises, such as running, jumping rope, or doing jumping jacks.  Apply ice to the knee area:  Put ice in a plastic bag.  Place a towel between your skin and the bag.  Leave the ice on for 20 minutes, 2-3 times a day.  Ask your health care provider if you should wear an elastic knee support.  Keep a pillow under your knee when you sleep.  Lose weight if you are overweight. Extra weight can put pressure on your knee.  Do not use any tobacco products, including cigarettes, chewing tobacco, or electronic cigarettes. If you need help quitting, ask your health care provider. Smoking may slow the healing of any bone and joint problems that you may have. SEEK MEDICAL CARE IF:  Your knee pain continues, changes, or gets worse.  You have a fever along with knee pain.  Your knee buckles or locks up.  Your knee becomes more swollen. SEEK IMMEDIATE MEDICAL CARE IF:  Your knee joint feels hot to the touch.  You have chest pain or trouble breathing.   This information is not intended to replace advice given to you by your health care provider. Make sure you discuss any questions you have with your health care provider.   Document Released: 06/13/2007 Document Revised: 09/06/2014 Document Reviewed: 04/01/2014 Elsevier Interactive Patient Education 2016 Elsevier Inc.     Anterior Cruciate Ligament Reconstruction Anterior cruciate ligament (ACL) reconstruction is a surgical procedure to replace a torn ACL. A ligament is a strong, fibrous, cordlike tissue that connects your bones. Your ACL guides your shin bone (tibia) as it moves in your knee joint. When your ACL is torn, your knee joint loses stability. During ACL reconstruction, a tissue from a ligament from another part of your body or from a donor (graft) is used to replace your torn ACL. LET [facility] PROVIDER KNOW ABOUT:   Any allergies you have.  Any medicine you are taking, including vitamins, herbs, eye  drops, over-the-counter medicines, and creams.  Problems you have had with numbing medicines (local anesthetics) or medicines that make you sleep (general anesthetics).  A family history of problems with anesthetics.  Any blood disorders you have or history of excessive bleeding you have.  Previous surgeries you have had.  Any history of tobacco use.  Any [place]-term health conditions you have, such as diabetes. RISKS AND COMPLICATIONS Generally, ACL reconstruction is a safe procedure. The following complications are very rare, but they can occur:  Infection.  Failure or repeated rupture of the reconstructed ligament.  Knee stiffness. BEFORE THE PROCEDURE  Your health care provider will instruct you when you need to stop eating and drinking.  Ask your health care provider if you need to change or stop taking any medicines that you regularly take. PROCEDURE  A small incision will be made in your knee. A thin, flexible, tube-like surgical instrument with a camera on the end (arthroscope) will be inserted into your knee joint. Your torn ACL will be removed with the arthroscope. The graft will be inserted into your knee joint with the arthroscope. Guides will be used to place the graft in the proper position. Tunnels in your upper tibia and femur will be created with large drills, which are placed over guidewires. The graft will be pulled into the tunnels on both your femur and your tibia. The graft will be secured in place in the tunnels with screws.  AFTER THE PROCEDURE You will be taken to the recovery area where a nurse will watch and check your progress. You will be allowed to leave with a family member or other adult who will stay with you once your health care provider feels it is safe for you to go home.    This information is not intended to replace advice given to you by your health care provider. Make sure you discuss any questions you have with your health care provider.     Document Released: 06/15/2004 Document Revised: 09/06/2014 Document Reviewed: 01/03/2012 Elsevier Interactive Patient Education [employer] Else[employer] Inc.

## 2015-06-14 NOTE — ED Notes (Signed)
Rt. Knee pain, pt. Was seen on the 18 th of October and was referred to Glee ArvinMichael Xu.    They would not see him due to pt. Did not have a referral.  He needs a referral to see him and he is in severe pain.  Rt. Knee is swollen.

## 2015-06-30 ENCOUNTER — Other Ambulatory Visit: Payer: Self-pay | Admitting: Orthopaedic Surgery

## 2015-06-30 DIAGNOSIS — M25561 Pain in right knee: Secondary | ICD-10-CM

## 2015-08-21 ENCOUNTER — Emergency Department (HOSPITAL_COMMUNITY)
Admission: EM | Admit: 2015-08-21 | Discharge: 2015-08-21 | Disposition: A | Discharge status: Home / Self Care | Payer: Medicaid Other | Attending: Emergency Medicine | Admitting: Emergency Medicine

## 2015-08-21 ENCOUNTER — Emergency Department (HOSPITAL_COMMUNITY): Payer: Medicaid Other

## 2015-08-21 ENCOUNTER — Encounter (HOSPITAL_COMMUNITY): Payer: Self-pay | Admitting: Emergency Medicine

## 2015-08-21 DIAGNOSIS — Y9289 Other specified places as the place of occurrence of the external cause: Secondary | ICD-10-CM | POA: Insufficient documentation

## 2015-08-21 DIAGNOSIS — Z91040 Latex allergy status: Secondary | ICD-10-CM | POA: Insufficient documentation

## 2015-08-21 DIAGNOSIS — Y998 Other external cause status: Secondary | ICD-10-CM | POA: Insufficient documentation

## 2015-08-21 DIAGNOSIS — W228XXA Striking against or struck by other objects, initial encounter: Secondary | ICD-10-CM | POA: Diagnosis not present

## 2015-08-21 DIAGNOSIS — F17210 Nicotine dependence, cigarettes, uncomplicated: Secondary | ICD-10-CM | POA: Insufficient documentation

## 2015-08-21 DIAGNOSIS — S6992XA Unspecified injury of left wrist, hand and finger(s), initial encounter: Secondary | ICD-10-CM

## 2015-08-21 DIAGNOSIS — Y9389 Activity, other specified: Secondary | ICD-10-CM | POA: Insufficient documentation

## 2015-08-21 DIAGNOSIS — Z791 Long term (current) use of non-steroidal anti-inflammatories (NSAID): Secondary | ICD-10-CM | POA: Insufficient documentation

## 2015-08-21 DIAGNOSIS — S60417A Abrasion of left little finger, initial encounter: Secondary | ICD-10-CM | POA: Diagnosis not present

## 2015-08-21 DIAGNOSIS — Z8719 Personal history of other diseases of the digestive system: Secondary | ICD-10-CM | POA: Diagnosis not present

## 2015-08-21 DIAGNOSIS — Z87448 Personal history of other diseases of urinary system: Secondary | ICD-10-CM | POA: Insufficient documentation

## 2015-08-21 MED ORDER — OXYCODONE-ACETAMINOPHEN 5-325 MG PO TABS: 2.0000 | ORAL_TABLET | ORAL | Status: DC | PRN

## 2015-08-21 MED ORDER — HYDROMORPHONE HCL 1 MG/ML IJ SOLN
1.0000 mg | Freq: Once | INTRAMUSCULAR | Status: AC
  Administered 2015-08-21: 1 mg via INTRAMUSCULAR
  Filled 2015-08-21: qty 1

## 2015-08-21 NOTE — Discharge Instructions (Signed)
Follow-up with orthopedic hand surgeon. Take Tylenol or Motrin for pain and Percocet for breakthrough pain. Apply ice to the area and rest the hand. You should be able to flex and extend your fingers (open and closure hand) once your pain resolves. Return if your hand is pale, pulseless, hardened, or you are unable to close the hand after a couple of days.

## 2015-08-21 NOTE — ED Notes (Signed)
See pa note.  
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
## 2015-12-06 ENCOUNTER — Emergency Department (HOSPITAL_COMMUNITY): Payer: Medicaid Other

## 2015-12-06 ENCOUNTER — Emergency Department (HOSPITAL_COMMUNITY)
Admission: EM | Admit: 2015-12-06 | Discharge: 2015-12-06 | Disposition: A | Discharge status: Home / Self Care | Payer: Medicaid Other | Attending: Emergency Medicine | Admitting: Emergency Medicine

## 2015-12-06 ENCOUNTER — Encounter (HOSPITAL_COMMUNITY): Payer: Self-pay | Admitting: Emergency Medicine

## 2015-12-06 DIAGNOSIS — R200 Anesthesia of skin: Secondary | ICD-10-CM | POA: Insufficient documentation

## 2015-12-06 DIAGNOSIS — S80211A Abrasion, right knee, initial encounter: Secondary | ICD-10-CM | POA: Diagnosis not present

## 2015-12-06 DIAGNOSIS — Z8719 Personal history of other diseases of the digestive system: Secondary | ICD-10-CM | POA: Diagnosis not present

## 2015-12-06 DIAGNOSIS — Y9389 Activity, other specified: Secondary | ICD-10-CM | POA: Diagnosis not present

## 2015-12-06 DIAGNOSIS — Z791 Long term (current) use of non-steroidal anti-inflammatories (NSAID): Secondary | ICD-10-CM | POA: Insufficient documentation

## 2015-12-06 DIAGNOSIS — Z91040 Latex allergy status: Secondary | ICD-10-CM | POA: Diagnosis not present

## 2015-12-06 DIAGNOSIS — Y9289 Other specified places as the place of occurrence of the external cause: Secondary | ICD-10-CM | POA: Diagnosis not present

## 2015-12-06 DIAGNOSIS — Y998 Other external cause status: Secondary | ICD-10-CM | POA: Insufficient documentation

## 2015-12-06 DIAGNOSIS — W2111XA Struck by baseball bat, initial encounter: Secondary | ICD-10-CM | POA: Diagnosis not present

## 2015-12-06 DIAGNOSIS — S8991XA Unspecified injury of right lower leg, initial encounter: Secondary | ICD-10-CM

## 2015-12-06 DIAGNOSIS — Z87448 Personal history of other diseases of urinary system: Secondary | ICD-10-CM | POA: Insufficient documentation

## 2015-12-06 DIAGNOSIS — F17210 Nicotine dependence, cigarettes, uncomplicated: Secondary | ICD-10-CM | POA: Diagnosis not present

## 2015-12-06 MED ORDER — KETOROLAC TROMETHAMINE 60 MG/2ML IM SOLN
60.0000 mg | Freq: Once | INTRAMUSCULAR | Status: AC
  Administered 2015-12-06: 60 mg via INTRAMUSCULAR
  Filled 2015-12-06: qty 2

## 2015-12-06 MED ORDER — MELOXICAM 15 MG PO TABS: 15.0000 mg | ORAL_TABLET | Freq: Every day | ORAL | Status: DC
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
## 2015-12-06 NOTE — Discharge Instructions (Signed)
You have been seen today for a knee injury. Your imaging showed no abnormalities. Follow-up with orthopedics should symptoms continue. Rest, elevation, ice, and anti-inflammatory medications such as naproxen, ibuprofen, or the prescribed Mobic are indicated treatment for this type of injury. Follow up with PCP as needed. Return to ED should symptoms worsen.  RESOURCE GUIDE  Chronic Pain Problems: Contact [facility]  [phone] Patients need to be referred by their primary care doctor.  Insufficient Money for Medicine: Contact United Way:  call "211" or [facility] [phone].  No Primary Care Doctor: - Call Health Connect  [phone] - can help you locate a primary care doctor that  accepts your insurance, provides certain services, etc. - Physician Referral Service- [phone]  Agencies that provide inexpensive medical care: - [facility]  [phone] - [facility]  [phone] - [facility]  [phone] - Women's Clinic  [phone] - Planned Parenthood  [phone] Haynes Bast[facility]  [phone]  Medicaid-accepting [facility] Providers: - [facility]- [address], [address]  [phone], Mon-Fri 9am-7pm, Sat 9am-1pm - [facility]- [address], [address] [place]  [phone] - [facility]- [address], [address] [place]  [phone] [facility]- [address]  [phone] - Renaye RakersVeita Bland- [address], [address], [phone]  Only accepts [place] patients after they have their name  applied to their card  Self Pay (no insurance) in [place]: - Sickle Cell Patients: Dr Willey BladeEric Dean, [facility]  [address], [phone] - [facility]- [address]  [phone]       Redge Gainer-     [facility]- [address], [address]       -     [facility]-  see information above (Speak to CitigroupPam H if you do not have insurance)       -  [facility]- [address], [phone]       -  [facility]- [address],  [phone]       -  [facility]- [address], [phone]       -  Dr Julio Sickssei-Bonsu-  [address], [address], [place], [phone]       -  [facility]- [address], [phone]       -  [facility]- [address], [phone], also [address], [phone]       -    [facility]- [address] [place], [phone], 1st & 3rd Saturday   every month, 10am-1pm  1) Find a Doctor and Pay Out of Pocket Although you won't have to find out who is covered by your insurance plan, it is a good idea to ask around and get recommendations. You will then need to call the office and see if the doctor you have chosen will accept you as a new patient and what types of options they offer for patients who are self-pay. Some doctors offer discounts or will set up payment plans for their patients who do not have insurance, but you will need to ask so you aren't surprised when you get to your appointment.  2) Contact Your Local Health Department Not all health departments have doctors that can see  patients for sick visits, but many do, so it is worth a call to see if yours does. If you don't know where your local health department is, you can check in your phone book. The CDC also has a tool to help you locate your state's health department, and many state websites also have listings of all of their local health departments.  3) Find a Walk-in Clinic If your illness is not likely to be very severe or complicated, you may want to try a walk in clinic. These are popping up all over the country in pharmacies, drugstores, and shopping centers. They're usually staffed by nurse practitioners or physician assistants that have been trained to treat common illnesses and complaints. They're usually  fairly quick and inexpensive. However, if you have serious medical issues or chronic medical problems, these are probably not your best option  STD Testing - [facility] of [facility], STD Clinic, [address] [place], phone [phone] or [phone].  Monday - Friday, call for an appointment. [facility] Department of [employer], STD Clinic, [address], [place], phone [phone] or [phone].  Monday - Friday, call for an appointment.  Abuse/Neglect: [facility] Child Abuse Hotline 760-554-9316 [facility] Child Abuse Hotline 7010936092 (After Hours)  Emergency Shelter:  [facility] [phone]  Maternity Homes: - [facility] [place] [facility] 6601056168 - [facility] ([phone]  MRSA Hotline #:   864-198-7774  [facility] Resources  [facility]  United Way [facility] Dept. [address]                 [address]         [address] [place] [address]  Blondell Reveal Phone:  [phone]                                  Phone:  [phone]                   Phone:  3408760752  [facility], [phone] - [place] - [phone]       -     [facility] in [place], [address]                                  [phone], [facility] Child Abuse Hotline (719)245-7807 or 5801859786 (After Hours)   Behavioral Health Services  Substance Abuse Resources: - Alcohol and Drug Services  [phone] - [facility] 3201267670 - [facility] [phone] Floydene Flock [phone] - Residential & Outpatient Substance Abuse Program  [phone]  Psychological Services: [facility]   [phone] Services  [phone] - [facility], [phone] [place]. [address], [place],  ACCESS LINE: 402-738-7241 or [phone], [employer]za  Dental Assistance  If unable to pay or uninsured, contact:  [facility] or [facility] to become qualified for the adult dental clinic.  Patients with Medicaid: [facility] [phone] [address], [phone] [address], [phone]  If unable to pay, or uninsured, contact [facility] [phone]) or [facility] [phone] in [place], [phone] in [facility]) to become qualified for the adult dental clinic   Other Low-Cost Community Dental Services: - [facility]- [address] [place], [place], [place], [address], [phone], [phone], 2nd and 4th Thursday of the month at 6:30am.  10 clients each day by appointment, can sometimes see walk-in patients if someone does not show for an appointment. [facility]- [address] [place], [place], [address], [phone] - [facility]- [address], [place], [place], [address], [phone] - [facility]- [phone] [facility]- [phone] [facility]- [phone]
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
## 2015-12-31 ENCOUNTER — Emergency Department (HOSPITAL_COMMUNITY): Payer: Medicaid Other

## 2015-12-31 ENCOUNTER — Inpatient Hospital Stay (HOSPITAL_COMMUNITY)
Admission: AD | Admit: 2015-12-31 | Discharge: 2016-01-02 | DRG: 897 | Disposition: A | Discharge status: Home / Self Care | Payer: Medicaid Other | Source: Intra-hospital | Attending: Psychiatry | Admitting: Psychiatry

## 2015-12-31 ENCOUNTER — Emergency Department (HOSPITAL_COMMUNITY)
Admission: EM | Admit: 2015-12-31 | Discharge: 2015-12-31 | Disposition: A | Discharge status: Other Acute Inpatient Hospital | Payer: Medicaid Other | Attending: Emergency Medicine | Admitting: Emergency Medicine

## 2015-12-31 ENCOUNTER — Encounter (HOSPITAL_COMMUNITY): Payer: Self-pay

## 2015-12-31 ENCOUNTER — Encounter (HOSPITAL_COMMUNITY): Payer: Self-pay | Admitting: Emergency Medicine

## 2015-12-31 DIAGNOSIS — Z791 Long term (current) use of non-steroidal anti-inflammatories (NSAID): Secondary | ICD-10-CM | POA: Diagnosis not present

## 2015-12-31 DIAGNOSIS — R45851 Suicidal ideations: Secondary | ICD-10-CM | POA: Diagnosis present

## 2015-12-31 DIAGNOSIS — F1510 Other stimulant abuse, uncomplicated: Secondary | ICD-10-CM | POA: Insufficient documentation

## 2015-12-31 DIAGNOSIS — F1914 Other psychoactive substance abuse with psychoactive substance-induced mood disorder: Secondary | ICD-10-CM | POA: Diagnosis present

## 2015-12-31 DIAGNOSIS — F329 Major depressive disorder, single episode, unspecified: Secondary | ICD-10-CM | POA: Insufficient documentation

## 2015-12-31 DIAGNOSIS — F1410 Cocaine abuse, uncomplicated: Secondary | ICD-10-CM | POA: Diagnosis not present

## 2015-12-31 DIAGNOSIS — F1424 Cocaine dependence with cocaine-induced mood disorder: Secondary | ICD-10-CM | POA: Diagnosis present

## 2015-12-31 DIAGNOSIS — Z79891 Long term (current) use of opiate analgesic: Secondary | ICD-10-CM | POA: Insufficient documentation

## 2015-12-31 DIAGNOSIS — F17210 Nicotine dependence, cigarettes, uncomplicated: Secondary | ICD-10-CM | POA: Insufficient documentation

## 2015-12-31 DIAGNOSIS — Z560 Unemployment, unspecified: Secondary | ICD-10-CM | POA: Diagnosis not present

## 2015-12-31 DIAGNOSIS — F32A Depression, unspecified: Secondary | ICD-10-CM | POA: Diagnosis present

## 2015-12-31 DIAGNOSIS — F1524 Other stimulant dependence with stimulant-induced mood disorder: Secondary | ICD-10-CM | POA: Diagnosis not present

## 2015-12-31 DIAGNOSIS — Z91040 Latex allergy status: Secondary | ICD-10-CM | POA: Insufficient documentation

## 2015-12-31 LAB — CBC
HEMATOCRIT: 42.2 % (ref 39.0–52.0)
HEMOGLOBIN: 14.9 g/dL (ref 13.0–17.0)
MCH: 31.3 pg (ref 26.0–34.0)
MCHC: 35.3 g/dL (ref 30.0–36.0)
MCV: 88.7 fL (ref 78.0–100.0)
Platelets: 279 10*3/uL (ref 150–400)
RBC: 4.76 MIL/uL (ref 4.22–5.81)
RDW: 13.7 % (ref 11.5–15.5)
WBC: 12.2 10*3/uL — AB (ref 4.0–10.5)

## 2015-12-31 LAB — COMPREHENSIVE METABOLIC PANEL
ALK PHOS: 65 U/L (ref 38–126)
ALT: 15 U/L — AB (ref 17–63)
AST: 22 U/L (ref 15–41)
Albumin: 4.1 g/dL (ref 3.5–5.0)
Anion gap: 10 (ref 5–15)
BUN: 15 mg/dL (ref 6–20)
CALCIUM: 9.3 mg/dL (ref 8.9–10.3)
CHLORIDE: 107 mmol/L (ref 101–111)
CO2: 24 mmol/L (ref 22–32)
Creatinine, Ser: 0.91 mg/dL (ref 0.61–1.24)
Glucose, Bld: 133 mg/dL — ABNORMAL HIGH (ref 65–99)
Potassium: 3.9 mmol/L (ref 3.5–5.1)
Sodium: 141 mmol/L (ref 135–145)
Total Bilirubin: 1.2 mg/dL (ref 0.3–1.2)
Total Protein: 6.4 g/dL — ABNORMAL LOW (ref 6.5–8.1)

## 2015-12-31 LAB — RAPID URINE DRUG SCREEN, HOSP PERFORMED
AMPHETAMINES: POSITIVE — AB
BARBITURATES: NOT DETECTED
BENZODIAZEPINES: NOT DETECTED
COCAINE: POSITIVE — AB
Opiates: NOT DETECTED
TETRAHYDROCANNABINOL: POSITIVE — AB

## 2015-12-31 LAB — ACETAMINOPHEN LEVEL

## 2015-12-31 LAB — ETHANOL: Alcohol, Ethyl (B): <5 mg/dL (ref <5)

## 2015-12-31 LAB — SALICYLATE LEVEL: Salicylate Lvl: <4 mg/dL (ref 2.8–30.0)

## 2015-12-31 MED ORDER — ACETAMINOPHEN 325 MG PO TABS
650.0000 mg | ORAL_TABLET | Freq: Four times a day (QID) | ORAL | Status: DC | PRN
  Administered 2016-01-01: 650 mg via ORAL
  Filled 2015-12-31: qty 2

## 2015-12-31 MED ORDER — LORAZEPAM 1 MG PO TABS: 0.0000 mg | ORAL_TABLET | Freq: Two times a day (BID) | ORAL | Status: DC

## 2015-12-31 MED ORDER — ENSURE ENLIVE PO LIQD
237.0000 mL | Freq: Two times a day (BID) | ORAL | Status: DC
  Administered 2016-01-01: 237 mL via ORAL

## 2015-12-31 MED ORDER — NICOTINE 21 MG/24HR TD PT24
21.0000 mg | MEDICATED_PATCH | Freq: Every day | TRANSDERMAL | Status: DC
  Administered 2016-01-01: 21 mg via TRANSDERMAL
  Filled 2015-12-31 (×3): qty 1

## 2015-12-31 MED ORDER — MAGNESIUM HYDROXIDE 400 MG/5ML PO SUSP: 30.0000 mL | Freq: Every day | ORAL | Status: DC | PRN

## 2015-12-31 MED ORDER — LORAZEPAM 1 MG PO TABS
0.0000 mg | ORAL_TABLET | Freq: Four times a day (QID) | ORAL | Status: DC
  Administered 2015-12-31 – 2016-01-01 (×2): 1 mg via ORAL
  Filled 2015-12-31 (×2): qty 1

## 2015-12-31 MED ORDER — LORAZEPAM 1 MG PO TABS
0.0000 mg | ORAL_TABLET | Freq: Four times a day (QID) | ORAL | Status: DC
  Administered 2015-12-31: 2 mg via ORAL
  Filled 2015-12-31: qty 2

## 2015-12-31 MED ORDER — ALUM & MAG HYDROXIDE-SIMETH 200-200-20 MG/5ML PO SUSP: 30.0000 mL | ORAL | Status: DC | PRN

## 2015-12-31 NOTE — Tx Team (Signed)
Initial Interdisciplinary Treatment Plan   PATIENT STRESSORS: Marital or family conflict Substance abuse   PATIENT STRENGTHS: Barrister's clerkCommunication skills Motivation for treatment/growth   PROBLEM LIST: Problem List/Patient Goals Date to be addressed Date deferred Reason deferred Estimated date of resolution  Substance abuse 12/31/15     Anxiety 12/31/15     Depression 12/31/15     Suicidal ideation 12/31/15     "Gain weight" 12/31/15     "Get put back on the right medication to feel better and get my family back" 12/31/15                        DISCHARGE CRITERIA:  Improved stabilization in mood, thinking, and/or behavior Verbal commitment to aftercare and medication compliance Withdrawal symptoms are absent or subacute and managed without 24-hour nursing intervention  PRELIMINARY DISCHARGE PLAN: Outpatient therapy Medication management  PATIENT/FAMIILY INVOLVEMENT: This treatment plan has been presented to and reviewed with the patient, Steven Montoya.  The patient and family have been given the opportunity to ask questions and make suggestions.  Norm ParcelHeather V Essynce Munsch 12/31/2015, 9:50 PM

## 2015-12-31 NOTE — ED Notes (Signed)
[facility] [employer]. All belongings given to driver. No s/s of distress noted at discharge.

## 2015-12-31 NOTE — ED Notes (Signed)
Patient here with complaints of severe depression and suicidal ideation. Reports wanting to hurt self with no plan. Also wants detox from "anything you can think of". Patient reports cocaine, meth, THC use. Tearful in triage. States he has never been through anything like this before.

## 2015-12-31 NOTE — ED Notes (Signed)
Pt in room sleeping soundly.  He did not awaken for assessment.  His breathing is even and unlabored.  No distress noted.  15 minute checks and video monitoring continue.

## 2015-12-31 NOTE — ED Provider Notes (Signed)
CSN: [id number]     Arrival date & time 12/31/15  1305 History   First MD Initiated Contact with Patient 12/31/15 1405     Chief Complaint  Patient presents with  . Depression    Detox  . Suicidal     (Consider location/radiation/quality/duration/timing/severity/associated sxs/prior Treatment) HPI Comments: Patient here with polysubstance abuse with suicidal ideations without a definitive plan. States he uses cocaine, methamphetamines, THC. Denies any prior history of suicide attempt. No command hallucinations. Denies any chest discomfort or abdominal discomfort. No severe headaches. States he has been more depressed recently but won't go into details. Is currently unemployed. No treatment use prior to arrival  Patient is a 40 y.o. male presenting with depression. The history is provided by the patient.  Depression    Past Medical History  Diagnosis Date  . Chronic urethral stricture   . Urgency of urination   . Frequency of urination   . Urinary straining   . Nocturia   . Lower abdominal pain   . Headache(784.0)   . Diverticulitis    Past Surgical History  Procedure Laterality Date  . Cysto/ retrograde pyelogram/ urethral dilation  11-05-2010  . Colonoscopy N/A 08/17/2013    Procedure: COLONOSCOPY;  Surgeon: Romie LeveeAlicia Thomas, MD;  Location: [facility];  Service: Endoscopy;  Laterality: N/A;  . Tooth extraction      canines   Family History  Problem Relation Age of Onset  . Heart disease Father    Social History  Substance Use Topics  . Smoking status: Current Every Day Smoker -- 0.50 packs/day for 4 years    Types: Cigarettes  . Smokeless tobacco: Current User    Types: Chew     Comment: not chewed tobacco in 3 months  . Alcohol Use: No     Comment: QUIT 2013    Review of Systems  Psychiatric/Behavioral: Positive for depression.  All other systems reviewed and are negative.     Allergies  Latex and Adhesive  Home Medications   Prior to Admission  medications   Medication Sig Start Date End Date Taking? Authorizing Provider  HYDROcodone-acetaminophen (NORCO/VICODIN) 5-325 MG tablet Take 1 tablet by mouth every 6 (six) hours as needed for severe pain. Patient not taking: Reported on 12/31/2015 06/14/15   Arthor CaptainAbigail Harris, PA-C  ibuprofen (ADVIL,MOTRIN) 800 MG tablet Take 1 tablet (800 mg total) by mouth 3 (three) times daily. Patient not taking: Reported on 12/31/2015 05/18/14   Ladona MowJoe Mintz, PA-C  meloxicam (MOBIC) 15 MG tablet Take 1 tablet (15 mg total) by mouth daily. Patient not taking: Reported on 12/31/2015 12/06/15   Shawn C Joy, PA-C  naproxen (NAPROSYN) 375 MG tablet Take 1 tablet (375 mg total) by mouth 2 (two) times daily. Patient not taking: Reported on 12/31/2015 06/14/15   Arthor CaptainAbigail Harris, PA-C  oxyCODONE-acetaminophen (PERCOCET/ROXICET) 5-325 MG tablet Take 2 tablets by mouth every 4 (four) hours as needed for severe pain. Patient not taking: Reported on 12/31/2015 08/21/15   Hanna Patel-Mills, PA-C   BP 155/86 mmHg  Pulse 100  Temp(Src) 97.9 F (36.6 C) (Oral)  Resp 18  Wt 64.003 kg  SpO2 99% Physical Exam  Constitutional: He is oriented to person, place, and time. He appears well-developed and well-nourished.  Non-toxic appearance. No distress.  HENT:  Head: Normocephalic and atraumatic.  Eyes: Conjunctivae, EOM and lids are normal. Pupils are equal, round, and reactive to light.  Neck: Normal range of motion. Neck supple. No tracheal deviation present. No thyroid mass present.  Cardiovascular: Normal rate, regular rhythm and normal heart sounds.  Exam reveals no gallop.   No murmur heard. Pulmonary/Chest: Effort normal and breath sounds normal. No stridor. No respiratory distress. He has no decreased breath sounds. He has no wheezes. He has no rhonchi. He has no rales.  Abdominal: Soft. Normal appearance and bowel sounds are normal. He exhibits no distension. There is no tenderness. There is no rebound and no CVA tenderness.   Musculoskeletal: Normal range of motion. He exhibits no edema or tenderness.  Neurological: He is alert and oriented to person, place, and time. He has normal strength. No cranial nerve deficit or sensory deficit. GCS eye subscore is 4. GCS verbal subscore is 5. GCS motor subscore is 6.  Skin: Skin is warm and dry. No abrasion and no rash noted.  Psychiatric: He has a normal mood and affect. His speech is normal and behavior is normal. He expresses suicidal ideation. He expresses no suicidal plans.  Nursing note and vitals reviewed.   ED Course  Procedures (including critical care time) Labs Review Labs Reviewed  COMPREHENSIVE METABOLIC PANEL - Abnormal; Notable for the following:    Glucose, Bld 133 (*)    Total Protein 6.4 (*)    ALT 15 (*)    All other components within normal limits  ACETAMINOPHEN LEVEL - Abnormal; Notable for the following:    Acetaminophen (Tylenol), Serum <10 (*)    All other components within normal limits  CBC - Abnormal; Notable for the following:    WBC 12.2 (*)    All other components within normal limits  URINE RAPID DRUG SCREEN, HOSP PERFORMED - Abnormal; Notable for the following:    Cocaine POSITIVE (*)    Amphetamines POSITIVE (*)    Tetrahydrocannabinol POSITIVE (*)    All other components within normal limits  ETHANOL  SALICYLATE LEVEL    Imaging Review No results found. I have personally reviewed and evaluated these images and lab results as part of my medical decision-making.   EKG Interpretation None      MDM   Final diagnoses:  None    Patient has a medically clear for disposition by psychiatry    Lorre Nick, MD 12/31/15 1444

## 2015-12-31 NOTE — BH Assessment (Addendum)
Assessment Note  Steven Montoya is an 40 y.o. male. [facility]. Patient with polysubstance abuse. Patient uses cocaine, THC, methamphetamine, and drinks alcohol. Patient started using drugs at the age of 349-40 yrs old. He reports daily use of all substances. He has used all substance for 3 yrs consistently with the exception of alcohol she has used for the past 25 yrs. Patient's last used all substances "last night". He reports several withdrawal symptoms including fatigue, irritability, hot/cold flashes, diarrhea this am (not currently), nausea. He also reports increased anxiety over the past several months associated with panic attacks. Patient has not history of of substance abuse treatment. No history of seizures.   Patient denies current SI, HI, and AVH's. He no history of SI, HI, and AVH's. Writer referred back the the ED notes in which patient indicated that he had suicidal ideations without a plan. Patient explained that when he woke up this morning and he did have passive and fleeting thoughts of suicide. Patient sts, "I would never try to hurt myself and I don't want to hurt myself I just want detox". Patient contracts for safety.  Patient does report depressive symptoms over the past 2-3 days such as hopelessness, fatigue, and crying spells. Patient has decreased appetite due to his drug use and depression. He also reports poor sleep over the past 4 days. Patient also has increased anxiety and panic attacks. Patient denies a family history of mental health illness.   Patient denies HI and AVH's. No outpatient mental health providers. No history of INPT mental health treatment or substance abuse treatment.   Diagnosis: Polysubstance Abuse, Anxiety Disorder,  and Major Depressive Disorder, Mild  Past Medical History:  Past Medical History  Diagnosis Date  . Chronic urethral stricture   . Urgency of urination   . Frequency of urination   . Urinary straining    . Nocturia   . Lower abdominal pain   . Headache(784.0)   . Diverticulitis     Past Surgical History  Procedure Laterality Date  . Cysto/ retrograde pyelogram/ urethral dilation  11-05-2010  . Colonoscopy N/A 08/17/2013    Procedure: COLONOSCOPY;  Surgeon: Romie LeveeAlicia Thomas, MD;  Location: [facility];  Service: Endoscopy;  Laterality: N/A;  . Tooth extraction      canines    Family History:  Family History  Problem Relation Age of Onset  . Heart disease Father     Social History:  reports that he has been smoking Cigarettes.  He has a 2 pack-year smoking history. His smokeless tobacco use includes Chew. He reports that he uses illicit drugs (Marijuana). He reports that he does not drink alcohol.  Additional Social History:  Alcohol / Drug Use Pain Medications: SEE MAR Prescriptions: SEE MAR Over the Counter: SEE MAR History of alcohol / drug use?: Yes Longest period of sobriety (when/how [place]): n/a Negative Consequences of Use: Legal (Patient has current charges for burgurlary; has a court date 01/05/2016) Withdrawal Symptoms: Agitation, Weakness, Irritability, Fever / Chills, Tingling, Sweats, Diarrhea Substance #1 Name of Substance 1: Cocaine  1 - Age of First Use: 40 yrs old  1 - Amount (size/oz): quarter of an ounce 1 - Frequency: daily  1 - Duration: 3 yrs  1 - Last Use / Amount: 12/30/2015 Substance #2 Name of Substance 2: THC 2 - Age of First Use: 40 yrs old  2 - Amount (size/oz): 1/2 of a quarter 2 - Frequency: daily  2 - Duration:  3 yrs  2 - Last Use / Amount: 12/30/2015 Substance #3 Name of Substance 3: Methamphetamine 3 - Age of First Use: 40 yrs old  3 - Amount (size/oz): 1 gram 3 - Frequency: daily  3 - Duration: 3 years 3 - Last Use / Amount: 12/30/2015 Substance #5 Name of Substance 5: Alcohol  5 - Age of First Use: 40 yrs old  5 - Amount (size/oz): 1 case 5 - Frequency: daily  5 - Duration: 25 years 5 - Last Use / Amount: 12/30/2015  CIWA: CIWA-Ar BP:  155/86 mmHg Pulse Rate: 100 Nausea and Vomiting: mild nausea with no vomiting Tactile Disturbances: mild itching, pins and needles, burning or numbness Tremor: two Auditory Disturbances: very mild harshness or ability to frighten Paroxysmal Sweats: two Visual Disturbances: very mild sensitivity Anxiety: mildly anxious Headache, Fullness in Head: very mild Agitation: somewhat more than normal activity Orientation and Clouding of Sensorium: oriented and can do serial additions CIWA-Ar Total: 12 COWS:    Allergies:  Allergies  Allergen Reactions  . Latex Other (See Comments)    HX SUPERFICIAL THROMBOPHLEBITIS LEFT FOREARM AFTER IV IN APR 2011  . Adhesive [Tape] Rash    Home Medications:  (Not in a hospital admission)  OB/GYN Status:  No LMP for male patient.  General Assessment Data Location of Assessment: [facility] TTS Assessment: In system Is this a Tele or Face-to-Face Assessment?: Face-to-Face Is this an Initial Assessment or a Re-assessment for this encounter?: Initial Assessment Marital status: Single Maiden name:  (n/a) Is patient pregnant?: No Pregnancy Status: No Living Arrangements: Other (Comment) (patient lives with spouse) Can pt return to current living arrangement?: No Admission Status: Voluntary Is patient capable of signing voluntary admission?: Yes Referral Source: Self/Family/Friend Insurance type:  (Medicaid)     Crisis Care Plan Living Arrangements: Other (Comment) (patient lives with spouse) Legal Guardian:  (no legal guardian ) Name of Psychiatrist:  (no psychiatrist ) Name of Therapist:  (no therapist )  Education Status Is patient currently in school?: No Current Grade:  (n/a) Highest grade of school patient has completed:  (n/a) Name of school:  (n/a) Contact person:  (n/a)  Risk to self with the past 6 months Suicidal Ideation: No-Not Currently/Within Last 6 Months Has patient been a risk to self within the past 6 months prior to  admission? : No Suicidal Intent: No Has patient had any suicidal intent within the past 6 months prior to admission? : No Is patient at risk for suicide?: No Suicidal Plan?: No Has patient had any suicidal plan within the past 6 months prior to admission? : No Access to Means: No What has been your use of drugs/alcohol within the last 12 months?:  (polysubstance abuse) Previous Attempts/Gestures: No How many times?:  (n/a) Other Self Harm Risks:  (denies ) Triggers for Past Attempts: Other (Comment) (no previous attempts or gestures) Intentional Self Injurious Behavior: None Family Suicide History: No Recent stressful life event(s): Other (Comment) (drug use) Persecutory voices/beliefs?: No Depression: Yes Depression Symptoms: Feeling angry/irritable, Loss of interest in usual pleasures, Feeling worthless/self pity, Guilt, Isolating, Fatigue, Tearfulness, Insomnia, Despondent Substance abuse history and/or treatment for substance abuse?: No Suicide prevention information given to non-admitted patients: Not applicable  Risk to Others within the past 6 months Homicidal Ideation: No Does patient have any lifetime risk of violence toward others beyond the six months prior to admission? : No Thoughts of Harm to Others: No Current Homicidal Intent: No Current Homicidal Plan: No Access to  Homicidal Means: No Identified Victim:  (n/a) History of harm to others?: No Assessment of Violence: None Noted Violent Behavior Description:  (patient is calm and cooperative) Does patient have access to weapons?: No Criminal Charges Pending?: Yes Describe Pending Criminal Charges:  (burglary) Does patient have a court date: Yes Court Date:  (01/05/2016) Is patient on probation?: No  Psychosis Hallucinations: None noted Delusions: None noted  Mental Status Report Appearance/Hygiene: In scrubs Eye Contact: Good Motor Activity: Freedom of movement Speech: Logical/coherent Level of  Consciousness: Alert Mood: Depressed Affect: Appropriate to circumstance Anxiety Level: Panic Attacks Panic attack frequency:  (daily ) Most recent panic attack:  (today ) Thought Processes: Coherent, Relevant Judgement: Partial Orientation: Place, Time, Situation Obsessive Compulsive Thoughts/Behaviors: None  Cognitive Functioning Concentration: Decreased Memory: Recent Intact, Remote Intact IQ: Average Insight: Fair Impulse Control: Poor Appetite: Poor Weight Loss:  (patient sts he has loss alot of wt. but not sure exact amt) Weight Gain:  (no wt. gain ) Sleep: Decreased Total Hours of Sleep:  ("I haven't slept well in several days") Vegetative Symptoms: None  ADLScreening [facility] Assessment Services) Patient's cognitive ability adequate to safely complete daily activities?: Yes Patient able to express need for assistance with ADLs?: Yes Independently performs ADLs?: No  Prior Inpatient Therapy Prior Inpatient Therapy: No Prior Therapy Dates:  (n/a) Prior Therapy Facilty/Provider(s):  (n/a) Reason for Treatment:  (n/a)  Prior Outpatient Therapy Prior Outpatient Therapy: No Prior Therapy Dates:  (n/a) Prior Therapy Facilty/Provider(s):  (n/a) Reason for Treatment:  (n/a) Does patient have an ACCT team?: No Does patient have Intensive In-House Services?  : No Does patient have [facility] services? : No Does patient have P4CC services?: No  ADL Screening (condition at time of admission) Patient's cognitive ability adequate to safely complete daily activities?: Yes Is the patient deaf or have difficulty hearing?: No Does the patient have difficulty seeing, even when wearing glasses/contacts?: No Does the patient have difficulty concentrating, remembering, or making decisions?: No Patient able to express need for assistance with ADLs?: Yes Does the patient have difficulty dressing or bathing?: No Independently performs ADLs?: No Communication: Independent Dressing (OT):  Independent Grooming: Independent Feeding: Independent Bathing: Independent Toileting: Independent In/Out Bed: Independent Walks in Home: Independent Does the patient have difficulty walking or climbing stairs?: No Weakness of Legs: None Weakness of Arms/Hands: None  Home Assistive Devices/Equipment Home Assistive Devices/Equipment: None    Abuse/Neglect Assessment (Assessment to be complete while patient is alone) Physical Abuse: Denies Verbal Abuse: Denies Sexual Abuse: Denies Exploitation of patient/patient's resources: Denies Self-Neglect: Denies Values / Beliefs Cultural Requests During Hospitalization: None Spiritual Requests During Hospitalization: None   Advance Directives (For Healthcare) Does patient have an advance directive?: No Would patient like information on creating an advanced directive?: No - patient declined information Nutrition Screen- [facility] Patient's home diet: Regular  Additional Information 1:1 In Past 12 Months?: No CIRT Risk: No Elopement Risk: No Does patient have medical clearance?: Yes     Disposition:  Disposition Initial Assessment Completed for this Encounter: Yes Disposition of Patient: Inpatient treatment program Julieanne Cotton, NP recommends INPT treatment) Type of inpatient treatment program: Adult  On Site Evaluation by:   Reviewed with Physician:    Melynda Ripple [facility] 12/31/2015 3:29 PM

## 2015-12-31 NOTE — Progress Notes (Signed)
Moise BoringDonnie is a 40 year old male being admitted voluntarily to [facility] from [facility].  He came in requesting detox.  He has history of polysubstance abuse.  He uses cocaine, THC, methamphetamine and drinks alcohol.  He has history of withdrawal symptoms of fatigue, irritability, hot/cold flashes, diarrhea and nausea.  He admits to having ADHD/ADD and a chemical imbalance in which he is on disability.  He admits to suicidal ideations at times.  He stated that he was going to buy some heroin and OD today but came to the hospital instead.  He denies HI or A/V hallucinations.  He was very tearful during the assessment.  He denies any medical issues at this time.  Admission paperwork completed and signed.  Belongings searched and secured in locker # 33 (red/black bracelet, blue watch, lime green/black sneakers, 4 plastic bracelets, white/black bracelets).  Skin assessment completed and noted multiple tattoos, poison ivy on left forearm, dry skin patches on face.  He reports that he tripped over his dog last night and landed on his elbow.  He had this evaluated in the ED and nothing was broken.  Q 15 minute checks initiated for safety.  We will monitor the progress towards his goals.

## 2015-12-31 NOTE — BH Assessment (Signed)
[facility] Assessment Progress Note  Per Dahlia ByesJosephine Onuoha, NP, this pt requires psychiatric hospitalization at this time.  Lillia AbedLindsay, RN, [facility]; pt is to be transferred at 19:30.  Pt has signed Voluntary Admission and Consent for Treatment, as well as Consent to Release Information to two male family members, both of whom he identifies as his mother; signed [facility].  Pt's nurse has been notified, and agrees to send original paperwork along with pt via [place], and to call report to [phone].  Doylene Canninghomas Milad Bublitz, MA Triage Specialist [phone]

## 2016-01-01 ENCOUNTER — Encounter (HOSPITAL_COMMUNITY): Payer: Self-pay | Admitting: Psychiatry

## 2016-01-01 DIAGNOSIS — F329 Major depressive disorder, single episode, unspecified: Secondary | ICD-10-CM

## 2016-01-01 DIAGNOSIS — F1524 Other stimulant dependence with stimulant-induced mood disorder: Secondary | ICD-10-CM | POA: Diagnosis present

## 2016-01-01 MED ORDER — THIAMINE HCL 100 MG/ML IJ SOLN
100.0000 mg | Freq: Once | INTRAMUSCULAR | Status: DC
  Filled 2016-01-01: qty 2

## 2016-01-01 MED ORDER — ADULT MULTIVITAMIN W/MINERALS CH
1.0000 | ORAL_TABLET | Freq: Every day | ORAL | Status: DC
  Administered 2016-01-01: 1 via ORAL
  Filled 2016-01-01 (×3): qty 1

## 2016-01-01 MED ORDER — LOPERAMIDE HCL 2 MG PO CAPS: 2.0000 mg | ORAL_CAPSULE | ORAL | Status: DC | PRN

## 2016-01-01 MED ORDER — LORAZEPAM 1 MG PO TABS: 1.0000 mg | ORAL_TABLET | Freq: Every day | ORAL | Status: DC

## 2016-01-01 MED ORDER — LORAZEPAM 1 MG PO TABS: 1.0000 mg | ORAL_TABLET | Freq: Four times a day (QID) | ORAL | Status: DC | PRN

## 2016-01-01 MED ORDER — LORAZEPAM 1 MG PO TABS
1.0000 mg | ORAL_TABLET | Freq: Three times a day (TID) | ORAL | Status: DC
  Administered 2016-01-02: 1 mg via ORAL
  Filled 2016-01-01: qty 1

## 2016-01-01 MED ORDER — NICOTINE POLACRILEX 2 MG MT GUM
2.0000 mg | CHEWING_GUM | OROMUCOSAL | Status: DC | PRN
  Administered 2016-01-01 (×2): 2 mg via ORAL
  Filled 2016-01-01: qty 1

## 2016-01-01 MED ORDER — HYDROXYZINE HCL 25 MG PO TABS: 25.0000 mg | ORAL_TABLET | Freq: Four times a day (QID) | ORAL | Status: DC | PRN

## 2016-01-01 MED ORDER — LORAZEPAM 1 MG PO TABS
1.0000 mg | ORAL_TABLET | Freq: Four times a day (QID) | ORAL | Status: AC
  Administered 2016-01-01 (×3): 1 mg via ORAL
  Filled 2016-01-01 (×4): qty 1

## 2016-01-01 MED ORDER — LORAZEPAM 1 MG PO TABS: 1.0000 mg | ORAL_TABLET | Freq: Two times a day (BID) | ORAL | Status: DC

## 2016-01-01 MED ORDER — ONDANSETRON 4 MG PO TBDP: 4.0000 mg | ORAL_TABLET | Freq: Four times a day (QID) | ORAL | Status: DC | PRN

## 2016-01-01 MED ORDER — VITAMIN B-1 100 MG PO TABS
100.0000 mg | ORAL_TABLET | Freq: Every day | ORAL | Status: DC
  Filled 2016-01-01 (×2): qty 1

## 2016-01-01 NOTE — [facility] Suicide Risk Assessment (Signed)
[facility] INPATIENT:  Family/Significant Other Suicide Prevention Education  Suicide Prevention Education:  Education Completed Theda Belfastngela Hudsey (pt's mother in law) [phone] has been identified by the patient as the family member/significant other with whom the patient will be residing, and identified as the person(s) who will aid the patient in the event of a mental health crisis (suicidal ideations/suicide attempt).  With written consent from the patient, the family member/significant other has been provided the following suicide prevention education, prior to the and/or following the discharge of the patient.  The suicide prevention education provided includes the following:  Suicide risk factors  Suicide prevention and interventions  National Suicide Hotline telephone number  [facility] assessment telephone number  [facility] Emergency Assistance 911  [facility] and/or Residential Mobile Crisis Unit telephone number  Request made of family/significant other to:  Remove weapons (e.g., guns, rifles, knives), all items previously/currently identified as safety concern.    Remove drugs/medications (over-the-counter, prescriptions, illicit drugs), all items previously/currently identified as a safety concern.  The family member/significant other verbalizes understanding of the suicide prevention education information provided.  The family member/significant other agrees to remove the items of safety concern listed above.  Smart, Kaislyn Gulas LCSW 01/01/2016, 10:36 AM

## 2016-01-01 NOTE — [facility] Suicide Risk Assessment (Signed)
[facility]   Nursing information obtained from:  Patient Demographic factors:  Male, Caucasian, Unemployed Current Mental Status:  Suicidal ideation indicated by patient Loss Factors:  Legal issues Historical Factors:  Family history of mental illness or substance abuse, Impulsivity Risk Reduction Factors:  Living with another person, especially a relative  Total Time spent with patient: 45 minutes Principal Problem: <principal problem not specified> Diagnosis:   Patient Active Problem List   Diagnosis Date Noted  . 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 Depressive disorder [F32.9] 12/31/2015  . Rectal bleeding [K62.5] 07/25/2013   Subjective Data: see admission H and P  Continued Clinical Symptoms:  Alcohol Use Disorder Identification Test Final Score (AUDIT): 35 The "Alcohol Use Disorders Identification Test", Guidelines for Use in Primary Care, Second Edition.  World Science writerHealth Organization [facility](WHO). Score between 0-7:  no or low risk or alcohol related problems. Score between 8-15:  moderate risk of alcohol related problems. Score between 16-19:  high risk of alcohol related problems. Score 20 or above:  warrants further diagnostic evaluation for alcohol dependence and treatment.   CLINICAL FACTORS:   Depression:   Comorbid alcohol abuse/dependence Alcohol/Substance Abuse/Dependencies   Psychiatric Specialty Exam: ROS  Blood pressure 123/77, pulse 83, temperature 97.7 F (36.5 C), temperature source Oral, resp. rate 18, height 6' (1.829 m), weight 63.05 kg (139 lb), SpO2 97 %.Body mass index is 18.85 kg/(m^2).   COGNITIVE FEATURES THAT CONTRIBUTE TO RISK:  Closed-mindedness, Polarized thinking and Thought constriction (tunnel vision)    SUICIDE RISK:   Moderate:  Frequent suicidal ideation with limited intensity, and duration, some specificity in terms of plans, no associated intent, good  self-control, limited dysphoria/symptomatology, some risk factors present, and identifiable protective factors, including available and accessible social support.  PLAN OF CARE: See admission H and P   I certify that inpatient services furnished can reasonably be expected to improve the patient's condition.   Rachael FeeLUGO,Legend Tumminello A, MD 01/01/2016, 5:00 PM

## 2016-01-01 NOTE — [facility] Group Notes (Signed)
[facility] LCSW Group Therapy  01/01/2016 3:38 PM  Type of Therapy:  Group Therapy  Participation Level:  Active  Participation Quality:  Attentive  Affect:  Appropriate  Cognitive:  Confused  Insight:  Limited  Engagement in Therapy:  Limited  Modes of Intervention:  Confrontation, Discussion, Education, Exploration, Problem-solving, Rapport Building, Socialization and Support  Summary of Progress/Problems: Today's Topic: Overcoming Obstacles. Patients identified one short term goal and potential obstacles in reaching this goal. Patients processed barriers involved in overcoming these obstacles. Patients identified steps necessary for overcoming these obstacles and explored motivation (internal and external) for facing these difficulties head on. Cylus was attentive during group and shared that his goal was to get his family back together. "My wife is going to [facility]. We want to go together." CSW commended pt on his decision to pursue rehab but notified him of rule about spouses attending the same rehab at the same time. Pt was willing to explore other options and reports that his primary goal is to regain custody of his 9yo son.   Smart, Gwendelyn Lanting LCSW 01/01/2016, 3:38 PM

## 2016-01-01 NOTE — [facility] Group Notes (Signed)
[facility] Group Notes:  (Nursing/MHT/Case Management/Adjunct)  Date:  01/01/2016  Time:  10:45 AM  Type of Therapy:  Psychoeducational Skills  Participation Level:  Did Not Attend  Participation Quality:  N/A  Affect:  N/A  Cognitive:  N/A  Insight:  None  Engagement in Group:  None  Modes of Intervention:  Discussion and Education  Summary of Progress/Problems: Patient was invited to group but chose not to attend.   Steven Montoya E 

## 2016-01-01 NOTE — [facility] Counselor (Addendum)
Adult Comprehensive Assessment  Patient ID: Steven Montoya, male   DOB: 1976-08-26, 40 y.o.   MRN: [id number]  Information Source: Information source: Patient  Current Stressors:  Educational / Learning stressors: quit school after 9th grade Employment / Job issues: "I've never worked." Pt is on disability Family Relationships: close to wife, son, and parents Surveyor, quantityinancial / Lack of resources (include bankruptcy): disability, [employer] Housing / Lack of housing: lives with wife and 309 yo son. Physical health (include injuries & life threatening diseases): pain-"feel like I cracked my elbow." Social relationships: poor-"my friends use drugs. I've been hanging around the wrong crowed for too [place]."  Substance abuse: methamphetamines, alcohol, and crack cocaine.  Bereavement / Loss: "my brother overdosed on heroin in October 2016"   Living/Environment/Situation:  Living Arrangements: Children, Spouse/significant other Living conditions (as described by patient or guardian): living with wife and 649 yo son How [place] has patient lived in current situation?: 10 years What is atmosphere in current home: Comfortable, Loving  Family History:  Marital status: Married Number of Years Married: 10 What types of issues is patient dealing with in the relationship?: "Our relationship is great." "I've just been around the wrong crowd and got addicted to drugs." "My wife uses too and was trying to hurt herself. [facility] took her away and told us both to get help."  Additional relationship information: n/a  Are you sexually active?: Yes What is your sexual orientation?: heterosexual  Has your sexual activity been affected by drugs, alcohol, medication, or emotional stress?: no Does patient have children?: Yes How many children?: 1 How is patient's relationship with their children?: 40 year old son. "We are very close." "[facility] took my son so we could get help."   Childhood History:  By whom was/is the patient  raised?: Both parents Additional childhood history information: fairly good childhood. no s/a or mental illness issues with parents to his knowledge. "I went to a hospital when I was like 40 yo because of suicidal thoughts."  Description of patient's relationship with caregiver when they were a child: close to parents Patient's description of current relationship with people who raised him/her: close to parents How were you disciplined when you got in trouble as a child/adolescent?: n/a Does patient have siblings?: Yes Number of Siblings: 1 Description of patient's current relationship with siblings: 40 year old brother died over heroin overdose last year.  Did patient suffer any verbal/emotional/physical/sexual abuse as a child?: No Did patient suffer from severe childhood neglect?: No Has patient ever been sexually abused/assaulted/raped as an adolescent or adult?: No Was the patient ever a victim of a crime or a disaster?: No Witnessed domestic violence?: No Has patient been effected by domestic violence as an adult?: No  Education:  Highest grade of school patient has completed: 9th grade. "I don't know what happend after that. 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 Name of school: n/a Learning disability?: Yes What learning problems does patient have?: ADHD possibly  Employment/Work Situation:   Employment situation: On disability Why is patient on disability: "chemical imbalance and ADHD." How [place] has patient been on disability: several years Patient's job has been impacted by current illness: No (n/a) What is the longest time patient has a held a job?: "i've never had a job."  Where was the patient employed at that time?: n/a Has patient ever been in the [employer] Lilly and Companymilitary?: No Has patient ever served in combat?: No Did You Receive Any Psychiatric Treatment/Services While  in the Military?: No Are There Guns or Other Weapons in Your Home?: No Are These Weapons  Safely Secured?:  (n/a)  Financial Resources:   Financial resources: [employer], [employer], Food stamps Does patient have a Lawyer or guardian?: No  Alcohol/Substance Abuse:   What has been your use of drugs/alcohol within the last 12 months?: methamphetamines, crack, and alcohol-"all day everyday for the past 3 years." up to one case of beer dailiy.  If attempted suicide, did drugs/alcohol play a role in this?: No Alcohol/Substance Abuse Treatment Hx: Denies past history Has alcohol/substance abuse ever caused legal problems?: court on Monday 5/8 for burglary  Social Support System:   Patient's Community Support System: Poor Describe Community Support System: mainly family support-"I have friends that all use drugs."  Type of faith/religion: n/a  How does patient's faith help to cope with current illness?: n/a   Leisure/Recreation:   Leisure and Hobbies: "I've been doing drugs morning noon and night for the past three years."   Strengths/Needs:   What things does the patient do well?: "nothing right now." In what areas does patient struggle / problems for patient: motivated to "get off drugs and alcohol."   Discharge Plan:   Does patient have access to transportation?: Yes Will patient be returning to same living situation after discharge?: Yes Currently receiving community mental health services: No If no, would patient like referral for services when discharged?: Yes (What county?) [place] county) Does patient have financial barriers related to discharge medications?: No ([place] Medicaid/pt is on disability)  Summary/Recommendations:   Summary and Recommendations (to be completed by the evaluator): Patient is 40 year old male living in [place], [place] with his wife and son. Patient presents to the hospital seeking treatment for cocaine, methamphetamine, THC, and alcohol abuse. Patient denies SI/HI/AVH. Patient reports that he is on disability/does  not work. Recommendations for patient include: crisis stabilization, therapeutic milieu, encourage group attendance and participation, medication management/withdrawals, and development of comprehensive mental wellness/sobriety plan. Patient reports no current providers.   Smart, Kollins Fenter LCSW 01/01/2016 10:16 AM

## 2016-01-01 NOTE — H&P (Signed)
Psychiatric Admission Assessment Adult  Patient Identification: Steven Montoya MRN:  [id number] Date of Evaluation:  01/01/2016 Chief Complaint:  POLYSUBSTANCE ABUSE  ANXIETY DISORDER MDD - MILD Principal Diagnosis: <principal problem not specified> Diagnosis:   Patient Active Problem List   Diagnosis Date Noted  . Depressive disorder [F32.9] 12/31/2015  . Rectal bleeding [K62.5] 07/25/2013   History of Present Illness:: 40 Y/O who states got in with the wrong crowd got on drugs real bad meth cocaine crack 3-4 years. All day every day. On disability "chemical imbalance" ADHD States that "[facility] took his wife and his kid" his wife is also abusing drugs. His brother died of an OD in 07-12-2023. It has gotten worst since then.   Steven Montoya is an 40 y.o. male. He presents to [facility] requesting detox treatment. Patient with polysubstance abuse. Patient uses cocaine, THC, methamphetamine, and drinks alcohol. Patient started using drugs at the age of 56-15 yrs old. He reports daily use of all substances. He has used all substance for 3 yrs consistently with the exception of alcohol she has used for the past 25 yrs. Patient's last used all substances "last night". He reports several withdrawal symptoms including fatigue, irritability, hot/cold flashes, diarrhea this am (not currently), nausea. He also reports increased anxiety over the past several months associated with panic attacks. Patient has not history of of substance abuse treatment. No history of seizures.  Patient denies HI and AVH's. No outpatient mental health providers. No history of INPT mental health treatment or substance abuse treatment.     Past Medical  Associated Signs/Symptoms: Depression Symptoms:  depressed mood, anhedonia, hypersomnia, fatigue, difficulty concentrating, impaired memory, anxiety, panic attacks, loss of energy/fatigue, disturbed sleep, weight loss, decreased appetite, (Hypo) Manic Symptoms:   Impulsivity, Irritable Mood, Labiality of Mood, Anxiety Symptoms:  Excessive Worry, Panic Symptoms, Psychotic Symptoms:  denies PTSD Symptoms: Had a traumatic exposure in the last month:  lost bro to heroin July 12, 2023 his use increased Total Time spent with patient: 45 minutes  Past Psychiatric History:   Is the patient at risk to self? Yes.    Has the patient been a risk to self in the past 6 months? No.  Has the patient been a risk to self within the distant past? No.  Is the patient a risk to others? No.  Has the patient been a risk to others in the past 6 months? No.  Has the patient been a risk to others within the distant past? No.   Prior Inpatient Therapy:  Denies Prior Outpatient Therapy:  40 Y/O suicidal voices thinks the meds that were given to him made him hear voices as when he went off the meds the voices went away  Alcohol Screening: 1. How often do you have a drink containing alcohol?: 4 or more times a week 2. How many drinks containing alcohol do you have on a typical day when you are drinking?: 10 or more 3. How often do you have six or more drinks on one occasion?: Daily or almost daily Preliminary Score: 8 4. How often during the last year have you found that you were not able to stop drinking once you had started?: Daily or almost daily 5. How often during the last year have you failed to do what was normally expected from you becasue of drinking?: Daily or almost daily 6. How often during the last year have you needed a first drink in the morning to get yourself going after a heavy drinking  session?: Daily or almost daily 7. How often during the last year have you had a feeling of guilt of remorse after drinking?: Daily or almost daily 8. How often during the last year have you been unable to remember what happened the night before because you had been drinking?: Weekly 9. Have you or someone else been injured as a result of your drinking?: No 10. Has a relative or  friend or a doctor or another health worker been concerned about your drinking or suggested you cut down?: Yes, during the last year Alcohol Use Disorder Identification Test Final Score (AUDIT): 35 Brief Intervention: Yes Substance Abuse History in the last 12 months:  Yes.   Consequences of Substance Abuse: Blackouts:  yes Withdrawal Symptoms:   Cramps Diaphoresis Diarrhea Headaches Nausea Tremors Vomiting Previous Psychotropic Medications: Yes  Psychological Evaluations: No  Past Medical History:  Past Medical History  Diagnosis Date  . Chronic urethral stricture   . Urgency of urination   . Frequency of urination   . Urinary straining   . Nocturia   . Lower abdominal pain   . Headache(784.0)   . Diverticulitis     Past Surgical History  Procedure Laterality Date  . Cysto/ retrograde pyelogram/ urethral dilation  11-05-2010  . Colonoscopy N/A 08/17/2013    Procedure: COLONOSCOPY;  Surgeon: Leighton Ruff, MD;  Location: [facility];  Service: Endoscopy;  Laterality: N/A;  . Tooth extraction      canines   Family History:  Family History  Problem Relation Age of Onset  . Heart disease Father    Family Psychiatric  History: Denies family history of mental drug abuse Tobacco Screening: '@FLOW' ([phone]::1)@ Social History:  History  Alcohol Use  . 100.8 oz/week  . 168 Cans of beer per week    Comment: Using for 25 years     History  Drug Use  . Yes  . Special: Marijuana, "Crack" cocaine, Methamphetamines    Comment: OCCASIONAL-before going to bed and for pain   Married 10 years one 40 Y/O 9th grade did not go to school did not have a job Additional Social History:      Pain Medications: SEE MAR Prescriptions: SEE MAR Over the Counter: SEE MAR History of alcohol / drug use?: Yes Longest period of sobriety (when/how [place]): n/a Negative Consequences of Use: Legal Withdrawal Symptoms: Agitation, Weakness, Irritability, Fever / Chills, Tingling, Sweats,  Diarrhea Name of Substance 1: Cocaine  1 - Age of First Use: 40 yrs old  1 - Amount (size/oz): quarter of an ounce 1 - Duration: 3 yrs  1 - Last Use / Amount: 12/30/2015 Name of Substance 2: THC 2 - Age of First Use: 41 yrs old  2 - Amount (size/oz): 1/2 of a quarter 2 - Frequency: daily  2 - Duration: 3 yrs  2 - Last Use / Amount: 12/30/2015 Name of Substance 3: Methamphetamine 3 - Age of First Use: 40 yrs old  3 - Amount (size/oz): 1 gram 3 - Frequency: daily  3 - Duration: 3 years 3 - Last Use / Amount: 12/30/2015   Name of Substance 5: Alcohol  5 - Age of First Use: 40 yrs old  5 - Amount (size/oz): 1 case 5 - Frequency: daily  5 - Duration: 25 years 5 - Last Use / Amount: 12/30/2015          Allergies:   Allergies  Allergen Reactions  . Latex Other (See Comments)    HX SUPERFICIAL THROMBOPHLEBITIS LEFT FOREARM  AFTER IV IN APR 2011  . Adhesive [Tape] Rash   Lab Results:  Results for orders placed or performed during the hospital encounter of 12/31/15 (from the past 48 hour(s))  Comprehensive metabolic panel     Status: Abnormal   Collection Time: 12/31/15  1:40 PM  Result Value Ref Range   Sodium 141 135 - 145 mmol/L   Potassium 3.9 3.5 - 5.1 mmol/L   Chloride 107 101 - 111 mmol/L   CO2 24 22 - 32 mmol/L   Glucose, Bld 133 (H) 65 - 99 mg/dL   BUN 15 6 - 20 mg/dL   Creatinine, Ser 0.91 0.61 - 1.24 mg/dL   Calcium 9.3 8.9 - 10.3 mg/dL   Total Protein 6.4 (L) 6.5 - 8.1 g/dL   Albumin 4.1 3.5 - 5.0 g/dL   AST 22 15 - 41 U/L   ALT 15 (L) 17 - 63 U/L   Alkaline Phosphatase 65 38 - 126 U/L   Total Bilirubin 1.2 0.3 - 1.2 mg/dL   GFR calc non Af Amer >60 >60 mL/min   GFR calc Af Amer >60 >60 mL/min    Comment: (NOTE) The eGFR has been calculated using the CKD EPI equation. This calculation has not been validated in all clinical situations. eGFR's persistently <60 mL/min signify possible Chronic Kidney Disease.    Anion gap 10 5 - 15  Ethanol     Status: None    Collection Time: 12/31/15  1:40 PM  Result Value Ref Range   Alcohol, Ethyl (B) <5 <5 mg/dL    Comment:        LOWEST DETECTABLE LIMIT FOR SERUM ALCOHOL IS 5 mg/dL FOR MEDICAL PURPOSES ONLY   Salicylate level     Status: None   Collection Time: 12/31/15  1:40 PM  Result Value Ref Range   Salicylate Lvl <8.1 2.8 - 30.0 mg/dL  Acetaminophen level     Status: Abnormal   Collection Time: 12/31/15  1:40 PM  Result Value Ref Range   Acetaminophen (Tylenol), Serum <10 (L) 10 - 30 ug/mL    Comment:        THERAPEUTIC CONCENTRATIONS VARY SIGNIFICANTLY. A RANGE OF 10-30 ug/mL MAY BE AN EFFECTIVE CONCENTRATION FOR MANY PATIENTS. HOWEVER, SOME ARE BEST TREATED AT CONCENTRATIONS OUTSIDE THIS RANGE. ACETAMINOPHEN CONCENTRATIONS >150 ug/mL AT 4 HOURS AFTER INGESTION AND >50 ug/mL AT 12 HOURS AFTER INGESTION ARE OFTEN ASSOCIATED WITH TOXIC REACTIONS.   cbc     Status: Abnormal   Collection Time: 12/31/15  1:40 PM  Result Value Ref Range   WBC 12.2 (H) 4.0 - 10.5 K/uL   RBC 4.76 4.22 - 5.81 MIL/uL   Hemoglobin 14.9 13.0 - 17.0 g/dL   HCT 42.2 39.0 - 52.0 %   MCV 88.7 78.0 - 100.0 fL   MCH 31.3 26.0 - 34.0 pg   MCHC 35.3 30.0 - 36.0 g/dL   RDW 13.7 11.5 - 15.5 %   Platelets 279 150 - 400 K/uL  Rapid urine drug screen (hospital performed)     Status: Abnormal   Collection Time: 12/31/15  1:47 PM  Result Value Ref Range   Opiates NONE DETECTED NONE DETECTED   Cocaine POSITIVE (A) NONE DETECTED   Benzodiazepines NONE DETECTED NONE DETECTED   Amphetamines POSITIVE (A) NONE DETECTED   Tetrahydrocannabinol POSITIVE (A) NONE DETECTED   Barbiturates NONE DETECTED NONE DETECTED    Comment:        DRUG SCREEN FOR MEDICAL PURPOSES ONLY.  IF CONFIRMATION  IS NEEDED FOR ANY PURPOSE, NOTIFY LAB WITHIN 5 DAYS.        LOWEST DETECTABLE LIMITS FOR URINE DRUG SCREEN Drug Class       Cutoff (ng/mL) Amphetamine      1000 Barbiturate      200 Benzodiazepine   016 Tricyclics       010 Opiates           300 Cocaine          300 THC              50     Blood Alcohol level:  Lab Results  Component Value Date   ETH <5 93/23/5573    Metabolic Disorder Labs:  No results found for: HGBA1C, MPG No results found for: PROLACTIN No results found for: CHOL, TRIG, HDL, CHOLHDL, VLDL, LDLCALC  Current Medications: Current Facility-Administered Medications  Medication Dose Route Frequency Provider Last Rate Last Dose  . acetaminophen (TYLENOL) tablet 650 mg  650 mg Oral Q6H PRN Delfin Gant, NP      . alum & mag hydroxide-simeth (MAALOX/MYLANTA) 200-200-20 MG/5ML suspension 30 mL  30 mL Oral Q4H PRN Delfin Gant, NP      . feeding supplement (ENSURE ENLIVE) (ENSURE ENLIVE) liquid 237 mL  237 mL Oral BID BM Nicholaus Bloom, MD   237 mL at 01/01/16 0802  . LORazepam (ATIVAN) tablet 0-4 mg  0-4 mg Oral Q6H Delfin Gant, NP   1 mg at 01/01/16 0626   Followed by  . [START ON 01/02/2016] LORazepam (ATIVAN) tablet 0-4 mg  0-4 mg Oral Q12H Delfin Gant, NP      . magnesium hydroxide (MILK OF MAGNESIA) suspension 30 mL  30 mL Oral Daily PRN Delfin Gant, NP      . nicotine (NICODERM CQ - dosed in mg/24 hours) patch 21 mg  21 mg Transdermal Daily Nicholaus Bloom, MD   21 mg at 01/01/16 0800   PTA Medications: Prescriptions prior to admission  Medication Sig Dispense Refill Last Dose  . ibuprofen (ADVIL,MOTRIN) 800 MG tablet Take 1 tablet (800 mg total) by mouth 3 (three) times daily. (Patient not taking: Reported on 12/31/2015) 21 tablet 0   . meloxicam (MOBIC) 15 MG tablet Take 1 tablet (15 mg total) by mouth daily. (Patient not taking: Reported on 12/31/2015) 10 tablet 0     Musculoskeletal: Strength & Muscle Tone: within normal limits Gait & Station: normal Patient leans: normal  Psychiatric Specialty Exam: Physical Exam  Nursing note and vitals reviewed. Constitutional: He is oriented to person, place, and time. He appears well-developed and well-nourished.   HENT:  Head: Normocephalic.  Neck: Normal range of motion. Neck supple.  Respiratory: Effort normal.  Musculoskeletal: Normal range of motion.  Neurological: He is alert and oriented to person, place, and time.  Psychiatric: His behavior is normal. Judgment and thought content normal.    Review of Systems  Constitutional: Positive for weight loss, malaise/fatigue and diaphoresis.  HENT:       Migraine like  Eyes: Negative.   Respiratory:       Pack a day  Cardiovascular: Negative.   Gastrointestinal: Negative.   Genitourinary: Negative.   Musculoskeletal:       Arm pain  Skin: Positive for itching and rash.  Neurological: Positive for weakness and headaches.  Endo/Heme/Allergies: Negative.   Psychiatric/Behavioral: Positive for depression and substance abuse. The patient is nervous/anxious and has insomnia.     Blood  pressure 130/88, pulse 68, temperature 97.7 F (36.5 C), temperature source Oral, resp. rate 18, height 6' (1.829 m), weight 63.05 kg (139 lb), SpO2 97 %.Body mass index is 18.85 kg/(m^2).  General Appearance: Fairly Groomed  Engineer, water::  Fair  Speech:  Clear and Coherent  Volume:  Normal  Mood:  Anxious and Depressed  Affect:  Depressed and Restricted  Thought Process:  Coherent and Goal Directed  Orientation:  Full (Time, Place, and Person)  Thought Content:  symptoms events worries concerns  Suicidal Thoughts:  No  Homicidal Thoughts:  No  Memory:  Immediate;   Fair Recent;   Fair Remote;   Fair  Judgement:  Fair  Insight:  Present and Shallow  Psychomotor Activity:  Restlessness  Concentration:  Fair  Recall:  [employer] of Knowledge:Fair  Language: Fair  Akathisia:  No  Handed:  Right  AIMS (if indicated):     Assets:  Desire for Improvement Housing Social Support  ADL's:  Intact  Cognition: WNL  Sleep:  Number of Hours: 6.75     Treatment Plan Summary: Daily contact with patient to assess and evaluate symptoms and progress in  treatment and Medication management Supportive approach/coping skills Meth cocaine abuse/dependece; monitor for mood changes from coming off the stimulants Alcohol abuse: Ativan detox protocol/work a relapse prevention plan Work with CBT/mindfulness Get collateral information to clarify and confirm the events Explore residential treatment options Observation Level/Precautions:  15 minute checks  Laboratory:  As per the ED  Psychotherapy:  Individual/group  Medications:  Ativan detox protocol/ reassess for the need for psychotropics  Consultations:    Discharge Concerns:    Estimated LOS: 3-5 days  Other:     I certify that inpatient services furnished can reasonably be expected to improve the patient's condition.    Nicholaus Bloom, MD 5/4/20179:58 AM

## 2016-01-01 NOTE — Tx Team (Signed)
Interdisciplinary Treatment Plan Update (Adult)  Date:  01/01/2016  Time Reviewed:  8:21 AM   Progress in Treatment: Attending groups: No. New to unit. Continuing to assess.  Participating in groups:  No. Taking medication as prescribed:  Yes. Tolerating medication:  Yes. Family/Significant othe contact made:  SPE required for this pt.  Patient understands diagnosis:  Yes. and As evidenced by:  seeking treatment for polysubstance abuse/detox, depression, passive SI, and medication stabilization. Discussing patient identified problems/goals with staff:  Yes. Medical problems stabilized or resolved:  Yes. Denies suicidal/homicidal ideation: Yes. Issues/concerns per patient self-inventory:  Other:   Discharge Plan or Barriers: CSW assessing for appropriate referrals. Pt has no current outpatient providers.   Reason for Continuation of Hospitalization: Depression Medication stabilization Withdrawal symptoms  Comments:  Steven Montoya is an 40 y.o. male. He presents to [facility] requesting detox treatment. Patient with polysubstance abuse. Patient uses cocaine, THC, methamphetamine, and drinks alcohol. Patient started using drugs at the age of 50-15 yrs old. He reports daily use of all substances. He has used all substance for 3 yrs consistently with the exception of alcohol she has used for the past 25 yrs. Patient's last used all substances "last night".  He also reports increased anxiety over the past several months associated with panic attacks. Patient has not history of of substance abuse treatment. No history of seizures. Patient denies current SI, HI, and AVH's. He no history of SI, HI, and AVH's. Patient explained that when he woke up this morning and he did have passive and fleeting thoughts of suicide. Patient sts, "I would never try to hurt myself and I don't want to hurt myself I just want detox". He also reports poor sleep over the past 4 days. Patient also has increased anxiety and panic  attacks. Patient denies a family history of mental health illness. Patient denies HI and AVH's. No outpatient mental health providers.Diagnosis: Polysubstance Abuse, Anxiety Disorder, and Major Depressive Disorder, Mild  Estimated length of stay:  3-5 days   New goal(s): to develop effective aftercare plan.   Additional Comments:  Patient and CSW reviewed pt's identified goals and treatment plan. Patient verbalized understanding and agreed to treatment plan. CSW reviewed [facility] "Discharge Process and Patient Involvement" Form. Pt verbalized understanding of information provided and signed form.    Review of initial/current patient goals per problem list:  1. Goal(s): Patient will participate in aftercare plan  Met: No.   Target date: at discharge  As evidenced by: Patient will participate within aftercare plan AEB aftercare provider and housing plan at discharge being identified.  5/4: CSW assessing for appropriate referrals.   2. Goal (s): Patient will exhibit decreased depressive symptoms and suicidal ideations.  Met: No.    Target date: at discharge  As evidenced by: Patient will utilize self rating of depression at 3 or below and demonstrate decreased signs of depression or be deemed stable for discharge by MD.  5/4: Pt rates depression as high but denies SI/HI/AVH.   3. Goal(s): Patient will demonstrate decreased signs of withdrawal due to substance abuse  Met:No.   Target date:at discharge   As evidenced by: Patient will produce a CIWA/COWS score of 0, have stable vitals signs, and no symptoms of withdrawal.  5/4: Pt reports mild withdrawals with CIWA score of 5 and stable vitals.   Attendees: Patient:   01/01/2016 8:21 AM   Family:   01/01/2016 8:21 AM   Physician:  Dr. Carlton Adam, MD 01/01/2016 8:21 AM  Nursing:   Berlin Hun RN 01/01/2016 8:21 AM   Clinical Social Worker: Maxie Better, LCSW 01/01/2016 8:21 AM   Clinical Social Worker: Erasmo Downer Drinkard LCSW  01/01/2016 8:21 AM   Other:  Gerline Legacy Nurse Case Manager 01/01/2016 8:21 AM   Other:  Agustina Caroli NP  01/01/2016 8:21 AM   Other:   01/01/2016 8:21 AM   Other:  01/01/2016 8:21 AM   Other:  01/01/2016 8:21 AM   Other:  01/01/2016 8:21 AM    01/01/2016 8:21 AM    01/01/2016 8:21 AM    01/01/2016 8:21 AM    01/01/2016 8:21 AM    Scribe for Treatment Team:   Maxie Better, LCSW 01/01/2016 8:21 AM

## 2016-01-01 NOTE — Progress Notes (Signed)
Patient ID: Mayer MaskerDonnie R Lierman, male   DOB: Mar 19, 1976, 40 y.o.   MRN: [id number]  DAR: Pt. Denies SI/HI and A/V Hallucinations. He reports sleep is poor, appetite is fair, energy level is low, and concentration is poor. He rates hopelessness 8-9/10 and anxiety 9/10. He continues to follow the Ativan protocol and reports mild withdrawals including cravings, agitation, sedation, and irritability. Patient does report pain in left elbow and PRN Tylenol was administered. Patient denies relief and was offered nonpharmacological interventions for pain but refused. Support and encouragement provided to the patient. Scheduled medications administered to patient per physician's orders. Patient is cooperative but minimal with Clinical research associatewriter. He is seen sitting in the hallway on a chair at times or near the nurses station and appears to want to speak with Clinical research associatewriter but when Clinical research associatewriter asks if he needs anything he denies. Q15 minute checks are maintained for safety.

## 2016-01-01 NOTE — Progress Notes (Signed)
NUTRITION ASSESSMENT  Pt identified as at risk on the Malnutrition Screen Tool  INTERVENTION: 1. Educated patient on the importance of nutrition and encouraged intake of food and beverages. 2. Discussed weight goals. 3. Supplements: continue Ensure Enlive po BID, each supplement provides 350 kcal and 20 grams of protein   NUTRITION DIAGNOSIS: Unintentional weight loss related to sub-optimal intake as evidenced by pt report.   Goal: Pt to meet >/= 90% of their estimated nutrition needs.  Monitor:  PO intake  Assessment:  Pt screened for MST. He was voluntarily admitted for detox related to polysubstance abuse including cocaine, THC, meth, and alcohol. He has been experiencing withdrawal related symptoms of increased fatigue, irritability, diarrhea and nausea, and periods of cold and hot sweats.   Per review, pt has lost 21 lbs (13% body weight) in the past 6 months which is significant for time frame. Ensure Shiela Mayernlive has already been ordered BID.  40 y.o. male  Height: Ht Readings from Last 1 Encounters:  12/31/15 6' (1.829 m)    Weight: Wt Readings from Last 1 Encounters:  12/31/15 139 lb (63.05 kg)    Weight Hx: Wt Readings from Last 10 Encounters:  12/31/15 139 lb (63.05 kg)  12/31/15 141 lb 1.6 oz (64.003 kg)  12/06/15 141 lb 4 oz (64.071 kg)  08/21/15 160 lb (72.576 kg)  05/18/15 165 lb (74.844 kg)  04/29/14 165 lb (74.844 kg)  10/14/13 132 lb 5 oz (60.017 kg)  08/07/13 137 lb (62.143 kg)  08/07/13 137 lb 12.8 oz (62.506 kg)  07/29/13 135 lb 1.6 oz (61.281 kg)    BMI:  Body mass index is 18.85 kg/(m^2). Pt meets criteria for normal weight/borderline underweight based on current BMI.  Estimated Nutritional Needs: Kcal: 25-30 kcal/kg Protein: > 1 gram protein/kg Fluid: 1 ml/kcal  Diet Order: Diet regular Room service appropriate?: Yes; Fluid consistency:: Thin Pt is also offered choice of unit snacks mid-morning and mid-afternoon.  Pt is eating as desired.    Lab results and medications reviewed.      Trenton GammonJessica Jody Silas, RD, LDN Inpatient Clinical Dietitian Pager # [phone] After hours/weekend pager # [phone]

## 2016-01-01 NOTE — Progress Notes (Signed)
[facility] Group Notes:  (Nursing/MHT/Case Management/Adjunct)  Date:  01/01/2016  Time:  2100 Type of Therapy:  wrap up group  Participation Level:  Active  Participation Quality:  Appropriate, Attentive, Sharing and Supportive  Affect:  Flat  Cognitive:  Appropriate  Insight:  Improving  Engagement in Group:  Engaged  Modes of Intervention:  Clarification, Education and Support    Summary of Progress/Problems: Pt reported that [facility] was [facility] and reported that he was just anxious to get it over with. Pt shared that he and his wife are getting clean at the same time and she too plans on [facility]. Pt reported having a court date on Monday morning and wanting to leave tomorrow so he can see his kids before treatment.   Marcille BuffyMcNeil, Said Rueb S 01/01/2016, 9:45 PM

## 2016-01-02 MED ORDER — HYDROCORTISONE 0.5 % EX CREA: TOPICAL_CREAM | Freq: Three times a day (TID) | CUTANEOUS | Status: DC | PRN

## 2016-01-02 MED ORDER — HYDROXYZINE HCL 25 MG PO TABS: ORAL_TABLET | ORAL | Status: DC

## 2016-01-02 MED ORDER — NICOTINE POLACRILEX 2 MG MT GUM: 2.0000 mg | CHEWING_GUM | OROMUCOSAL | Status: DC | PRN

## 2016-01-02 MED ORDER — HYDROCORTISONE 0.5 % EX CREA
TOPICAL_CREAM | Freq: Three times a day (TID) | CUTANEOUS | Status: DC | PRN
  Administered 2016-01-02: 12:00:00 via TOPICAL
  Filled 2016-01-02: qty 28.35

## 2016-01-02 NOTE — Progress Notes (Signed)
  [facility] Adult Case [facility] Discharge Plan :  Will you be returning to the same living situation after discharge:  Yes,  home At discharge, do you have transportation home?: Yes,  wife Do you have the ability to pay for your medications: Yes,  [facility]  Release of information consent forms completed and submitted to medical records by CSW.  Patient to Follow up at: Follow-up Information    Follow up with [facility].   Why:  Walk in between 8am-9am Monday through Friday for hospital follow-up/medication management/assessment for counseling services.   Contact information:   [address] [place], [place] [address] Phone: [phone] Fax: [phone]     [facility] referral sent today. 01/02/2016 10:38 AM   Next level of care provider has access to [facility] Link:no  Safety Planning and Suicide Prevention discussed: Yes,  SPE completed with pt's mother in law. SPI pamphlet and mobile crisis information provided to pt and he was encouraged to share information with support network.   Have you used any form of tobacco in the last 30 days? (Cigarettes, Smokeless Tobacco, Cigars, and/or Pipes): Yes  Has patient been referred to the Quitline?: Patient refused referral  Patient has been referred for addiction treatment: Yes  Smart, Ustin Cruickshank LCSW 01/02/2016, 10:36 AM

## 2016-01-02 NOTE — Discharge Summary (Signed)
Physician Discharge Summary Note  Patient:  Steven Montoya is an 40 y.o., male MRN:  [id number] DOB:  13-Jun-1976 Patient phone:  [phone] (home)  Patient address:   [address] [place] [address],  Total Time spent with patient: Greater than 30 minutes  Date of Admission:  12/31/2015  Date of Discharge: 01-02-00  Reason for Admission: Worsening symptoms of substance induced mood disorder.  Principal Problem: Other stimulant dependence with stimulant-induced mood disorder [facility])  Discharge Diagnoses: Patient Active Problem List   Diagnosis Date Noted  . 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 Depressive disorder [F32.9] 12/31/2015  . Rectal bleeding [K62.5] 07/25/2013   Past Psychiatric History: Polysubstance dependence  Past Medical History:  Past Medical History  Diagnosis Date  . Chronic urethral stricture   . Urgency of urination   . Frequency of urination   . Urinary straining   . Nocturia   . Lower abdominal pain   . Headache(784.0)   . Diverticulitis     Past Surgical History  Procedure Laterality Date  . Cysto/ retrograde pyelogram/ urethral dilation  11-05-2010  . Colonoscopy N/A 08/17/2013    Procedure: COLONOSCOPY;  Surgeon: Steven Levee, MD;  Location: [facility];  Service: Endoscopy;  Laterality: N/A;  . Tooth extraction      canines   Family History:  Family History  Problem Relation Age of Onset  . Heart disease Father    Family Psychiatric  History: See H&P  Social History:  History  Alcohol Use  . 100.8 oz/week  . 168 Cans of beer per week    Comment: Using for 25 years     History  Drug Use  . Yes  . Special: Marijuana, "Crack" cocaine, Methamphetamines    Comment: OCCASIONAL-before going to bed and for pain    Social History   Social History  . Marital Status: Married    Spouse Name: N/A  . Number of Children: N/A  . Years of Education: N/A   Social History Main  Topics  . Smoking status: Current Every Day Smoker -- 0.50 packs/day for 4 years    Types: Cigarettes  . Smokeless tobacco: Current User    Types: Chew     Comment: not chewed tobacco in 3 months  . Alcohol Use: 100.8 oz/week    168 Cans of beer per week     Comment: Using for 25 years  . Drug Use: Yes    Special: Marijuana, "Crack" cocaine, Methamphetamines     Comment: OCCASIONAL-before going to bed and for pain  . Sexual Activity: Not Asked   Other Topics Concern  . None   Social History Narrative   Hospital Course: 40 Y/O who states got in with the wrong crowd got on drugs real bad meth cocaine crack 3-4 years. All day every day. On disability "chemical imbalance" ADHD. States that "[facility] took his wife and his kid" his wife is also abusing drugs. His brother died of an OD in Jul 07, 2023. It has gotten worst since then. He presents to [facility] requesting detox treatment. Patient with polysubstance abuse. Patient uses cocaine, THC, methamphetamine, and drinks alcohol. Patient started using drugs at the age of [age] yrs old. He reports daily use of all substances. He has used all substance for 3 yrs consistently with the exception of alcohol he has used for the past 25 yrs. Patient's last used all substances "last night". He reports several withdrawal symptoms including fatigue, irritability, hot/cold flashes, diarrhea  this am (not currently), nausea. He also reports increased anxiety over the past several months associated with panic attacks. Patient has not history of of substance abuse treatment.     Steven Montoya was admitted to the hospital with [name] reports showing positive Amphetamine, Cocaine & THC. His BAL was <5 per toxicology tests results. He admitted having been drinking a lot & it has worsened. He was here for alcohol/drug detox. Steven Montoya's detoxification treatment was achieved using Ativan detox regimen on a tapering dose format. He was also medicated & discharged on Hydroxyzine 25 mg prn for  anxiety, Hydrocortisone cream 0.5% for itching & Nicorette gum for smoking cessation. He was enrolled in the group counseling sessions, AA/NA meetings being offered and held on this unit. He participated and learned coping skills. He tolerated his treatment regimen without any significant adverse effects and or reactions reported.  Steven Montoya has completed detox treatment and his mood is stable. This is evidenced by his reports of improved mood & absence of substance withdrawal symptoms. He will resume psychiatric care and routine medication management at [facility] [place], [place]. And for substance abuse treatments, Steven Montoya has referral to the [facility] [place], [place]. He was provided with all the necessary information needed to make this appointments without progress. He was encouraged to join/attend AA/NA meetings being offered and held within his community to achieve & maintain maximum sobriety.   Upon discharge, Steven Montoya adamantly denies any suicidal, homicidal ideations, auditory, visual hallucinations, delusional thoughts, paranoia & or withdrawal symptoms. [facility] personal belongings in no apparent distress. He received a 7 days worth supply samples of his [facility]. Transportation per wife.  Physical Findings: AIMS: Facial and Oral Movements Muscles of Facial Expression: None, normal Lips and Perioral Area: None, normal Jaw: None, normal Tongue: None, normal,Extremity Movements Upper (arms, wrists, hands, fingers): None, normal Lower (legs, knees, ankles, toes): None, normal, Trunk Movements Neck, shoulders, hips: None, normal, Overall Severity Severity of abnormal movements (highest score from questions above): None, normal Incapacitation due to abnormal movements: None, normal Patient's awareness of abnormal movements (rate only patient's report): No Awareness, Dental Status Current problems with teeth  and/or dentures?: No Does patient usually wear dentures?: No  CIWA:  CIWA-Ar Total: 2 COWS:     Musculoskeletal: Strength & Muscle Tone: within normal limits Gait & Station: normal Patient leans: N/A  Psychiatric Specialty Exam: Review of Systems  Constitutional: Negative.   HENT: Negative.   Eyes: Negative.   Respiratory: Negative.   Cardiovascular: Negative.   Gastrointestinal: Negative.   Genitourinary: Negative.   Musculoskeletal: Negative.   Skin: Negative.   Neurological: Negative.   Endo/Heme/Allergies: Negative.   Psychiatric/Behavioral: Positive for depression (Stable) and substance abuse (Polysubstance dependence). Negative for suicidal ideas, hallucinations and memory loss. The patient has insomnia (Stable). The patient is not nervous/anxious.     Blood pressure 143/88, pulse 76, temperature 97.6 F (36.4 C), temperature source Oral, resp. rate 20, height 6' (1.829 m), weight 63.05 kg (139 lb), SpO2 97 %.Body mass index is 18.85 kg/(m^2).  See Md's SRA   Have you used any form of tobacco in the last 30 days? (Cigarettes, Smokeless Tobacco, Cigars, and/or Pipes): Yes  Has this patient used any form of tobacco in the last 30 days? (Cigarettes, Smokeless Tobacco, Cigars, and/or Pipes): Yes, Nicotine gum prescription provided.  Blood Alcohol level:  Lab Results  Component Value Date   [facility] 12/31/2015  Metabolic Disorder Labs:  No results found for: HGBA1C, MPG No results found for: PROLACTIN No results found for: CHOL, TRIG, HDL, CHOLHDL, VLDL, LDLCALC  See Psychiatric Specialty Exam and Suicide Risk Assessment completed by Attending Physician prior to discharge.  Discharge destination:  Home  Is patient on multiple antipsychotic therapies at discharge:  No   Has Patient had three or more failed trials of antipsychotic monotherapy by history:  No  Recommended Plan for Multiple Antipsychotic Therapies: NA    Medication List    STOP taking these  medications        ibuprofen 800 MG tablet  Commonly known as:  ADVIL,MOTRIN     meloxicam 15 MG tablet  Commonly known as:  MOBIC      TAKE these medications      Indication   hydrocortisone cream 0.5 %  Apply topically 3 (three) times daily as needed for itching.   Indication:  Itching     hydrOXYzine 25 MG tablet  Commonly known as:  ATARAX/VISTARIL  Take 1 tablet (25 mg) four times daily as needed: For anxiety   Indication:  Anxiety     nicotine polacrilex 2 MG gum  Commonly known as:  NICORETTE  Take 1 each (2 mg total) by mouth as needed for smoking cessation.   Indication:  Nicotine Addiction       Follow-up Information    Follow up with [facility].   Why:  Walk in between 8am-9am Monday through Friday for hospital follow-up/medication management/assessment for counseling services.   Contact information:   [address], [place] [address] Phone: [phone] Fax: [phone]      Follow up with [facility].   Why:  Referral faxed: 01/02/16. Please call Shayla on Monday at 9:00AM to check status of referral/waitlist. Thank you.    Contact information:   [address] [place], [place] [address] Phone: [phone] Fax: [phone]     Follow-up recommendations: Activity:  As tolerated Diet: As recommended by your primary care doctor. Keep all scheduled follow-up appointments as recommended.   Comments: Take all your medications as prescribed by your mental healthcare provider. Report any adverse effects and or reactions from your medicines to your outpatient provider promptly. Patient is instructed and cautioned to not engage in alcohol and or illegal drug use while on prescription medicines. In the event of worsening symptoms, patient is instructed to call the crisis hotline, 911 and or go to the nearest ED for appropriate evaluation and treatment of symptoms. Follow-up with your primary care provider for your other medical issues, concerns and  or health care needs.   Signed: Sanjuana Kava, NP, PMHNP, FNP-BC 01/02/2016, 11:11 AM  I personally assessed the patient and formulated the plan Madie Reno A. Dub Mikes, M.D.

## 2016-01-02 NOTE — Plan of Care (Signed)
Problem: Alteration in mood & ability to function due to Goal: STG-Patient will attend groups Outcome: Progressing Pt attended and engaged in evening wrap up group     

## 2016-01-02 NOTE — BHH Suicide Risk Assessment (Signed)
[facility] Discharge Suicide Risk Assessment   Principal Problem: <principal problem not specified> Discharge Diagnoses:  Patient Active Problem List   Diagnosis Date Noted  . 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 Depressive disorder [F32.9] 12/31/2015  . Rectal bleeding [K62.5] 07/25/2013    Total Time spent with patient: 20 minutes  Musculoskeletal: Strength & Muscle Tone: within normal limits Gait & Station: normal Patient leans: normal  Psychiatric Specialty Exam: Review of Systems  Constitutional: Negative.   HENT: Negative.   Eyes: Negative.   Respiratory: Negative.   Cardiovascular: Negative.   Gastrointestinal: Negative.   Genitourinary: Negative.   Musculoskeletal: Negative.   Skin: Positive for itching and rash.  Neurological: Negative.   Endo/Heme/Allergies: Negative.   Psychiatric/Behavioral: Positive for substance abuse.    Blood pressure 143/88, pulse 76, temperature 97.6 F (36.4 C), temperature source Oral, resp. rate 20, height 6' (1.829 m), weight 63.05 kg (139 lb), SpO2 97 %.Body mass index is 18.85 kg/(m^2).  General Appearance: Fairly Groomed  Patent attorney::  Fair  Speech:  Clear and Coherent409  Volume:  Normal  Mood:  Euthymic  Affect:  Appropriate  Thought Process:  Coherent and Goal Directed  Orientation:  Full (Time, Place, and Person)  Thought Content:  plans as he moves on, relapse prevention plan  Suicidal Thoughts:  No  Homicidal Thoughts:  No  Memory:  Immediate;   Fair Recent;   Fair Remote;   Fair  Judgement:  Fair  Insight:  Present  Psychomotor Activity:  Normal  Concentration:  Fair  Recall:  [employer] of Knowledge:Fair  Language: Fair  Akathisia:  No  Handed:  Right  AIMS (if indicated):     Assets:  Desire for Improvement Housing Social Support  Sleep:  Number of Hours: 6.5  Cognition: WNL  ADL's:  Intact  States that he is going home today, and will work towards  getting to [facility] on Monday. His wife is going to [facility]. States they are both committed. States that his phone was destroyed by his mother in law. He has no way of contacting his suppliers and will not make any efforts in trying to do so. States that the death of his brother should have been enough for a motivation to quit but he was not ready then. He will see his kids today and tomorrow and be with his wife until she goes to rehab on Monday or Tuesday. He plans to follow trough going to [facility] Mental Status Per Nursing Assessment::   On Admission:  Suicidal ideation indicated by patient  Demographic Factors:  Male and Caucasian  Loss Factors: none identified  Historical Factors: Family history of mental illness or substance abuse and Impulsivity  Risk Reduction Factors:   Sense of responsibility to family, Living with another person, especially a relative and Positive social support  Continued Clinical Symptoms:  Alcohol/Substance Abuse/Dependencies  Cognitive Features That Contribute To Risk:  None    Suicide Risk:  Minimal: No identifiable suicidal ideation.  Patients presenting with no risk factors but with morbid ruminations; may be classified as minimal risk based on the severity of the depressive symptoms  Follow-up Information    Follow up with [facility].   Why:  Walk in between 8am-9am Monday through Friday for hospital follow-up/medication management/assessment for counseling services.   Contact information:   [address] [place] [address] Phone: [phone] Fax: [phone]      Follow up with [facility].   Why:  Referral faxed: 01/02/16. Please call Shayla on Monday at 9:00AM to check status of referral/waitlist. Thank you.    Contact information:   [address] [place], [place] [address] Phone: [phone] Fax: [phone]      Plan Of Care/Follow-up recommendations:  Activity:  as tolerated Diet:  regular Follow up as above Gualberto Wahlen  A, MD 01/02/2016, 11:29 AM

## 2016-01-02 NOTE — Tx Team (Signed)
Interdisciplinary Treatment Plan Update (Adult)  Date:  01/02/2016  Time Reviewed:  10:25 AM   Progress in Treatment: Attending groups: Yes Participating in groups:  Yes Taking medication as prescribed:  Yes. Tolerating medication:  Yes. Family/Significant othe contact made:  SPE completed with pt's mother in law.  Patient understands diagnosis:  Yes. and As evidenced by:  seeking treatment for polysubstance abuse/detox, depression, passive SI, and medication stabilization. Discussing patient identified problems/goals with staff:  Yes. Medical problems stabilized or resolved:  Yes. Denies suicidal/homicidal ideation: Yes. Issues/concerns per patient self-inventory:  Other:   Discharge Plan or Barriers: [facility] referral made. Pt ineligible for [facility] because his wife is there. [facility] for outpatient services. Pt to return home and wait for bed to become available.   Reason for Continuation of Hospitalization: none  Comments:  Steven Montoya is an 40 y.o. male. He presents to [facility] requesting detox treatment. Patient with polysubstance abuse. Patient uses cocaine, THC, methamphetamine, and drinks alcohol. Patient started using drugs at the age of 25-15 yrs old. He reports daily use of all substances. He has used all substance for 3 yrs consistently with the exception of alcohol she has used for the past 25 yrs. Patient's last used all substances "last night".  He also reports increased anxiety over the past several months associated with panic attacks. Patient has not history of of substance abuse treatment. No history of seizures. Patient denies current SI, HI, and AVH's. He no history of SI, HI, and AVH's. Patient explained that when he woke up this morning and he did have passive and fleeting thoughts of suicide. Patient sts, "I would never try to hurt myself and I don't want to hurt myself I just want detox". He also reports poor sleep over the past 4 days. Patient also has increased anxiety and  panic attacks. Patient denies a family history of mental health illness. Patient denies HI and AVH's. No outpatient mental health providers.Diagnosis: Polysubstance Abuse, Anxiety Disorder, and Major Depressive Disorder, Mild  Estimated length of stay:  D/c today   Additional Comments:  Patient and CSW reviewed pt's identified goals and treatment plan. Patient verbalized understanding and agreed to treatment plan. CSW reviewed [facility] "Discharge Process and Patient Involvement" Form. Pt verbalized understanding of information provided and signed form.    Review of initial/current patient goals per problem list:  1. Goal(s): Patient will participate in aftercare plan  Met: Yes  Target date: at discharge  As evidenced by: Patient will participate within aftercare plan AEB aftercare provider and housing plan at discharge being identified.  5/4: CSW assessing for appropriate referrals.   5/5: Referral made to [facility]; pt to return home and wait for bed. Pt to follow-up at [facility].   2. Goal (s): Patient will exhibit decreased depressive symptoms and suicidal ideations.  Met: No.    Target date: at discharge  As evidenced by: Patient will utilize self rating of depression at 3 or below and demonstrate decreased signs of depression or be deemed stable for discharge by MD.  5/4: Pt rates depression as high but denies SI/HI/AVH.   3. Goal(s): Patient will demonstrate decreased signs of withdrawal due to substance abuse  Met:Yes  Target date:at discharge   As evidenced by: Patient will produce a CIWA/COWS score of 0, have stable vitals signs, and no symptoms of withdrawal.  5/4: Pt reports mild withdrawals with CIWA score of 5 and stable vitals.   5/5: Pt reports no signs of withdrawal with CIWA of  2 and high BP. Per MD, pt is stable for discharge.   Attendees: Patient:   01/02/2016 10:25 AM   Family:   01/02/2016 10:25 AM   Physician:  Dr. Carlton Adam, MD 01/02/2016 10:25 AM    Nursing:   Berlin Hun RN 01/02/2016 10:25 AM   Clinical Social Worker: Maxie Better, LCSW 01/02/2016 10:25 AM   Clinical Social Worker: Erasmo Downer Drinkard LCSW 01/02/2016 10:25 AM   Other:  Gerline Legacy Nurse Case Manager 01/02/2016 10:25 AM   Other:  Agustina Caroli NP  01/02/2016 10:25 AM   Other:   01/02/2016 10:25 AM   Other:  01/02/2016 10:25 AM   Other:  01/02/2016 10:25 AM   Other:  01/02/2016 10:25 AM    01/02/2016 10:25 AM    01/02/2016 10:25 AM    01/02/2016 10:25 AM    01/02/2016 10:25 AM    Scribe for Treatment Team:   Maxie Better, LCSW 01/02/2016 10:25 AM

## 2016-01-02 NOTE — Progress Notes (Signed)
  Patient discharged per physician order; Assigned nurse reviewed prescriptions, AVS, transaction record, bhh suicide risk assessment note with the patient and it was given to the patient, Patient signed that he received all belongings, patient had no other questions at this time. Patient left the unit ambulatory 

## 2016-01-02 NOTE — Progress Notes (Signed)
CSW received voicemail from pt's mother, Rhunette CroftMildred requesting call back prior to his discharge today. CSW [facility] with [facility] who stated that he does not want CSW to contact his mother. CSW encouraged pt to reach out to his mother and let her know that he did not consent to contact.  Trula SladeHeather Smart, MSW, LCSW Clinical Social Worker 01/02/2016 12:31 PM

## 2016-01-02 NOTE — Progress Notes (Signed)
Recreation Therapy Notes  Date: 05.05.2017 Time: 9:30am Location: 30 Hall Group Room   Group Topic: Stress Management  Goal Area(s) Addresses:  Patient will actively participate in stress management techniques presented during session.   Behavioral Response: Did not attend   Brantlee Penn L Decklyn Hornik, LRT/CTRS        Najmah Carradine L 01/02/2016 9:58 AM 

## 2016-01-02 NOTE — [facility] Group Notes (Signed)
[facility]   01/02/2016 9:28 AM  Participation Quality:  DID NOT ATTEND. Pt chose to rest in room.   Smart, Shadeed Colberg LCSW

## 2016-01-02 NOTE — Progress Notes (Signed)
Patient ID: Mayer MaskerDonnie R Bahena, male   DOB: [birth year], 40 y.o.   MRN: [id number] D: "I feel a lot better today than I felt yesterday". Patient reports tolerating medication well. Pt had slight tremors and anxiety as withdrawal symptoms. Pt in dayroom watching TV and interacting well with peers. Denies  SI/HI/AVH and pain.  A: Support and encouragement offered as needed. Medications administered as prescribed.  R: Patient cooperative and appropriate on unit. Will continue to monitor patient for safety and stability.

## 2016-07-08 ENCOUNTER — Emergency Department (HOSPITAL_COMMUNITY)
Admission: EM | Admit: 2016-07-08 | Discharge: 2016-07-09 | Disposition: A | Discharge status: Home / Self Care | Attending: Emergency Medicine | Admitting: Emergency Medicine

## 2016-07-08 ENCOUNTER — Emergency Department (HOSPITAL_COMMUNITY)

## 2016-07-08 ENCOUNTER — Encounter (HOSPITAL_COMMUNITY): Payer: Self-pay | Admitting: *Deleted

## 2016-07-08 DIAGNOSIS — X58XXXA Exposure to other specified factors, initial encounter: Secondary | ICD-10-CM | POA: Insufficient documentation

## 2016-07-08 DIAGNOSIS — Y9339 Activity, other involving climbing, rappelling and jumping off: Secondary | ICD-10-CM | POA: Diagnosis not present

## 2016-07-08 DIAGNOSIS — S299XXA Unspecified injury of thorax, initial encounter: Secondary | ICD-10-CM | POA: Insufficient documentation

## 2016-07-08 DIAGNOSIS — Y999 Unspecified external cause status: Secondary | ICD-10-CM | POA: Diagnosis not present

## 2016-07-08 DIAGNOSIS — F17220 Nicotine dependence, chewing tobacco, uncomplicated: Secondary | ICD-10-CM | POA: Insufficient documentation

## 2016-07-08 DIAGNOSIS — Y92149 Unspecified place in prison as the place of occurrence of the external cause: Secondary | ICD-10-CM | POA: Insufficient documentation

## 2016-07-08 DIAGNOSIS — S298XXA Other specified injuries of thorax, initial encounter: Secondary | ICD-10-CM

## 2016-07-08 HISTORY — DX: Nonrheumatic mitral (valve) prolapse: I34.1

## 2016-07-08 NOTE — ED Triage Notes (Signed)
[facility] states they were called out for chest pain. Pt reports to them that he is having right sided chest pain since lunchtime. Increases with moving, palpation, and deep inhalation.

## 2016-07-08 NOTE — ED Triage Notes (Signed)
Pt reports that he was trying to [facility] and he felt like something ripped in his right chest. Pt reports it hurts to raise his right arm and it hurts to take a deep breath and when he yawns.

## 2016-07-09 NOTE — ED Provider Notes (Signed)
[facility]-EMERGENCY DEPT Provider Note   CSN: [id number] Arrival date & time: 07/08/16  2129  By signing my name below, I, Alyssa GroveMartin Green, attest that this documentation has been prepared under the direction and in the presence of Zadie Rhineonald Anushka Hartinger, MD. Electronically Signed: Alyssa GroveMartin Green, ED Scribe. 07/09/16. 12:55 AM.   History   Chief Complaint Chief Complaint  Patient presents with  . Chest Injury   The history is provided by the patient. No language interpreter was used.  Chest Pain   This is a new problem. The current episode started 12 to 24 hours ago. The problem occurs constantly. The problem has not changed since onset.Associated symptoms include shortness of breath. Pertinent negatives include no abdominal pain, no cough, no fever and no vomiting.   HPI Comments: Steven Montoya is a 40 y.o. male with mitral valve prolapse who presents to the Emergency Department complaining of constant lower right sided chest pain onset earlier today. Pain is exacerbated with movement and deep inhalation. He states he was getting out of his bunk this morning when he "felt something rip" in his chest. He denies recent fall or trauma to the area. Reports associated shortness of breath. No fever, vomiting, coughing up blood, abdominal pain, leg swelling. He does not take any regular medications.  Past Medical History:  Diagnosis Date  . Chronic urethral stricture   . Diverticulitis   . Frequency of urination   . Headache(784.0)   . Lower abdominal pain   . Mitral valve prolapse   . Nocturia   . Urgency of urination   . Urinary straining     Patient Active Problem List   Diagnosis Date Noted  . 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 Depressive disorder 12/31/2015  . Rectal bleeding 07/25/2013    Past Surgical History:  Procedure Laterality Date  . COLONOSCOPY N/A 08/17/2013   Procedure: COLONOSCOPY;  Surgeon: Romie LeveeAlicia Thomas, MD;  Location: [facility]  ENDOSCOPY;  Service: Endoscopy;  Laterality: N/A;  . CYSTO/ RETROGRADE PYELOGRAM/ URETHRAL DILATION  11-05-2010  . TOOTH EXTRACTION     canines       Home Medications    Prior to Admission medications   Medication Sig Start Date End Date Taking? Authorizing Provider  hydrocortisone cream 0.5 % Apply topically 3 (three) times daily as needed for itching. 01/02/16   Sanjuana KavaAgnes I Nwoko, NP  hydrOXYzine (ATARAX/VISTARIL) 25 MG tablet Take 1 tablet (25 mg) four times daily as needed: For anxiety 01/02/16   Sanjuana KavaAgnes I Nwoko, NP  nicotine polacrilex (NICORETTE) 2 MG gum Take 1 each (2 mg total) by mouth as needed for smoking cessation. 01/02/16   Sanjuana KavaAgnes I Nwoko, NP    Family History Family History  Problem Relation Age of Onset  . Heart disease Father     Social History Social History  Substance Use Topics  . Smoking status: Former Smoker    Packs/day: 0.50    Years: 4.00    Types: Cigarettes  . Smokeless tobacco: Current User    Types: Chew     Comment: not chewed tobacco in 3 months  . Alcohol use No     Comment: Using for 25 years     Allergies   Latex and Adhesive [tape]   Review of Systems Review of Systems  Constitutional: Negative for fever.  Respiratory: Positive for shortness of breath. Negative for cough.        - Coughing up blood  Cardiovascular: Positive for chest pain. Negative  for leg swelling.  Gastrointestinal: Negative for abdominal pain and vomiting.  All other systems reviewed and are negative.  Physical Exam Updated Vital Signs BP 131/93 (BP Location: Right Arm)   Pulse 68   Temp 98.2 F (36.8 C) (Oral)   Resp 20   Ht 5\' 11"  (1.803 m)   Wt 160 lb (72.6 kg)   SpO2 98%   BMI 22.32 kg/m  Hydrographic surveyorLaw enforcement officer present for entire exam Physical Exam CONSTITUTIONAL: Well developed/well nourished, hands are in cuffs  HEAD: Normocephalic/atraumatic EYES: EOMI/PERRL ENMT: Mucous membranes moist NECK: supple no meningeal signs SPINE/BACK:entire spine  nontender CV: S1/S2 noted, murmur noted LUNGS: Lungs are clear to auscultation bilaterally, no apparent distress CHEST: diffuse right sided chest wall tenderness, no bruising or crepitus  ABDOMEN: soft, nontender GU:no cva tenderness NEURO: Pt is awake/alert/appropriate, moves all extremitiesx4.  No facial droop.  He is ambulatory without difficulty EXTREMITIES: pulses normal/equal, full ROM, no lower extremity edema or tenderness SKIN: warm, color normal PSYCH: no abnormalities of mood noted, alert and oriented to situation  ED Treatments / Results  DIAGNOSTIC STUDIES: Oxygen Saturation is 98% on RA, normal by my interpretation.    COORDINATION OF CARE: 12:42 AM Discussed treatment plan with pt at bedside which includes Tylenol and pt agreed to plan.  Labs (all labs ordered are listed, but only abnormal results are displayed) Labs Reviewed - No data to display  EKG  EKG Interpretation None       Radiology Dg Chest 2 View  Result Date: 07/08/2016 CLINICAL DATA:  Right-sided chest pain increasing with movement and inspiration since an injury at around 1210 hours today. Former smoker. EXAM: CHEST  2 VIEW COMPARISON:  11/05/2010 FINDINGS: Normal heart size and pulmonary vascularity. No focal airspace disease or consolidation in the lungs. No blunting of costophrenic angles. No pneumothorax. Mediastinal contours appear intact. Old right posterior rib fracture. No change since prior study. IMPRESSION: No active cardiopulmonary disease. Electronically Signed   By: Burman NievesWilliam  Stevens M.D.   On: 07/08/2016 22:37    Procedures Procedures (including critical care time)  Medications Ordered in ED Medications - No data to display   Initial Impression / Assessment and Plan / ED Course  I have reviewed the triage vital signs and the nursing notes.  Pertinent  imaging results that were available during my care of the patient were reviewed by me and considered in my medical decision making  (see chart for details).  Pt with obvious RIGHT sided chest wall tenderness.  No bruising or crepitus.  This is clearly reproducible.  I doubt ACS/PE at this time Also - he reports he was just seen by cardiology recently and murmur/MVP are already known and no medications have been started.    He is appropriate for discharge  Clinical Course    I personally performed the services described in this documentation, which was scribed in my presence. The recorded information has been reviewed and is accurate.        Final Clinical Impressions(s) / ED Diagnoses   Final diagnoses:  Blunt trauma to chest, initial encounter    New Prescriptions New Prescriptions   No medications on file     Zadie Rhineonald Donnella Morford, MD 07/09/16 [id number]

## 2016-07-09 NOTE — ED Notes (Signed)
Discharge instruction reviewed w/ pt.

## 2016-07-09 NOTE — Discharge Instructions (Signed)
PLEASE PLACE PATIENT ON LIMITED ACTIVITY FOR NEXT 3-5 DAYS

## 2016-07-09 NOTE — ED Notes (Signed)
Report called to Triage nurse, Adwenne.

## 2016-10-01 ENCOUNTER — Encounter: Payer: Self-pay | Admitting: Cardiology

## 2016-10-01 ENCOUNTER — Ambulatory Visit (INDEPENDENT_AMBULATORY_CARE_PROVIDER_SITE_OTHER): Payer: Medicaid Other | Admitting: Cardiology

## 2016-10-01 ENCOUNTER — Encounter (INDEPENDENT_AMBULATORY_CARE_PROVIDER_SITE_OTHER): Payer: Self-pay

## 2016-10-01 VITALS — BP 136/90 | HR 94 | Ht 72.0 in | Wt 160.8 lb

## 2016-10-01 DIAGNOSIS — I341 Nonrheumatic mitral (valve) prolapse: Secondary | ICD-10-CM

## 2016-10-01 DIAGNOSIS — R011 Cardiac murmur, unspecified: Secondary | ICD-10-CM | POA: Diagnosis not present

## 2016-10-01 DIAGNOSIS — I340 Nonrheumatic mitral (valve) insufficiency: Secondary | ICD-10-CM

## 2016-10-01 DIAGNOSIS — F17200 Nicotine dependence, unspecified, uncomplicated: Secondary | ICD-10-CM

## 2016-10-01 NOTE — Patient Instructions (Signed)
Medication Instructions:  The current medical regimen is effective;  continue present plan and medications.  Testing/Procedures: Your physician has requested that you have an echocardiogram. Echocardiography is a painless test that uses sound waves to create images of your heart. It provides your doctor with information about the size and shape of your heart and how well your heart's chambers and valves are working. This procedure takes approximately one hour. There are no restrictions for this procedure.  Follow-Up: Follow up in 6 months with Dr. Skains.  You will receive a letter in the mail 2 months before you are due.  Please call us when you receive this letter to schedule your follow up appointment.  If you need a refill on your cardiac medications before your next appointment, please call your pharmacy.  Thank you for choosing [facility]!!       

## 2016-10-01 NOTE — Progress Notes (Signed)
Cardiology Office Note    Date:  10/02/2016   ID:  Steven Montoya, DOB [birth year], MRN [id number]  PCP:  No PCP Per Patient  Cardiologist:   Steven SchultzMark Corneluis Allston, MD     History of Present Illness:  Steven Montoya is a 41 y.o. male here for evaluation of mitral valve prolapse and moderate mitral regurgitation at the request of Steven Montoya.   [facility] noted loud murmur. ECHO ordered and showed findings above with normal EF, and mild LAE. No SOB, no syncope, no diaphoresis. No recent fevers.   Has history of drug abuse, ETOH. He no longer uses he states. He does admit to smoking.   No history of endocarditits.   His mother was present. His father, I believe, had open heart surgery and died shortly thereafter, potentially after mitral valve repair.   Was in ER while in prison with complaints of chest pain, "a pop" that occurred when extending. He felt short of breath at the time. Resolved. ECG reviewed with NSR and peaked T waves, early repolarization. CXR was normal.    ? TIA in 2007  Feels well today.     Past Medical History:  Diagnosis Date  . Chronic urethral stricture   . Diverticulitis   . Frequency of urination   . Headache(784.0)   . Lower abdominal pain   . Mitral valve prolapse   . Nocturia   . Urgency of urination   . Urinary straining     Past Surgical History:  Procedure Laterality Date  . COLONOSCOPY N/A 08/17/2013   Procedure: COLONOSCOPY;  Surgeon: Steven LeveeAlicia Thomas, MD;  Location: [facility];  Service: Endoscopy;  Laterality: N/A;  . CYSTO/ RETROGRADE PYELOGRAM/ URETHRAL DILATION  11-05-2010  . TOOTH EXTRACTION     canines    Current Medications: Outpatient Medications Prior to Visit  Medication Sig Dispense Refill  . hydrocortisone cream 0.5 % Apply topically 3 (three) times daily as needed for itching. 30 g 0  . hydrOXYzine (ATARAX/VISTARIL) 25 MG tablet Take 1 tablet (25 mg) four times daily as  needed: For anxiety 60 tablet 0  . nicotine polacrilex (NICORETTE) 2 MG gum Take 1 each (2 mg total) by mouth as needed for smoking cessation. 100 tablet 0   No Montoya-administered medications prior to visit.      Allergies:   Latex and Adhesive [tape]   Social History   Social History  . Marital status: Married    Spouse name: N/A  . Number of children: N/A  . Years of education: N/A   Social History Main Topics  . Smoking status: Former Smoker    Packs/day: 0.50    Years: 4.00    Types: Cigarettes  . Smokeless tobacco: Current User    Types: Chew     Comment: not chewed tobacco in 3 months  . Alcohol use No     Comment: Using for 25 years  . Drug use: No     Comment: pt is in jail  . Sexual activity: Not Asked   Other Topics Concern  . None   Social History Narrative  . None     Family History:  The patient's family history includes Heart disease in his father.   ROS:   Please see the history of present illness.    ROS All other systems reviewed and are negative.   PHYSICAL EXAM:   VS:  BP 136/90   Pulse 94  Ht 6' (1.829 m)   Wt 160 lb 12.8 oz (72.9 kg)   BMI 21.81 kg/m    GEN: Well nourished, well developed, in no acute distress  HEENT: normal  Neck: no JVD, carotid bruits, or masses Cardiac: RRR; loud 4/6 HSM apex, no rubs, or gallops,no edema  Respiratory:  clear to auscultation bilaterally, normal work of breathing GI: soft, nontender, nondistended, + BS MS: no deformity or atrophy  Skin: warm and dry, no rash, tattoos Neuro:  Alert and Oriented x 3, Strength and sensation are intact Psych: euthymic mood, full affect  Wt Readings from Last 3 Encounters:  10/01/16 160 lb 12.8 oz (72.9 kg)  07/08/16 160 lb (72.6 kg)  12/31/15 139 lb (63 kg)      Studies/Labs Reviewed:   EKG:  EKG is ordered today.  The ekg ordered today demonstrates NSR, LVH, early replolarization pattern, mild TWI inferior leads. When compared to prior, T wave peaking is  less prominent. Personally viewed.  Recent Labs: 12/31/2015: ALT 15; BUN 15; Creatinine, Ser 0.91; Hemoglobin 14.9; Platelets 279; Potassium 3.9; Sodium 141   Lipid Panel No results found for: CHOL, TRIG, HDL, CHOLHDL, VLDL, LDLCALC, LDLDIRECT  Additional studies/ records that were reviewed today include:  CXR - normal, personally viewed.  ECHO 09/10/16 - [facility] - Normal EF 55%, moderate LV dilatation, (6.5)  moderate MVP, moderate MR, mild LAE (4.2)    ASSESSMENT:    1. MVP (mitral valve prolapse)   2. Murmur   3. Non-rheumatic mitral regurgitation   4. Smoker      PLAN:  In order of problems listed above:  Moderate MV Regurgitation with moderate posterior mitral valve prolapse.  - ECHO repeat here  - no signs of heart failure or fluid overload  - will monitor with echocardiograms in the future, especially with LV dialation. May benefit from TEE  - Will follow closely.  Family history of MV disease  - father died after surgery  Smoker  - encouraged cessation.   History of polysubstance abuse  - no history of endocarditis    Medication Adjustments/Labs and Tests Ordered: Current medicines are reviewed at length with the patient today.  Concerns regarding medicines are outlined above.  Medication changes, Labs and Tests ordered today are listed in the Patient Instructions below. Patient Instructions  Medication Instructions:  The current medical regimen is effective;  continue present plan and medications.  Testing/Procedures: Your physician has requested that you have an echocardiogram. Echocardiography is a painless test that uses sound waves to create images of your heart. It provides your doctor with information about the size and shape of your heart and how well your heart's chambers and valves are working. This procedure takes approximately one hour. There are no restrictions for this procedure.  Follow-Up: Follow up in 6 months with Steven Montoya.  You  will receive a letter in the mail 2 months before you are due.  Please call us when you receive this letter to schedule your follow up appointment.  If you need a refill on your cardiac medications before your next appointment, please call your pharmacy.  Thank you for choosing [facility]!!        Signed, Steven Schultz, MD  10/02/2016 7:40 AM    [facility] [address] [place], [place], [place]  [address] Phone: [phone]; Fax: [phone]

## 2016-10-02 DIAGNOSIS — F17200 Nicotine dependence, unspecified, uncomplicated: Secondary | ICD-10-CM | POA: Insufficient documentation

## 2016-10-02 DIAGNOSIS — I340 Nonrheumatic mitral (valve) insufficiency: Secondary | ICD-10-CM | POA: Insufficient documentation

## 2016-10-02 DIAGNOSIS — I341 Nonrheumatic mitral (valve) prolapse: Secondary | ICD-10-CM | POA: Insufficient documentation

## 2016-10-14 ENCOUNTER — Ambulatory Visit (HOSPITAL_COMMUNITY): Payer: Medicaid Other | Attending: Cardiovascular Disease

## 2016-11-05 ENCOUNTER — Encounter: Payer: Self-pay | Admitting: Cardiology

## 2017-01-13 ENCOUNTER — Encounter (HOSPITAL_COMMUNITY): Payer: Self-pay | Admitting: Emergency Medicine

## 2017-01-13 ENCOUNTER — Emergency Department (HOSPITAL_COMMUNITY)
Admission: EM | Admit: 2017-01-13 | Discharge: 2017-01-13 | Disposition: A | Discharge status: Home / Self Care | Payer: Medicaid Other | Attending: Emergency Medicine | Admitting: Emergency Medicine

## 2017-01-13 DIAGNOSIS — R110 Nausea: Secondary | ICD-10-CM | POA: Insufficient documentation

## 2017-01-13 DIAGNOSIS — R61 Generalized hyperhidrosis: Secondary | ICD-10-CM | POA: Insufficient documentation

## 2017-01-13 DIAGNOSIS — Z91040 Latex allergy status: Secondary | ICD-10-CM | POA: Diagnosis not present

## 2017-01-13 DIAGNOSIS — Y999 Unspecified external cause status: Secondary | ICD-10-CM | POA: Diagnosis not present

## 2017-01-13 DIAGNOSIS — W57XXXA Bitten or stung by nonvenomous insect and other nonvenomous arthropods, initial encounter: Secondary | ICD-10-CM | POA: Insufficient documentation

## 2017-01-13 DIAGNOSIS — Z87891 Personal history of nicotine dependence: Secondary | ICD-10-CM | POA: Insufficient documentation

## 2017-01-13 DIAGNOSIS — Y939 Activity, unspecified: Secondary | ICD-10-CM | POA: Insufficient documentation

## 2017-01-13 DIAGNOSIS — Y929 Unspecified place or not applicable: Secondary | ICD-10-CM | POA: Diagnosis not present

## 2017-01-13 DIAGNOSIS — S70362A Insect bite (nonvenomous), left thigh, initial encounter: Secondary | ICD-10-CM | POA: Diagnosis not present

## 2017-01-13 MED ORDER — LIDOCAINE-EPINEPHRINE (PF) 2 %-1:200000 IJ SOLN
10.0000 mL | Freq: Once | INTRAMUSCULAR | Status: AC
  Administered 2017-01-13: 10 mL
  Filled 2017-01-13: qty 20

## 2017-01-13 MED ORDER — OXYCODONE-ACETAMINOPHEN 5-325 MG PO TABS
1.0000 | ORAL_TABLET | Freq: Once | ORAL | Status: AC
  Administered 2017-01-13: 1 via ORAL
  Filled 2017-01-13: qty 1

## 2017-01-13 MED ORDER — OXYCODONE-ACETAMINOPHEN 5-325 MG PO TABS: 2.0000 | ORAL_TABLET | Freq: Three times a day (TID) | ORAL | 0 refills | Status: DC | PRN

## 2017-01-13 MED ORDER — DOXYCYCLINE HYCLATE 100 MG PO CAPS: 100.0000 mg | ORAL_CAPSULE | Freq: Two times a day (BID) | ORAL | 0 refills | Status: AC

## 2017-01-13 NOTE — ED Notes (Signed)
See EDP secondary assessment.  

## 2017-01-13 NOTE — Discharge Instructions (Signed)
Take doxycycline twice daily for 2 weeks. Return to ED for worsening pain, fever, no improvement in rash, vomiting, additional tick bites.

## 2017-01-13 NOTE — ED Triage Notes (Signed)
Pt st's he has a tick embedded in his left thigh.  St's first noticed 3 days ago but could not get it out

## 2017-01-13 NOTE — ED Provider Notes (Signed)
[facility]-EMERGENCY DEPT Provider Note   CSN: [id number] Arrival date & time: 01/13/17  1724   By signing my name below, I, Clarisse Gouge, attest that this documentation has been prepared under the direction and in the presence of Aliah Eriksson, PA-C. Electronically Signed: Clarisse Gouge, Scribe. 01/13/17. 6:36 PM.   History   Chief Complaint Chief Complaint  Patient presents with  . Tick Removal   The history is provided by the patient and medical records. No language interpreter was used.    Steven Montoya is a 41 y.o. male who presents to the Emergency Department with concern for tick bite on anterior upper thigh with associated pain onset gradually over the past 3 days. Diaphoresis worse at night, nausea, chills noted. He states he pulled a tick from the affected area 3 days ago with his hands; his significant other believes it was not completely removed. She adds another tick bite, completely removed near the site of the aforementioned area. He describes moderate, throbbing, constant pain worse with contact. Patient states the tick exposure may have happened ~7-10 days ago, but he did not notice the area until 3 days ago. Pt ambulatory. Tylenol has not provided relief to pain. No other modifying factors noted. No fevers or vomiting noted. No h/o tick bites noted. No other complaints at this time.   Past Medical History:  Diagnosis Date  . Chronic urethral stricture   . Diverticulitis   . Frequency of urination   . Headache(784.0)   . Lower abdominal pain   . Mitral valve prolapse   . Nocturia   . Urgency of urination   . Urinary straining     Patient Active Problem List   Diagnosis Date Noted  . MVP (mitral valve prolapse) 10/02/2016  . Non-rheumatic mitral regurgitation 10/02/2016  . Smoker 10/02/2016  . 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 Depressive disorder 12/31/2015  . Rectal bleeding 07/25/2013    Past Surgical History:    Procedure Laterality Date  . COLONOSCOPY N/A 08/17/2013   Procedure: COLONOSCOPY;  Surgeon: Romie Levee, MD;  Location: [facility];  Service: Endoscopy;  Laterality: N/A;  . CYSTO/ RETROGRADE PYELOGRAM/ URETHRAL DILATION  11-05-2010  . TOOTH EXTRACTION     canines       Home Medications    Prior to Admission medications   Medication Sig Start Date End Date Taking? Authorizing Provider  doxycycline (VIBRAMYCIN) 100 MG capsule Take 1 capsule (100 mg total) by mouth 2 (two) times daily. 01/13/17 01/27/17  Kaimana Lurz, Hillary Bow, PA-C  oxyCODONE-acetaminophen (PERCOCET/ROXICET) 5-325 MG tablet Take 2 tablets by mouth every 8 (eight) hours as needed for severe pain. 01/13/17   Dietrich Pates, PA-C    Family History Family History  Problem Relation Age of Onset  . Heart disease Father     Social History Social History  Substance Use Topics  . Smoking status: Former Smoker    Packs/day: 0.50    Years: 4.00    Types: Cigarettes  . Smokeless tobacco: Current User    Types: Chew     Comment: not chewed tobacco in 3 months  . Alcohol use No     Comment: Using for 25 years     Allergies   Latex and Adhesive [tape]   Review of Systems Review of Systems  Constitutional: Positive for chills and diaphoresis. Negative for fever.  Gastrointestinal: Positive for nausea. Negative for vomiting.  Skin: Positive for color change, rash and wound.  All other systems  reviewed and are negative.    Physical Exam Updated Vital Signs BP (!) 146/87 (BP Location: Right Arm)   Pulse 77   Temp 98.2 F (36.8 C) (Oral)   Resp 16   Ht 6' (1.829 m)   Wt 160 lb (72.6 kg)   SpO2 99%   BMI 21.70 kg/m   Physical Exam  Constitutional: He appears well-developed and well-nourished. No distress.  HENT:  Head: Normocephalic and atraumatic.  Eyes: Conjunctivae and EOM are normal. No scleral icterus.  Neck: Normal range of motion.  Pulmonary/Chest: Effort normal. No respiratory distress.   Neurological: He is alert.  Skin: Rash noted. He is not diaphoretic. There is erythema.     Psychiatric: He has a normal mood and affect.  Nursing note and vitals reviewed.    ED Treatments / Results  DIAGNOSTIC STUDIES: Oxygen Saturation is 99% on RA, NL by my interpretation.    COORDINATION OF CARE: 6:30 PM-Discussed next steps with pt. Pt verbalized understanding and is agreeable with the plan. Will consult attending and prepare pt for tick removal.   Labs (all labs ordered are listed, but only abnormal results are displayed) Labs Reviewed - No data to display  EKG  EKG Interpretation None       Radiology No results found.  Procedures Procedures (including critical care time)  Medications Ordered in ED Medications  lidocaine-EPINEPHrine (XYLOCAINE W/EPI) 2 %-1:200000 (PF) injection 10 mL (10 mLs Infiltration Given by Other 01/13/17 1921)  oxyCODONE-acetaminophen (PERCOCET/ROXICET) 5-325 MG per tablet 1 tablet (1 tablet Oral Given 01/13/17 1921)     Initial Impression / Assessment and Plan / ED Course  I have reviewed the triage vital signs and the nursing notes.  Pertinent labs & imaging results that were available during my care of the patient were reviewed by me and considered in my medical decision making (see chart for details).     Patient presents with history of tick bite with incomplete removal of tick. There does appear to be an area in the middle of the small area of erythema on the thigh that could be the remnants of a tick. Area was anesthetized and remainder of tick dislodged with 11 blade scalpel. Due to patient's report of nausea, chills, with treat empirically for RMSF and/or Lyme disease due to prevalence and importance of prophylaxis. Advised patient and SO that we do not complete Lyme or RMSF testing here in the ED. Given doxycycline to be taken as directed for 14 days. Patient is not experiencing any other symptoms at this time. He appears stable  for discharge with no evidence of airway compromise or respiratory distress. Strict return precautions given.  Final Clinical Impressions(s) / ED Diagnoses   Final diagnoses:  Tick bite with subsequent removal of tick    New Prescriptions Discharge Medication List as of 01/13/2017  7:58 PM    START taking these medications   Details  doxycycline (VIBRAMYCIN) 100 MG capsule Take 1 capsule (100 mg total) by mouth 2 (two) times daily., Starting Thu 01/13/2017, Until Thu 01/27/2017, Print    oxyCODONE-acetaminophen (PERCOCET/ROXICET) 5-325 MG tablet Take 2 tablets by mouth every 8 (eight) hours as needed for severe pain., Starting Thu 01/13/2017, Print       I personally performed the services described in this documentation, which was scribed in my presence. The recorded information has been reviewed and is accurate.    Dietrich PatesKhatri, Aleida Crandell, PA-C 01/14/17 1251    Tilden Fossaees, Elizabeth, MD 01/14/17 1452

## 2017-03-24 ENCOUNTER — Emergency Department (HOSPITAL_COMMUNITY)
Admission: EM | Admit: 2017-03-24 | Discharge: 2017-03-24 | Disposition: A | Discharge status: Home / Self Care | Payer: Medicaid Other | Attending: Emergency Medicine | Admitting: Emergency Medicine

## 2017-03-24 ENCOUNTER — Emergency Department (HOSPITAL_COMMUNITY): Payer: Medicaid Other

## 2017-03-24 ENCOUNTER — Encounter (HOSPITAL_COMMUNITY): Payer: Self-pay

## 2017-03-24 DIAGNOSIS — R0789 Other chest pain: Secondary | ICD-10-CM | POA: Insufficient documentation

## 2017-03-24 DIAGNOSIS — M79641 Pain in right hand: Secondary | ICD-10-CM | POA: Diagnosis not present

## 2017-03-24 DIAGNOSIS — S50312A Abrasion of left elbow, initial encounter: Secondary | ICD-10-CM | POA: Diagnosis not present

## 2017-03-24 DIAGNOSIS — Y92524 Gas station as the place of occurrence of the external cause: Secondary | ICD-10-CM | POA: Diagnosis not present

## 2017-03-24 DIAGNOSIS — M545 Low back pain: Secondary | ICD-10-CM | POA: Insufficient documentation

## 2017-03-24 DIAGNOSIS — S6991XA Unspecified injury of right wrist, hand and finger(s), initial encounter: Secondary | ICD-10-CM

## 2017-03-24 DIAGNOSIS — Y999 Unspecified external cause status: Secondary | ICD-10-CM | POA: Insufficient documentation

## 2017-03-24 DIAGNOSIS — Y9389 Activity, other specified: Secondary | ICD-10-CM | POA: Insufficient documentation

## 2017-03-24 DIAGNOSIS — W503XXA Accidental bite by another person, initial encounter: Secondary | ICD-10-CM

## 2017-03-24 MED ORDER — OXYCODONE-ACETAMINOPHEN 5-325 MG PO TABS
1.0000 | ORAL_TABLET | Freq: Once | ORAL | Status: AC
  Administered 2017-03-24: 1 via ORAL
  Filled 2017-03-24: qty 1

## 2017-03-24 MED ORDER — NAPROXEN 500 MG PO TABS: 500.0000 mg | ORAL_TABLET | Freq: Two times a day (BID) | ORAL | 0 refills | Status: DC

## 2017-03-24 MED ORDER — AMOXICILLIN-POT CLAVULANATE 875-125 MG PO TABS: 1.0000 | ORAL_TABLET | Freq: Two times a day (BID) | ORAL | 0 refills | Status: DC
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
## 2017-05-09 ENCOUNTER — Ambulatory Visit: Payer: Self-pay | Admitting: Nurse Practitioner
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
## 2017-06-06 ENCOUNTER — Encounter: Payer: Self-pay | Admitting: Nurse Practitioner

## 2017-06-06 ENCOUNTER — Encounter (INDEPENDENT_AMBULATORY_CARE_PROVIDER_SITE_OTHER): Payer: Self-pay

## 2017-06-06 ENCOUNTER — Ambulatory Visit (INDEPENDENT_AMBULATORY_CARE_PROVIDER_SITE_OTHER): Payer: Medicaid Other | Admitting: Nurse Practitioner

## 2017-06-06 VITALS — BP 120/80 | HR 84 | Ht 72.0 in | Wt 147.0 lb

## 2017-06-06 DIAGNOSIS — I341 Nonrheumatic mitral (valve) prolapse: Secondary | ICD-10-CM

## 2017-06-06 DIAGNOSIS — R011 Cardiac murmur, unspecified: Secondary | ICD-10-CM | POA: Diagnosis not present

## 2017-06-06 NOTE — Progress Notes (Signed)
CARDIOLOGY OFFICE NOTE  Date:  06/06/2017    Steven Montoya Date of Birth: Mar 21, 1976 Medical Record #[id number]  PCP:  Patient, No Pcp Per  Cardiologist:  Anne Fu  Chief Complaint  Patient presents with  . Cardiac Valve Problem    Seen for Dr. Anne Fu    History of Present Illness: Steven Montoya is a 41 y.o. male who presents today for a follow up visit. Seen for Dr. Anne Fu.   He has a history of mitral valve prolapse and moderate mitral regurgitation. Initially seen here back in February of 2018 - noted that during entrance into [facility] facility, MD noted loud murmur. ECHO ordered and showed MVP with moderate MR, normal EF and mild LAE. LV noted to be dilated. Patient was not SOB, no syncope, no diaphoresis. No recent fevers. He has a history of drug abuse and ETOH use. He smokes. No prior history of endocarditis.   He was felt to be doing well at his last visit here. Echo was ordered - does not look like it was completed.    Comes in today. Here with his wife. On no medicines. Does not have a PCP. Has lost weight - seems like when he is stressed - he stops eating. Weight is down almost 15 pounds since last visit here. Asking for sleeping medicines and anxiety pills. Says he feels weak - does not sleep well. Has some shortness of breath - happens at night - has sort of always done that. Has some chest pain with lying down - has to turn over or move to make it feel better. Still smoking. Very uneasy about the need for possible surgery given that his father died after MV surgery (in [place]).   Past Medical History:  Diagnosis Date  . Chronic urethral stricture   . Diverticulitis   . Frequency of urination   . Headache(784.0)   . Lower abdominal pain   . Mitral valve prolapse   . Nocturia   . Urgency of urination   . Urinary straining     Past Surgical History:  Procedure Laterality Date  . COLONOSCOPY N/A 08/17/2013   Procedure: COLONOSCOPY;  Surgeon: Romie Levee, MD;  Location: [facility];  Service: Endoscopy;  Laterality: N/A;  . CYSTO/ RETROGRADE PYELOGRAM/ URETHRAL DILATION  11-05-2010  . TOOTH EXTRACTION     canines     Medications: No outpatient prescriptions have been marked as taking for the 06/06/17 encounter (Office Visit) with Rosalio Macadamia, NP.     Allergies: Allergies  Allergen Reactions  . Latex Other (See Comments)    HX SUPERFICIAL THROMBOPHLEBITIS LEFT FOREARM AFTER IV IN APR 2011  . Adhesive [Tape] Rash    Social History: The patient  reports that he has been smoking Cigarettes.  He has a 2.00 pack-year smoking history. His smokeless tobacco use includes Chew and Snuff. He reports that he does not drink alcohol or use drugs.   Family History: The patient's family history includes Heart disease in his father.   Review of Systems: Please see the history of present illness.   Otherwise, the review of systems is positive for none.   All other systems are reviewed and negative.   Physical Exam: VS:  BP 120/80 (BP Location: Left Arm, Patient Position: Sitting, Cuff Size: Normal)   Pulse 84   Ht 6' (1.829 m)   Wt 147 lb (66.7 kg)   BMI 19.94 kg/m  .  BMI Body mass index  is 19.94 kg/m.  Wt Readings from Last 3 Encounters:  06/06/17 147 lb (66.7 kg)  01/13/17 160 lb (72.6 kg)  10/01/16 160 lb 12.8 oz (72.9 kg)    General: Looks older than his stated age. Alert and in no acute distress.  Smells heavily of tobacco. Has lost weight. Thin.  HEENT: Normal.  Neck: Supple, no JVD, carotid bruits, or masses noted.  Cardiac: Regular rate and rhythm. Loud systolic murmur - especially at the apex. No edema.  Respiratory:  Lungs are coarse but with normal work of breathing.  GI: Soft and nontender.  MS: No deformity or atrophy. Gait and ROM intact.  Skin: Warm and dry. Color is normal.  Neuro:  Strength and sensation are intact and no gross focal deficits noted.  Psych: Alert, appropriate and with normal  affect.   LABORATORY DATA:  EKG:  EKG is ordered today. This shows NSR - nonspecific ST and T wave changes - unchanged from prior tracing.   Lab Results  Component Value Date   WBC 12.2 (H) 12/31/2015   HGB 14.9 12/31/2015   HCT 42.2 12/31/2015   PLT 279 12/31/2015   GLUCOSE 133 (H) 12/31/2015   ALT 15 (L) 12/31/2015   AST 22 12/31/2015   NA 141 12/31/2015   K 3.9 12/31/2015   CL 107 12/31/2015   CREATININE 0.91 12/31/2015   BUN 15 12/31/2015   CO2 24 12/31/2015     BNP (last 3 results) No results for input(s): BNP in the last 8760 hours.  ProBNP (last 3 results) No results for input(s): PROBNP in the last 8760 hours.   Other Studies Reviewed Today:   Assessment/Plan:  1. Moderate MV Regurgitation with moderate posterior mitral valve prolapse.   - no signs of heart failure or fluid overload but complaining more of shortness of breath, atypical chest pain and generalized weakness - will get his echo updated as Dr. Anne Fu had recommended at last visit. May need TEE. Further disposition to follow.   2. Family history of MV disease  - father died after surgery - he is pretty hesitant about having to have surgery himself. No real details noted.   3. Smoker  - encouraged cessation. He is not ready.   4. History of polysubstance abuse  - no history of endocarditis. Denies alcohol and drugs at this time.   5. Weight loss - suspect this is more related to his anxiety - but needs labs today. Suggested establishing at [facility] for primary care needs.    Current medicines are reviewed with the patient today.  The patient does not have concerns regarding medicines other than what has been noted above.  The following changes have been made:  See above.  Labs/ tests ordered today include:    Orders Placed This Encounter  Procedures  . Basic metabolic panel  . CBC  . Hepatic function panel  . TSH  . EKG 12-Lead  . ECHOCARDIOGRAM COMPLETE      Disposition:   Further disposition to follow.   Patient is agreeable to this plan and will call if any problems develop in the interim.   SignedNorma Fredrickson, NP  06/06/2017 3:38 PM  [facility] [address] [place], [place]  [address] Phone: [phone] Fax: [phone]

## 2017-06-06 NOTE — Patient Instructions (Addendum)
We will be checking the following labs today - BMET, CBC, HPF and TSH   Medication Instructions:    N/A    Testing/Procedures To Be Arranged:  Echocardiogram  Follow-Up:   Will see how your echo turns out and then decide about your follow up.     Other Special Instructions:   N/A    If you need a refill on your cardiac medications before your next appointment, please call your pharmacy.   Call the [facility] office at [phone] if you have any questions, problems or concerns.

## 2017-06-07 LAB — HEPATIC FUNCTION PANEL
ALT: 16 IU/L (ref 0–44)
AST: 17 IU/L (ref 0–40)
Albumin: 4.6 g/dL (ref 3.5–5.5)
Alkaline Phosphatase: 57 IU/L (ref 39–117)
Bilirubin Total: 0.6 mg/dL (ref 0.0–1.2)
Bilirubin, Direct: 0.16 mg/dL (ref 0.00–0.40)
Total Protein: 6.7 g/dL (ref 6.0–8.5)

## 2017-06-07 LAB — CBC
Hematocrit: 43.6 % (ref 37.5–51.0)
Hemoglobin: 14.6 g/dL (ref 13.0–17.7)
MCH: 30.5 pg (ref 26.6–33.0)
MCHC: 33.5 g/dL (ref 31.5–35.7)
MCV: 91 fL (ref 79–97)
Platelets: 298 10*3/uL (ref 150–379)
RBC: 4.78 x10E6/uL (ref 4.14–5.80)
RDW: 14.3 % (ref 12.3–15.4)
WBC: 7 10*3/uL (ref 3.4–10.8)

## 2017-06-07 LAB — BASIC METABOLIC PANEL
BUN/Creatinine Ratio: 16 (ref 9–20)
BUN: 13 mg/dL (ref 6–24)
CO2: 22 mmol/L (ref 20–29)
Calcium: 10.1 mg/dL (ref 8.7–10.2)
Chloride: 103 mmol/L (ref 96–106)
Creatinine, Ser: 0.82 mg/dL (ref 0.76–1.27)
GFR calc Af Amer: 128 mL/min/{1.73_m2} (ref >59)
GFR calc non Af Amer: 111 mL/min/{1.73_m2} (ref >59)
Glucose: 100 mg/dL — ABNORMAL HIGH (ref 65–99)
Potassium: 4.9 mmol/L (ref 3.5–5.2)
Sodium: 141 mmol/L (ref 134–144)

## 2017-06-07 LAB — TSH: TSH: 0.632 u[IU]/mL (ref 0.450–4.500)

## 2017-06-08 ENCOUNTER — Ambulatory Visit (HOSPITAL_COMMUNITY): Payer: Medicaid Other | Attending: Cardiology

## 2017-06-08 ENCOUNTER — Other Ambulatory Visit: Payer: Self-pay

## 2017-06-08 ENCOUNTER — Telehealth: Payer: Self-pay | Admitting: Cardiology

## 2017-06-08 DIAGNOSIS — R011 Cardiac murmur, unspecified: Secondary | ICD-10-CM

## 2017-06-08 DIAGNOSIS — Z720 Tobacco use: Secondary | ICD-10-CM | POA: Insufficient documentation

## 2017-06-08 DIAGNOSIS — I341 Nonrheumatic mitral (valve) prolapse: Secondary | ICD-10-CM | POA: Diagnosis not present

## 2017-06-08 DIAGNOSIS — I340 Nonrheumatic mitral (valve) insufficiency: Secondary | ICD-10-CM | POA: Diagnosis not present

## 2017-06-08 NOTE — Telephone Encounter (Signed)
Made Chrystine Oiler, patient's mother, that she is not on the [facility] and therefore I cannot speak to her regarding her son.

## 2017-06-08 NOTE — Telephone Encounter (Signed)
New Message  Pt mother call requesting to speak with RN about pts diagnosis. Pt mother states pt doesn't speak fully of symptoms he is having and she would like to discuss further with RN .please call back to discuss

## 2017-06-10 ENCOUNTER — Encounter (HOSPITAL_COMMUNITY): Payer: Self-pay | Admitting: Emergency Medicine

## 2017-06-10 ENCOUNTER — Emergency Department (HOSPITAL_COMMUNITY): Payer: Medicaid Other

## 2017-06-10 DIAGNOSIS — Z5321 Procedure and treatment not carried out due to patient leaving prior to being seen by health care provider: Secondary | ICD-10-CM | POA: Insufficient documentation

## 2017-06-10 DIAGNOSIS — R079 Chest pain, unspecified: Secondary | ICD-10-CM | POA: Insufficient documentation

## 2017-06-10 DIAGNOSIS — R55 Syncope and collapse: Secondary | ICD-10-CM | POA: Diagnosis not present

## 2017-06-10 LAB — URINALYSIS, ROUTINE W REFLEX MICROSCOPIC
BILIRUBIN URINE: NEGATIVE
Glucose, UA: NEGATIVE mg/dL
HGB URINE DIPSTICK: NEGATIVE
KETONES UR: 5 mg/dL — AB
LEUKOCYTES UA: NEGATIVE
Nitrite: NEGATIVE
PH: 6 (ref 5.0–8.0)
Protein, ur: 100 mg/dL — AB
Specific Gravity, Urine: 1.026 (ref 1.005–1.030)

## 2017-06-10 LAB — BASIC METABOLIC PANEL
ANION GAP: 9 (ref 5–15)
BUN: 10 mg/dL (ref 6–20)
CO2: 27 mmol/L (ref 22–32)
Calcium: 9.6 mg/dL (ref 8.9–10.3)
Chloride: 102 mmol/L (ref 101–111)
Creatinine, Ser: 0.96 mg/dL (ref 0.61–1.24)
GFR calc Af Amer: >60 mL/min (ref >60)
GFR calc non Af Amer: >60 mL/min (ref >60)
GLUCOSE: 79 mg/dL (ref 65–99)
POTASSIUM: 4.1 mmol/L (ref 3.5–5.1)
Sodium: 138 mmol/L (ref 135–145)

## 2017-06-10 LAB — CBC
HEMATOCRIT: 40.8 % (ref 39.0–52.0)
HEMOGLOBIN: 14.4 g/dL (ref 13.0–17.0)
MCH: 32.1 pg (ref 26.0–34.0)
MCHC: 35.3 g/dL (ref 30.0–36.0)
MCV: 90.9 fL (ref 78.0–100.0)
Platelets: 307 10*3/uL (ref 150–400)
RBC: 4.49 MIL/uL (ref 4.22–5.81)
RDW: 14.3 % (ref 11.5–15.5)
WBC: 9.6 10*3/uL (ref 4.0–10.5)

## 2017-06-10 LAB — I-STAT TROPONIN, ED: Troponin i, poc: 0 ng/mL (ref 0.00–0.08)

## 2017-06-10 NOTE — ED Notes (Signed)
Did not answer name when called X3

## 2017-06-10 NOTE — ED Triage Notes (Signed)
Patient was in normal health today then started feeling bad.  Family states that he then passed out and started having chest pain, shortness of breath and headache.  Patient awakes in triage, not knowing where he is.  He states that he hit his head on the shower.

## 2017-06-11 ENCOUNTER — Emergency Department (HOSPITAL_COMMUNITY)
Admission: EM | Admit: 2017-06-11 | Discharge: 2017-06-11 | Disposition: A | Discharge status: Home / Self Care | Payer: Medicaid Other | Attending: Emergency Medicine | Admitting: Emergency Medicine

## 2017-06-11 NOTE — ED Notes (Signed)
Attempted to call pt x2 did not respond. Will attempt one more time.

## 2017-06-13 ENCOUNTER — Telehealth: Payer: Self-pay | Admitting: Nurse Practitioner

## 2017-06-13 NOTE — Telephone Encounter (Signed)
New message     Pt needs to know echo results

## 2017-06-21 NOTE — Progress Notes (Signed)
CARDIOLOGY OFFICE NOTE  Date:  06/22/2017    Steven Montoya Date of Birth: [birth year] Medical Record #[id number]  PCP:  Patient, No Pcp Per  Cardiologist:  [facility]   Chief Complaint  Patient presents with  . Cardiac Valve Problem    Post echo visit - seen for Dr. Anne FuSkains    History of Present Illness: Steven GuyDonnie R Yusuf is a 41 y.o. male who presents today for a discussion for TEE. Seen for Dr. Anne FuSkains.   He has a history of mitral valve prolapse and moderate mitral regurgitation. Initially seen here back in February of 2018 - noted that [facility], MD noted loud murmur. ECHO ordered and showed MVP with moderate MR, normal EF and mild LAE. LV noted to be dilated. Patient was not SOB, no syncope, no diaphoresis. No recent fevers. He has a history of drug abuse and ETOH use. He smokes. No prior history of endocarditis.   He was felt to be doing well at his visit here from February of 2018. Echo was ordered - does not look like it was completed.   I then saw him earlier this month - no PCP, lots of complaints -  losing weight. Anxious, weak, short of breath, chest pain with lying down. Still smoking. Got his echo updated - see below - reviewed with Dr. Anne FuSkains. TEE has been recommended.   He has been to the ER for chest pain since his last visit here - did not stay to be seen.   Comes in today. Here with his wife. He says he "could be doing better". Still with generalized weakness. Short of breath at times. Continues to smoke - but trying to smoke less. This spell that led to the ER visit is described as he got acutely ill with being lightheaded - started throwing up - then "passed out" and hit the tub. Wife took him to the ER - they both say he was "blown off and treated like a junkie" - so they left. This has not recurred. He has no current drug use other than smoking.   Past Medical History:  Diagnosis Date  . Chronic urethral  stricture   . Diverticulitis   . Frequency of urination   . Headache(784.0)   . Lower abdominal pain   . Mitral valve prolapse   . Nocturia   . Urgency of urination   . Urinary straining     Past Surgical History:  Procedure Laterality Date  . COLONOSCOPY N/A 08/17/2013   Procedure: COLONOSCOPY;  Surgeon: Romie LeveeAlicia Thomas, MD;  Location: [facility];  Service: Endoscopy;  Laterality: N/A;  . CYSTO/ RETROGRADE PYELOGRAM/ URETHRAL DILATION  11-05-2010  . TOOTH EXTRACTION     canines     Medications: No outpatient prescriptions have been marked as taking for the 06/22/17 encounter (Office Visit) with Rosalio MacadamiaGerhardt, Gwendy Boeder C, NP.     Allergies: Allergies  Allergen Reactions  . Latex Other (See Comments)    HX SUPERFICIAL THROMBOPHLEBITIS LEFT FOREARM AFTER IV IN APR 2011  . Adhesive [Tape] Rash    Social History: The patient  reports that he has been smoking Cigarettes.  He has a 2.00 pack-year smoking history. His smokeless tobacco use includes Chew and Snuff. He reports that he does not drink alcohol or use drugs.   Family History: The patient's family history includes Heart disease in his father.   Review of Systems: Please see the history of present illness.  Otherwise, the review of systems is positive for none.   All other systems are reviewed and negative.   Physical Exam: VS:  BP 138/90 (BP Location: Left Arm, Patient Position: Sitting)   Pulse 72   Wt 149 lb (67.6 kg)   BMI 20.21 kg/m  .  BMI Body mass index is 20.21 kg/m.  Wt Readings from Last 3 Encounters:  06/22/17 149 lb (67.6 kg)  06/06/17 147 lb (66.7 kg)  01/13/17 160 lb (72.6 kg)    General: Quite thin. Ruddy complexion. Smells of tobacco. Multiple tattoos.  Alert and in no acute distress.   HEENT: Normal.  Neck: Supple, no JVD, carotid bruits, or masses noted.  Cardiac: Regular rate and rhythm. Loud holosystolic murmur. No edema.  Respiratory:  Lungs are clear to auscultation bilaterally with normal  work of breathing.  GI: Soft and nontender.  MS: No deformity or atrophy. Gait and ROM intact.  Skin: Warm and dry. Color is normal.  Neuro:  Strength and sensation are intact and no gross focal deficits noted.  Psych: Alert, appropriate and with normal affect.   LABORATORY DATA:  EKG:  EKG is not ordered today.  Lab Results  Component Value Date   WBC 9.6 06/10/2017   HGB 14.4 06/10/2017   HCT 40.8 06/10/2017   PLT 307 06/10/2017   GLUCOSE 79 06/10/2017   ALT 16 06/06/2017   AST 17 06/06/2017   NA 138 06/10/2017   K 4.1 06/10/2017   CL 102 06/10/2017   CREATININE 0.96 06/10/2017   BUN 10 06/10/2017   CO2 27 06/10/2017   TSH 0.632 06/06/2017     BNP (last 3 results) No results for input(s): BNP in the last 8760 hours.  ProBNP (last 3 results) No results for input(s): PROBNP in the last 8760 hours.   Other Studies Reviewed Today:  Echo Study Conclusions 05/2017  - Left ventricle: The cavity size was normal. Wall thickness was   increased in a pattern of mild LVH. Systolic function was normal.   The estimated ejection fraction was in the range of 60% to 65%.   Wall motion was normal; there were no regional wall motion   abnormalities. Left ventricular diastolic function parameters   were normal. - Mitral valve: Mild posterior leaflet prolapse. Mildly thickened   leaflets . There was moderate eccentric regurgitation. - Left atrium: The atrium was mildly dilated.  Impressions:  - Consider TEE for a more comprehensive assessment of mitral   regurgitant jet.   Notes recorded by Jake Bathe, MD on 06/13/2017 at 1:35 PM EDT Spoke to [facility], okay to schedule TEE with me. Personally reviewed echocardiogram, moderate to severe mitral valve prolapse posterior leaflet with eccentric jet wrapping around left atrium which is mildly dilated. Regurgitation appears at least moderate could be severe. Donato Schultz, MD   Assessment/Plan:  1. Moderate MV Regurgitation  with moderate posterior mitral valve prolapse. - no signs of heart failure or fluid overload but complaining more of shortness of breath, atypical chest pain and generalized weakness and now possible syncope - have reviewed most recent echo with Dr. Anne Fu - TEE has been advised - will arrange with Dr. Anne Fu. The procedure, risks of perforation/aspiration/oversedation and the benefits have been discussed and he is anxious to proceed. Arranged with Dr. Anne Fu on October 30th. May need referral to Dr. Cornelius Moras for consideration of minimally invasive MV repair. Further disposition to follow.   2. Family history of MV disease - father died after surgery (  this was with open sternotomy) - he is pretty hesitant about having to have surgery himself.   3. Smoker - encouraged cessation. He is not ready. Advised again to try and keep cutting back.   4. History of polysubstance abuse - no history of endocarditis. Denies alcohol and drugs at this time.   5. Weight loss - suspected this is more related to his anxiety - his recent labs were ok. Suggested again to establish at [facility] for primary care needs.   Current medicines are reviewed with the patient today.  The patient does not have concerns regarding medicines other than what has been noted above.  The following changes have been made:  See above.  Labs/ tests ordered today include:   No orders of the defined types were placed in this encounter.    Disposition:   Further disposition to follow.   Patient is agreeable to this plan and will call if any problems develop in the interim.   SignedNorma Fredrickson, NP  06/22/2017 10:29 AM  [facility] [address] [place], [place]  [address] Phone: [phone] Fax: [phone]

## 2017-06-22 ENCOUNTER — Encounter: Payer: Self-pay | Admitting: Nurse Practitioner

## 2017-06-22 ENCOUNTER — Ambulatory Visit (INDEPENDENT_AMBULATORY_CARE_PROVIDER_SITE_OTHER): Payer: Medicaid Other | Admitting: Nurse Practitioner

## 2017-06-22 VITALS — BP 138/90 | HR 72 | Wt 149.0 lb

## 2017-06-22 DIAGNOSIS — R011 Cardiac murmur, unspecified: Secondary | ICD-10-CM | POA: Diagnosis not present

## 2017-06-22 DIAGNOSIS — I341 Nonrheumatic mitral (valve) prolapse: Secondary | ICD-10-CM | POA: Diagnosis not present

## 2017-06-22 DIAGNOSIS — F17200 Nicotine dependence, unspecified, uncomplicated: Secondary | ICD-10-CM | POA: Diagnosis not present

## 2017-06-22 NOTE — Patient Instructions (Signed)
We will be checking the following labs today - NONE   Medication Instructions:    Continue with your current medicines.     Testing/Procedures To Be Arranged:  TEE with Dr. Anne FuSkains to look at your heart valve better.    Other Special Instructions:   Your provider has recommended a TEE - transesophageal echocardiogram.  You are scheduled for your TEE on Tuesday, October 30th at [facility] with Dr. Anne FuSkains or associates. Please go to [facility] 2[facility] Floor Short Stay at Tuesday, October 30th at 6 AM.  Enter through the [facility] Entrance A Do not have any food or drink after midnight on Monday.  You may take your medicines with a sip of water on the day of your procedure.  You will need someone to drive you home following your procedure.    Call the [facility] office at [phone] if you have any questions, problems or concerns.   Transesophageal Echocardiogram Transesophageal echocardiography (TEE) is a special type of test that produces images of the heart by using sound waves (echocardiogram). This type of echocardiography can obtain better images of the heart than standard echocardiography. TEE is done by passing a flexible tube down the esophagus. The heart is located in front of the esophagus. Because the heart and esophagus are close to one another, your health care provider can take very clear, detailed pictures of the heart via ultrasound waves. TEE may be done: If your health care provider needs more information based on standard echocardiography findings. If you had a stroke. This might have happened because a clot formed in your heart. TEE can visualize different areas of the heart and check for clots. To check valve anatomy and function. To check for infection on the inside of your heart (endocarditis). To evaluate the dividing wall (septum) of the heart and presence of a hole that did not close after birth (patent foramen ovale or atrial septal  defect). To help diagnose a tear in the wall of the aorta (aortic dissection). During cardiac valve surgery. This allows the surgeon to assess the valve repair before closing the chest. During a variety of other cardiac procedures to guide positioning of catheters. Sometimes before a cardioversion, which is a shock to convert heart rhythm back to normal.  LET [facility] KNOW ABOUT:  Any allergies you have. All medicines you are taking, including vitamins, herbs, eye drops, creams, and over-the-counter medicines. Previous problems you or members of your family have had with the use of anesthetics. Any blood disorders you have. Previous surgeries you have had. Medical conditions you have. Swallowing difficulties. An esophageal obstruction.  RISKS AND COMPLICATIONS  Generally, TEE is a safe procedure. However, as with any procedure, complications can occur. Possible complications include an esophageal tear (rupture), perforation, aspiration and/or oversedation. You may have a sore throat following this procedure.  BEFORE THE PROCEDURE  Do not eat or drink for 6 hours before the procedure or as directed by your health care provider. Arrange for someone to drive you home after the procedure. Do not drive yourself home. During the procedure, you will be given medicines that can continue to make you feel drowsy and can impair your reflexes. An IV access tube will be started in the arm.  PROCEDURE  A medicine to help you relax (sedative) will be given through the IV access tube. A medicine may be sprayed or gargled to numb the back of the throat. Your blood pressure, heart  rate, and breathing (vital signs) will be monitored during the procedure. The TEE probe is a Hitzeman, flexible tube. The tip of the probe is placed into the back of the mouth, and you will be asked to swallow. This helps to pass the tip of the probe into the esophagus. Once the tip of the probe is in the correct  area, your health care provider can take pictures of the heart. TEE is usually not a painful procedure. You may feel the probe press against the back of the throat. The probe does not enter the trachea and does not affect your breathing.  AFTER THE PROCEDURE  You will be in bed, resting, until you have fully returned to consciousness. When you first awaken, your throat may feel slightly sore and will probably still feel numb. This will improve slowly over time. You will not be allowed to eat or drink until it is clear that the numbness has improved. Once you have been able to drink, urinate, and sit on the edge of the bed without feeling sick to your stomach (nausea) or dizzy, you may be cleared to go home. You should have a friend or family member with you for the next 24 hours after your procedure.     If you need a refill on your cardiac medications before your next appointment, please call your pharmacy.   Call the [facility] office at [phone] if you have any questions, problems or concerns.

## 2017-06-24 NOTE — Telephone Encounter (Signed)
New message    Pt mother is calling asking for a call back. She wants to know what is going to happen now with pt. Please call.

## 2017-06-24 NOTE — Telephone Encounter (Signed)
Called and spoke to patient's mom Rhunette CroftMildred (DPR on file). She is wanting to know what the next steps are for her son. Made her aware that the patient is scheduled for TEE on 10/30 for further evaluation of his MV. Made mom aware depending on the results of the TEE that the patient may need to be referred to Dr. Cornelius Moraswen for consideration of minimally invasive MV repair. Patient's mom verbalized understanding and thanked me for the call.

## 2017-06-28 ENCOUNTER — Ambulatory Visit (HOSPITAL_COMMUNITY): Payer: Medicaid Other | Admitting: Certified Registered"

## 2017-06-28 ENCOUNTER — Telehealth: Payer: Self-pay | Admitting: Cardiology

## 2017-06-28 ENCOUNTER — Encounter (HOSPITAL_COMMUNITY)
Admission: RE | Disposition: A | Discharge status: Home / Self Care | Payer: Self-pay | Source: Ambulatory Visit | Attending: Cardiology

## 2017-06-28 ENCOUNTER — Encounter (HOSPITAL_COMMUNITY): Payer: Self-pay | Admitting: *Deleted

## 2017-06-28 ENCOUNTER — Ambulatory Visit (HOSPITAL_COMMUNITY)
Admission: RE | Admit: 2017-06-28 | Discharge: 2017-06-28 | Disposition: A | Discharge status: Home / Self Care | Payer: Medicaid Other | Source: Ambulatory Visit | Attending: Cardiology | Admitting: Cardiology

## 2017-06-28 ENCOUNTER — Ambulatory Visit (HOSPITAL_BASED_OUTPATIENT_CLINIC_OR_DEPARTMENT_OTHER): Payer: Medicaid Other

## 2017-06-28 DIAGNOSIS — R011 Cardiac murmur, unspecified: Secondary | ICD-10-CM | POA: Insufficient documentation

## 2017-06-28 DIAGNOSIS — Z8719 Personal history of other diseases of the digestive system: Secondary | ICD-10-CM | POA: Insufficient documentation

## 2017-06-28 DIAGNOSIS — Z888 Allergy status to other drugs, medicaments and biological substances status: Secondary | ICD-10-CM | POA: Diagnosis not present

## 2017-06-28 DIAGNOSIS — I340 Nonrheumatic mitral (valve) insufficiency: Secondary | ICD-10-CM | POA: Diagnosis not present

## 2017-06-28 DIAGNOSIS — F329 Major depressive disorder, single episode, unspecified: Secondary | ICD-10-CM | POA: Insufficient documentation

## 2017-06-28 DIAGNOSIS — Z91040 Latex allergy status: Secondary | ICD-10-CM | POA: Insufficient documentation

## 2017-06-28 DIAGNOSIS — F419 Anxiety disorder, unspecified: Secondary | ICD-10-CM | POA: Diagnosis not present

## 2017-06-28 DIAGNOSIS — R634 Abnormal weight loss: Secondary | ICD-10-CM | POA: Insufficient documentation

## 2017-06-28 DIAGNOSIS — Z8249 Family history of ischemic heart disease and other diseases of the circulatory system: Secondary | ICD-10-CM | POA: Insufficient documentation

## 2017-06-28 DIAGNOSIS — I341 Nonrheumatic mitral (valve) prolapse: Secondary | ICD-10-CM | POA: Diagnosis not present

## 2017-06-28 DIAGNOSIS — F17210 Nicotine dependence, cigarettes, uncomplicated: Secondary | ICD-10-CM | POA: Diagnosis not present

## 2017-06-28 HISTORY — PX: TEE WITHOUT CARDIOVERSION: SHX5443

## 2017-06-28 SURGERY — ECHOCARDIOGRAM, TRANSESOPHAGEAL
Anesthesia: Monitor Anesthesia Care

## 2017-06-28 MED ORDER — SODIUM CHLORIDE 0.9 % IV SOLN: INTRAVENOUS | Status: DC

## 2017-06-28 MED ORDER — PROPOFOL 500 MG/50ML IV EMUL
INTRAVENOUS | Status: DC | PRN
  Administered 2017-06-28: 100 ug/kg/min via INTRAVENOUS

## 2017-06-28 MED ORDER — LIDOCAINE 2% (20 MG/ML) 5 ML SYRINGE
INTRAMUSCULAR | Status: DC | PRN
  Administered 2017-06-28: 40 mg via INTRAVENOUS

## 2017-06-28 MED ORDER — PROPOFOL 10 MG/ML IV BOLUS
INTRAVENOUS | Status: DC | PRN
  Administered 2017-06-28: 20 mg via INTRAVENOUS
  Administered 2017-06-28: 10 mg via INTRAVENOUS
  Administered 2017-06-28 (×9): 20 mg via INTRAVENOUS

## 2017-06-28 MED ORDER — LACTATED RINGERS IV SOLN
INTRAVENOUS | Status: AC | PRN
  Administered 2017-06-28: 1000 mL via INTRAVENOUS

## 2017-06-28 MED ORDER — BUTAMBEN-TETRACAINE-BENZOCAINE 2-2-14 % EX AERO
INHALATION_SPRAY | CUTANEOUS | Status: DC | PRN
Start: 2017-06-28 — End: 2017-06-28
  Administered 2017-06-28: 2 via TOPICAL

## 2017-06-28 NOTE — CV Procedure (Signed)
    Transesophageal echocardiogram  Indications: Evaluate mitral regurgitation/mitral valve prolapse  Timeout performed.  Anesthesia present for propofol administration.  Findings: - Severe mitral regurgitation, eccentric jet, wrapping around left atrium with severe posterior leaflet mitral valve prolapse  -Normal left ventricular ejection fraction EF 65% -Mildly dilated left atrium, 41 mm on transthoracic echocardiogram - Negative bubble study, no evidence of shunt.  Findings have been discussed.  We will refer to Dr.Owen for surgical evaluation.  He will likely need cardiac catheterization prior to mitral valve repair if he is deemed a surgical candidate.  Steven SchultzMark Skains, MD

## 2017-06-28 NOTE — Anesthesia Postprocedure Evaluation (Signed)
Anesthesia Post Note  Patient: Bharath Bernstein.  Procedure(s) Performed: TRANSESOPHAGEAL ECHOCARDIOGRAM (TEE) (N/A )     Patient location during evaluation: Endoscopy Anesthesia Type: MAC Level of consciousness: awake, awake and alert and oriented Pain management: pain level controlled Vital Signs Assessment: post-procedure vital signs reviewed and stable Respiratory status: spontaneous breathing, nonlabored ventilation and respiratory function stable Cardiovascular status: blood pressure returned to baseline Anesthetic complications: no    Last Vitals:  Vitals:   06/28/17 0910 06/28/17 0917  BP:  122/77  Pulse: 74 66  Resp: 13 (!) 22  Temp:    SpO2: 100% 99%    Last Pain:  Vitals:   06/28/17 0858  TempSrc: Oral  PainSc: 9                  Deyton Ellenbecker COKER

## 2017-06-28 NOTE — Interval H&P Note (Signed)
History and Physical Interval Note:  06/28/2017 8:25 AM  Steven R Alton Jr.  has presented today for surgery, with the diagnosis of MYTRAL REGURGITATION AND PROLAPSE   The various methods of treatment have been discussed with the patient and family. After consideration of risks, benefits and other options for treatment, the patient has consented to  Procedure(s): TRANSESOPHAGEAL ECHOCARDIOGRAM (TEE) (N/A) as a surgical intervention .  The patient's history has been reviewed, patient examined, no change in status, stable for surgery.  I have reviewed the patient's chart and labs.  Questions were answered to the patient's satisfaction.     Coca ColaMark Skains

## 2017-06-28 NOTE — Progress Notes (Signed)
  Echocardiogram 2D Echocardiogram has been performed.  Steven SavoyCasey N Hadiya Spoerl 06/28/2017, 9:15 AM

## 2017-06-28 NOTE — Transfer of Care (Signed)
Immediate Anesthesia Transfer of Care Note  Patient: Steven Montoya.  Procedure(s) Performed: TRANSESOPHAGEAL ECHOCARDIOGRAM (TEE) (N/A )  Patient Location: Endoscopy Unit  Anesthesia Type:MAC  Level of Consciousness: awake, oriented and patient cooperative  Airway & Oxygen Therapy: Patient Spontanous Breathing and Patient connected to nasal cannula oxygen  Post-op Assessment: Report given to RN, Post -op Vital signs reviewed and stable and Patient moving all extremities  Post vital signs: Reviewed and stable  Last Vitals:  Vitals:   06/28/17 0719 06/28/17 0858  BP: (!) 139/102 118/72  Pulse:  91  Resp: 17 (!) 21  Temp: 36.7 C 36.6 C  SpO2: 100% 97%    Last Pain:  Vitals:   06/28/17 0858  TempSrc: Oral  PainSc: 9       Patients Stated Pain Goal: 6 (76/16/07 3710)  Complications: No apparent anesthesia complications

## 2017-06-28 NOTE — Telephone Encounter (Signed)
Per pt's mother's call she needs a call back about instructions to be done by pt after his procedures.    Please give her a call back.

## 2017-06-28 NOTE — H&P (View-Only) (Signed)
CARDIOLOGY OFFICE NOTE  Date:  06/22/2017    Steven Guyonnie R Sherwood Date of Birth: [birth year] Medical Record #[id number]  PCP:  Patient, No Pcp Per  Cardiologist:  [facility]   Chief Complaint  Patient presents with  . Cardiac Valve Problem    Post echo visit - seen for Dr. Anne FuSkains    History of Present Illness: Steven Montoya is a 41 y.o. male who presents today for a discussion for TEE. Seen for Dr. Anne FuSkains.   He has a history of mitral valve prolapse and moderate mitral regurgitation. Initially seen here back in February of 2018 - [facility] noted loud murmur. ECHO ordered and showed MVP with moderate MR, normal EF and mild LAE. LV noted to be dilated. Patient was not SOB, no syncope, no diaphoresis. No recent fevers. He has a history of drug abuse and ETOH use. He smokes. No prior history of endocarditis.   He was felt to be doing well at his visit here from February of 2018. Echo was ordered - does not look like it was completed.   I then saw him earlier this month - no PCP, lots of complaints -  losing weight. Anxious, weak, short of breath, chest pain with lying down. Still smoking. Got his echo updated - see below - reviewed with Dr. Anne FuSkains. TEE has been recommended.   He has been to the ER for chest pain since his last visit here - did not stay to be seen.   Comes in today. Here with his wife. He says he "could be doing better". Still with generalized weakness. Short of breath at times. Continues to smoke - but trying to smoke less. This spell that led to the ER visit is described as he got acutely ill with being lightheaded - started throwing up - then "passed out" and hit the tub. Wife took him to the ER - they both say he was "blown off and treated like a junkie" - so they left. This has not recurred. He has no current drug use other than smoking.   Past Medical History:  Diagnosis Date  . Chronic urethral  stricture   . Diverticulitis   . Frequency of urination   . Headache(784.0)   . Lower abdominal pain   . Mitral valve prolapse   . Nocturia   . Urgency of urination   . Urinary straining     Past Surgical History:  Procedure Laterality Date  . COLONOSCOPY N/A 08/17/2013   Procedure: COLONOSCOPY;  Surgeon: Romie LeveeAlicia Thomas, MD;  Location: [facility] ENDOSCOPY;  Service: Endoscopy;  Laterality: N/A;  . CYSTO/ RETROGRADE PYELOGRAM/ URETHRAL DILATION  11-05-2010  . TOOTH EXTRACTION     canines     Medications: No outpatient prescriptions have been marked as taking for the 06/22/17 encounter (Office Visit) with Rosalio MacadamiaGerhardt, Gerrell Tabet C, NP.     Allergies: Allergies  Allergen Reactions  . Latex Other (See Comments)    HX SUPERFICIAL THROMBOPHLEBITIS LEFT FOREARM AFTER IV IN APR 2011  . Adhesive [Tape] Rash    Social History: The patient  reports that he has been smoking Cigarettes.  He has a 2.00 pack-year smoking history. His smokeless tobacco use includes Chew and Snuff. He reports that he does not drink alcohol or use drugs.   Family History: The patient's family history includes Heart disease in his father.   Review of Systems: Please see the history of present illness.  Otherwise, the review of systems is positive for none.   All other systems are reviewed and negative.   Physical Exam: VS:  BP 138/90 (BP Location: Left Arm, Patient Position: Sitting)   Pulse 72   Wt 149 lb (67.6 kg)   BMI 20.21 kg/m  .  BMI Body mass index is 20.21 kg/m.  Wt Readings from Last 3 Encounters:  06/22/17 149 lb (67.6 kg)  06/06/17 147 lb (66.7 kg)  01/13/17 160 lb (72.6 kg)    General: Quite thin. Ruddy complexion. Smells of tobacco. Multiple tattoos.  Alert and in no acute distress.   HEENT: Normal.  Neck: Supple, no JVD, carotid bruits, or masses noted.  Cardiac: Regular rate and rhythm. Loud holosystolic murmur. No edema.  Respiratory:  Lungs are clear to auscultation bilaterally with normal  work of breathing.  GI: Soft and nontender.  MS: No deformity or atrophy. Gait and ROM intact.  Skin: Warm and dry. Color is normal.  Neuro:  Strength and sensation are intact and no gross focal deficits noted.  Psych: Alert, appropriate and with normal affect.   LABORATORY DATA:  EKG:  EKG is not ordered today.  Lab Results  Component Value Date   WBC 9.6 06/10/2017   HGB 14.4 06/10/2017   HCT 40.8 06/10/2017   PLT 307 06/10/2017   GLUCOSE 79 06/10/2017   ALT 16 06/06/2017   AST 17 06/06/2017   NA 138 06/10/2017   K 4.1 06/10/2017   CL 102 06/10/2017   CREATININE 0.96 06/10/2017   BUN 10 06/10/2017   CO2 27 06/10/2017   TSH 0.632 06/06/2017     BNP (last 3 results) No results for input(s): BNP in the last 8760 hours.  ProBNP (last 3 results) No results for input(s): PROBNP in the last 8760 hours.   Other Studies Reviewed Today:  Echo Study Conclusions 05/2017  - Left ventricle: The cavity size was normal. Wall thickness was   increased in a pattern of mild LVH. Systolic function was normal.   The estimated ejection fraction was in the range of 60% to 65%.   Wall motion was normal; there were no regional wall motion   abnormalities. Left ventricular diastolic function parameters   were normal. - Mitral valve: Mild posterior leaflet prolapse. Mildly thickened   leaflets . There was moderate eccentric regurgitation. - Left atrium: The atrium was mildly dilated.  Impressions:  - Consider TEE for a more comprehensive assessment of mitral   regurgitant jet.   Notes recorded by Jake Bathe, MD on 06/13/2017 at 1:35 PM EDT Spoke to [facility], okay to schedule TEE with me. Personally reviewed echocardiogram, moderate to severe mitral valve prolapse posterior leaflet with eccentric jet wrapping around left atrium which is mildly dilated. Regurgitation appears at least moderate could be severe. Donato Schultz, MD   Assessment/Plan:  1. Moderate MV Regurgitation  with moderate posterior mitral valve prolapse. - no signs of heart failure or fluid overload but complaining more of shortness of breath, atypical chest pain and generalized weakness and now possible syncope - have reviewed most recent echo with Dr. Anne Fu - TEE has been advised - will arrange with Dr. Anne Fu. The procedure, risks of perforation/aspiration/oversedation and the benefits have been discussed and he is anxious to proceed. Arranged with Dr. Anne Fu on October 30th. May need referral to Dr. Cornelius Moras for consideration of minimally invasive MV repair. Further disposition to follow.   2. Family history of MV disease - father died after surgery (  this was with open sternotomy) - he is pretty hesitant about having to have surgery himself.   3. Smoker - encouraged cessation. He is not ready. Advised again to try and keep cutting back.   4. History of polysubstance abuse - no history of endocarditis. Denies alcohol and drugs at this time.   5. Weight loss - suspected this is more related to his anxiety - his recent labs were ok. Suggested again to establish at [facility] for primary care needs.   Current medicines are reviewed with the patient today.  The patient does not have concerns regarding medicines other than what has been noted above.  The following changes have been made:  See above.  Labs/ tests ordered today include:   No orders of the defined types were placed in this encounter.    Disposition:   Further disposition to follow.   Patient is agreeable to this plan and will call if any problems develop in the interim.   SignedNorma Fredrickson, NP  06/22/2017 10:29 AM  [facility] [address] [place], [place]  [address] Phone: [phone] Fax: [phone]

## 2017-06-28 NOTE — Telephone Encounter (Signed)
Mother on pt's DPR.  I spoke with pt's mother who reports pt told her he has to have surgery.  Mother is asking if this is correct. I told her pt has been referred to [facility] for evaluation.

## 2017-06-28 NOTE — Discharge Instructions (Signed)

## 2017-06-28 NOTE — Anesthesia Preprocedure Evaluation (Addendum)
Anesthesia Evaluation  Patient identified by MRN, date of birth, ID band Patient awake    Reviewed: Allergy & Precautions, NPO status , Patient's Chart, lab work & pertinent test results  Airway Mallampati: II  TM Distance: >3 FB Neck ROM: Full    Dental  (+) Teeth Intact, Dental Advisory Given   Pulmonary Current Smoker,    breath sounds clear to auscultation       Cardiovascular  Rhythm:Regular Rate:Normal + Systolic murmurs    Neuro/Psych PSYCHIATRIC DISORDERS Depression    GI/Hepatic   Endo/Other    Renal/GU      Musculoskeletal   Abdominal   Peds  Hematology   Anesthesia Other Findings   Reproductive/Obstetrics                           Anesthesia Physical Anesthesia Plan  ASA: III  Anesthesia Plan: MAC   Post-op Pain Management:    Induction: Intravenous  PONV Risk Score and Plan: 0  Airway Management Planned: Nasal Cannula  Additional Equipment: None  Intra-op Plan:   Post-operative Plan:   Informed Consent:   Dental advisory given  Plan Discussed with: CRNA, Anesthesiologist and Surgeon  Anesthesia Plan Comments:         Anesthesia Quick Evaluation

## 2017-07-04 ENCOUNTER — Encounter: Payer: Self-pay | Admitting: Thoracic Surgery (Cardiothoracic Vascular Surgery)

## 2017-07-04 ENCOUNTER — Other Ambulatory Visit: Payer: Self-pay | Admitting: *Deleted

## 2017-07-04 ENCOUNTER — Other Ambulatory Visit: Payer: Self-pay

## 2017-07-04 ENCOUNTER — Institutional Professional Consult (permissible substitution) (INDEPENDENT_AMBULATORY_CARE_PROVIDER_SITE_OTHER): Payer: Medicaid Other | Admitting: Thoracic Surgery (Cardiothoracic Vascular Surgery)

## 2017-07-04 VITALS — BP 108/76 | HR 88 | Resp 16 | Ht 72.0 in | Wt 150.0 lb

## 2017-07-04 DIAGNOSIS — I341 Nonrheumatic mitral (valve) prolapse: Secondary | ICD-10-CM | POA: Diagnosis not present

## 2017-07-04 DIAGNOSIS — I340 Nonrheumatic mitral (valve) insufficiency: Secondary | ICD-10-CM

## 2017-07-04 DIAGNOSIS — Z01818 Encounter for other preprocedural examination: Secondary | ICD-10-CM

## 2017-07-04 DIAGNOSIS — I71019 Dissection of thoracic aorta, unspecified: Secondary | ICD-10-CM

## 2017-07-04 DIAGNOSIS — I7409 Other arterial embolism and thrombosis of abdominal aorta: Secondary | ICD-10-CM

## 2017-07-04 DIAGNOSIS — I7101 Dissection of thoracic aorta: Secondary | ICD-10-CM

## 2017-07-04 NOTE — Progress Notes (Signed)
     [address]       [place] [address]             [phone]     CARDIOTHORACIC SURGERY CONSULTATION REPORT  Referring Provider is Skains, Mark C, MD PCP is Patient, No Pcp Per  Chief Complaint  Patient presents with  . Mitral Regurgitation    MVP...ECHO TEE 06/28/17    HPI:  Patient is a 40-year-old male with Silveri-standing history of tobacco abuse and remote history of drug and alcohol use who has been referred for surgical consultation to discuss treatment options for management of severe symptomatic primary mitral regurgitation.  Approximately 1-1/2 years ago the patient was incarcerated in prison.  The prison physician noted a loud systolic murmur on exam.  An echocardiogram was performed and reportedly revealed mitral valve prolapse with at least moderate mitral regurgitation and normal left ventricular systolic function.  He completed his 8-month prison sentence and has since then completed his parole.   He was referred to Dr. Skains who first evaluated the patient in February of this year.  An echocardiogram was ordered but never completed.  The patient returns for follow-up and was evaluated by Lori Gerhardt last month.  Subsequent transthoracic echocardiogram performed June 08, 2017 revealed normal left ventricular systolic function with ejection fraction estimated 60-65% there was mitral valve prolapse with at least moderate mitral regurgitation.  Transesophageal echocardiogram was performed June 28, 2017 and revealed severe prolapse involving the posterior leaflet with severe mitral regurgitation.  Left ventricular systolic function was normal.  The patient was referred for elective surgical consultation.  The patient is married and lives locally in [place] with his wife.  He has 4 children of his own and his wife has 4 children from a previous marriage.  Most are adults and currently 1 is living at home.  The patient has been disabled reportedly  because of ADHD and back problems.  He is not currently employed.  He states that when he was in prison he was told he should not exercise at all because of his heart murmur.  He has a Asman-standing history of heavy tobacco abuse.  He has cut back but still smokes 1/2 packs cigarettes daily.  He admits that the remote past used occasional cocaine and smoked marijuana.  He has never used intravenous drugs.  He has not used cocaine in more than 10 years and he has not smoked marijuana in more than 3 years.  He does not drink alcohol.  He states that he gets tightness across his chest and short of breath with more strenuous physical exertion.  He has had some palpitations and one episode when he passed out briefly several weeks ago.  He was taken to the emergency department but left AGAINST MEDICAL ADVICE.  He has not had any dizzy spells since that time.  He specifically denies any history of resting shortness of breath, PND, orthopnea, or lower extremity edema.  He states that he cannot sleep laying on his left side because of feeling his heart pounding.  Past Medical History:  Diagnosis Date  . Chronic urethral stricture   . Diverticulitis   . Frequency of urination   . Headache(784.0)   . Lower abdominal pain   . Mitral valve prolapse   . Nocturia   . Urgency of urination   . Urinary straining     Past Surgical History:  Procedure Laterality Date  . CYSTO/ RETROGRADE PYELOGRAM/ URETHRAL DILATION    11-05-2010  . TOOTH EXTRACTION     canines    Family History  Problem Relation Age of Onset  . Heart disease Father     Social History   Socioeconomic History  . Marital status: Married    Spouse name: Not on file  . Number of children: Not on file  . Years of education: Not on file  . Highest education level: Not on file  Social Needs  . Financial resource strain: Not on file  . Food insecurity - worry: Not on file  . Food insecurity - inability: Not on file  . Transportation needs -  medical: Not on file  . Transportation needs - non-medical: Not on file  Occupational History  . Not on file  Tobacco Use  . Smoking status: Current Every Day Smoker    Packs/day: 0.50    Years: 4.00    Pack years: 2.00    Types: Cigarettes  . Smokeless tobacco: Current User    Types: Chew, Snuff  Substance and Sexual Activity  . Alcohol use: No    Alcohol/week: 100.8 oz    Types: 168 Cans of beer per week    Comment: Using for 25 years  . Drug use: No    Comment: pt is in jail  . Sexual activity: Not on file  Other Topics Concern  . Not on file  Social History Narrative  . Not on file    No current outpatient medications on file.   No current facility-administered medications for this visit.     Allergies  Allergen Reactions  . Latex Other (See Comments)    HX SUPERFICIAL THROMBOPHLEBITIS LEFT FOREARM AFTER IV IN APR 2011  . Adhesive [Tape] Rash      Review of Systems:   General:  normal appetite, decreased energy, no weight gain, no weight loss, no fever  Cardiac:  + chest pain with exertion, no chest pain at rest, +SOB with exertion, no resting SOB, no PND, no orthopnea, + palpitations, no arrhythmia, no atrial fibrillation, no LE edema, + dizzy spells, + syncope  Respiratory:  + shortness of breath, no home oxygen, no productive cough, no dry cough, no bronchitis, no wheezing, no hemoptysis, no asthma, no pain with inspiration or cough, no sleep apnea, no CPAP at night  GI:   no difficulty swallowing, no reflux, no frequent heartburn, no hiatal hernia, no abdominal pain, no constipation, no diarrhea, no hematochezia, no hematemesis, no melena  GU:   no dysuria,  no frequency, no urinary tract infection, no hematuria, no enlarged prostate, no kidney stones, no kidney disease  Vascular:  no pain suggestive of claudication, no pain in feet, no leg cramps, no varicose veins, no DVT, no non-healing foot ulcer  Neuro:   no stroke, no TIA's, no seizures, no headaches, no  temporary blindness one eye,  no slurred speech, no peripheral neuropathy, + chronic pain, no instability of gait, no memory/cognitive dysfunction  Musculoskeletal: no arthritis, no joint swelling, no myalgias, no difficulty walking, normal mobility   Skin:   no rash, no itching, no skin infections, no pressure sores or ulcerations  Psych:   + anxiety, no depression, no nervousness, no unusual recent stress  Eyes:   no blurry vision, no floaters, no recent vision changes, does not wears glasses or contacts  ENT:   no hearing loss, no loose or painful teeth, edentulous with full dentures, last saw dentist 3 years ago  Hematologic:  no easy bruising, no abnormal bleeding,   no clotting disorder, no frequent epistaxis  Endocrine:  no diabetes, does not check CBG's at [facility]     Physical Exam:   BP 108/76 (BP Location: Right Arm, Patient Position: Sitting, Cuff Size: Large)   Pulse 88   Resp 16   Ht 6' (1.829 m)   Wt 150 lb (68 kg)   SpO2 99% Comment: ON RA  BMI 20.34 kg/m   General:  Thin,  well-appearing  HEENT:  Unremarkable   Neck:   no JVD, no bruits, no adenopathy   Chest:   clear to auscultation, symmetrical breath sounds, no wheezes, no rhonchi   CV:   RRR, prominent grade IV/VI holosystolic murmur   Abdomen:  soft, non-tender, no masses   Extremities:  warm, well-perfused, pulses palpable, no LE edema  Rectal/GU  Deferred  Neuro:   Grossly non-focal and symmetrical throughout  Skin:   Clean and dry, no rashes, no breakdown   Diagnostic Tests:  Transthoracic Echocardiography  Patient:    Whichard, Devarius R MR #:       [id number] Study Date: 06/08/2017 Gender:     M Age:        40 Height:     182.9 cm Weight:     66.7 kg BSA:        1.83 m^2 Pt. Status: Room:   ATTENDING    Traci Turner, MD  ORDERING     Gerhardt, Lori C  REFERRING    Gerhardt, Lori C  SONOGRAPHER  Tammie Crouch, RCS  PERFORMING   [facility],  Outpatient  cc:  ------------------------------------------------------------------- LV EF: 60% -   65%  ------------------------------------------------------------------- Indications:      Murmur (R01.1).  ------------------------------------------------------------------- History:   PMH:   Mitral valve prolapse.  Risk factors:  Current tobacco use.  ------------------------------------------------------------------- Study Conclusions  - Left ventricle: The cavity size was normal. Wall thickness was   increased in a pattern of mild LVH. Systolic function was normal.   The estimated ejection fraction was in the range of 60% to 65%.   Wall motion was normal; there were no regional wall motion   abnormalities. Left ventricular diastolic function parameters   were normal. - Mitral valve: Mild posterior leaflet prolapse. Mildly thickened   leaflets . There was moderate eccentric regurgitation. - Left atrium: The atrium was mildly dilated.  Impressions:  - Consider TEE for a more comprehensive assessment of mitral   regurgitant jet.  ------------------------------------------------------------------- Study data:  No prior study was available for comparison.  Study status:  Routine.  Procedure:  The patient reported no pain pre or post test. Transthoracic echocardiography. Image quality was adequate.          Transthoracic echocardiography.  M-mode, complete 2D, spectral Doppler, and color Doppler.  Birthdate: Patient birthdate: 07/17/1976.  Age:  Patient is 40 yr old.  Sex: Gender: male.    BMI: 19.9 kg/m^2.  Blood pressure:     120/80 Patient status:  Outpatient.  Study date:  Study date: 06/08/2017. Study time: 03:09 PM.  Location:  [facility]  -------------------------------------------------------------------  ------------------------------------------------------------------- Left ventricle:  The cavity size was normal. Wall thickness was increased in  a pattern of mild LVH. Systolic function was normal. The estimated ejection fraction was in the range of 60% to 65%. Wall motion was normal; there were no regional wall motion abnormalities. The transmitral flow pattern was normal. The deceleration time of the early transmitral flow velocity was normal. The pulmonary vein flow pattern was normal. The   tissue Doppler parameters were normal. Left ventricular diastolic function parameters were normal.  ------------------------------------------------------------------- Aortic valve:   Trileaflet.  Doppler:   There was no stenosis. There was no regurgitation.  ------------------------------------------------------------------- Aorta:  Aortic root: The aortic root was normal in size.  ------------------------------------------------------------------- Mitral valve:  Mild posterior leaflet prolapse.  Mildly thickened leaflets .  Doppler:  There was moderate eccentric regurgitation.  Valve area by pressure half-time: 4.4 cm^2. Indexed valve area by pressure half-time: 2.4 cm^2/m^2.    Peak gradient (D): 6 mm Hg.   ------------------------------------------------------------------- Left atrium:  The atrium was mildly dilated.  ------------------------------------------------------------------- Atrial septum:  No defect or patent foramen ovale was identified.   ------------------------------------------------------------------- Right ventricle:  The cavity size was normal. Wall thickness was normal. Systolic function was normal.  ------------------------------------------------------------------- Pulmonic valve:    The valve appears to be grossly normal. Doppler:  There was no significant regurgitation.  ------------------------------------------------------------------- Tricuspid valve:   Structurally normal valve.   Leaflet separation was normal.  Doppler:  Transvalvular velocity was within the normal range. There was trivial  regurgitation.  ------------------------------------------------------------------- Right atrium:  The atrium was normal in size.  ------------------------------------------------------------------- Pericardium:  There was no pericardial effusion.  ------------------------------------------------------------------- Systemic veins: Inferior vena cava: The vessel was normal in size. The respirophasic diameter changes were in the normal range (= 50%), consistent with normal central venous pressure. Diameter: 20 mm.  ------------------------------------------------------------------- Measurements   IVC                                      Value          Reference  ID                                       20    mm       ----------    Left ventricle                           Value          Reference  LV ID, ED, PLAX chordal          (H)     56    mm       43 - 52  LV ID, ES, PLAX chordal                  38    mm       23 - 38  LV fx shortening, PLAX chordal           32    %        >=29  LV PW thickness, ED                      13    mm       ----------  IVS/LV PW ratio, ED                      0.77           <=1.3  Stroke volume, 2D                        76    ml       ----------    Stroke volume/bsa, 2D                    42    ml/m^2   ----------  LV e&', lateral                           16.2  cm/s     ----------  LV E/e&', lateral                         7.41           ----------  LV e&', medial                            9.57  cm/s     ----------  LV E/e&', medial                          12.54          ----------  LV e&', average                           12.89 cm/s     ----------  LV E/e&', average                         9.31           ----------    Ventricular septum                       Value          Reference  IVS thickness, ED                        10    mm       ----------    LVOT                                     Value          Reference  LVOT ID, S                                22    mm       ----------  LVOT area                                3.8   cm^2     ----------  LVOT peak velocity, S                    105   cm/s     ----------  LVOT mean velocity, S                    74.1  cm/s     ----------  LVOT VTI, S                              20    cm       ----------    Aorta                                      Value          Reference  Aortic root ID, ED                       36    mm       ----------    Left atrium                              Value          Reference  LA ID, A-P, ES                           41    mm       ----------  LA ID/bsa, A-P                   (H)     2.24  cm/m^2   <=2.2  LA volume, S                             74.3  ml       ----------  LA volume/bsa, S                         40.6  ml/m^2   ----------  LA volume, ES, 1-p A4C                   60.1  ml       ----------  LA volume/bsa, ES, 1-p A4C               32.8  ml/m^2   ----------  LA volume, ES, 1-p A2C                   92.5  ml       ----------  LA volume/bsa, ES, 1-p A2C               50.5  ml/m^2   ----------    Mitral valve                             Value          Reference  Mitral E-wave peak velocity              120   cm/s     ----------  Mitral A-wave peak velocity              59.7  cm/s     ----------  Mitral deceleration time                 169   ms       150 - 230  Mitral pressure half-time                50    ms       ----------  Mitral peak gradient, D                  6     mm Hg    ----------  Mitral E/A ratio, peak                   2              ----------    Mitral valve area, PHT, DP               4.4   cm^2     ----------  Mitral valve area/bsa, PHT, DP           2.4   cm^2/m^2 ----------  Mitral regurg VTI, PISA                  125   cm       ----------    Pulmonary arteries                       Value          Reference  PA pressure, S, DP                       23    mm Hg    <=30    Tricuspid valve                           Value          Reference  Tricuspid regurg peak velocity           225   cm/s     ----------  Tricuspid peak RV-RA gradient            20    mm Hg    ----------  Tricuspid maximal regurg                 225   cm/s     ----------  velocity, PISA    Right atrium                             Value          Reference  RA ID, S-I, ES, A4C              (H)     49.1  mm       34 - 49  RA area, ES, A4C                         15.1  cm^2     8.3 - 19.5  RA volume, ES, A/L                       38.9  ml       ----------  RA volume/bsa, ES, A/L                   21.2  ml/m^2   ----------    Systemic veins                           Value          Reference  Estimated CVP                            3     mm Hg    ----------    Right ventricle                          Value          Reference  TAPSE                                      23    mm       ----------  RV pressure, S, DP                       23    mm Hg    <=30  RV s&', lateral, S                        11.7  cm/s     ----------  Legend: (L)  and  (H)  mark values outside specified reference range.  ------------------------------------------------------------------- Prepared and Electronically Authenticated by  Suresh Koneswaran, MD 2018-10-10T16:41:08   Transesophageal Echocardiography  Patient:    Sweigert, Layden R MR #:       [id number] Study Date: 06/28/2017 Gender:     M Age:        40 Height:     182.9 cm Weight:     67.7 kg BSA:        1.85 m^2 Pt. Status: Room:   ADMITTING    [name], M.D.  ATTENDING    [name], M.D.  PERFORMING   [name], M.D.  ORDERING     Gerhardt, Lori C  REFERRING    Gerhardt, Lori C  SONOGRAPHER  Casey Kirkpatrick  cc:  ------------------------------------------------------------------- LV EF: 60% -   65%  ------------------------------------------------------------------- Indications:      Mitral valve prolapse 424.0.  Mitral regurgitation  424.0.  ------------------------------------------------------------------- History:   Risk factors:  Current tobacco use.  ------------------------------------------------------------------- Study Conclusions  - Left ventricle: The cavity size was normal. Wall thickness was   normal. Systolic function was normal. The estimated ejection   fraction was in the range of 60% to 65%. - Aortic valve: No evidence of vegetation. - Mitral valve: Severe prolapse, involving the posterior leaflet.   No evidence of vegetation. There was severe regurgitation   directed eccentrically wrapping around left atrium. - Left atrium: The atrium was mildly dilated. No evidence of   thrombus in the appendage. - Atrial septum: No defect or patent foramen ovale was identified.  Recommendations:  Will refer to [facility] for mitral valve repair evaluation.  ------------------------------------------------------------------- Study data:   Study status:  Routine.  Consent:  The risks, benefits, and alternatives to the procedure were explained to the patient and informed consent was obtained.  Procedure:  The patient reported no pain pre or post test. Initial setup. The patient was brought to the laboratory. Surface ECG leads were monitored. Sedation. Conscious sedation was administered by anesthesiology staff. Transesophageal echocardiography. Topical anesthesia was obtained using viscous lidocaine. A transesophageal probe was inserted by the attending cardiologist. Image quality was adequate.  Study completion:  The patient tolerated the procedure well. There were no complications.          Diagnostic transesophageal echocardiography.  2D and color Doppler.  Birthdate:  Patient birthdate: 11/16/1975.  Age:  Patient is 40 yr old.  Sex:  Gender: male.    BMI: 20.3 kg/m^2.  Blood pressure:     139/102  Patient status:  Inpatient.  Study date:  Study date: 06/28/2017. Study time: 08:21 AM.  Location:   Endoscopy.  -------------------------------------------------------------------  ------------------------------------------------------------------- Left ventricle:  The cavity size was normal. Wall thickness was normal. Systolic function was normal. The estimated ejection fraction was in the range of 60% to 65%.  ------------------------------------------------------------------- Aortic valve:   Structurally normal valve.   Cusp separation was normal.  No evidence of vegetation.  Doppler:    There was no regurgitation.  ------------------------------------------------------------------- Aorta:  The aorta was normal, not dilated, and non-diseased.  ------------------------------------------------------------------- Mitral valve:  Leaflet separation was normal.  Severe prolapse, involving the posterior leaflet.  No evidence of vegetation. Doppler:  There was severe regurgitation directed eccentrically wrapping around left atrium.  ------------------------------------------------------------------- Left atrium:  The atrium was mildly dilated.  No evidence of thrombus in the appendage. The appendage was of normal size. Emptying velocity was normal.  ------------------------------------------------------------------- Atrial septum:  No defect or patent foramen ovale was identified. Echo contrast study showed no right-to-left atrial level shunt, following an increase in RA pressure induced by provocative maneuvers.  ------------------------------------------------------------------- Right ventricle:  The cavity size was normal. Wall thickness was normal. Systolic function was normal.  ------------------------------------------------------------------- Pulmonic valve:    Structurally normal valve.   Cusp separation was normal.  No evidence of vegetation.  ------------------------------------------------------------------- Tricuspid valve:   Structurally normal valve.    Leaflet separation was normal.  No evidence of vegetation.  Doppler:  There was no regurgitation.  ------------------------------------------------------------------- Pulmonary artery:   The main pulmonary artery was normal-sized.  ------------------------------------------------------------------- Right atrium:  The atrium was normal in size.  No evidence of thrombus in the atrial cavity or appendage.  ------------------------------------------------------------------- Pericardium:  The pericardium was normal in appearance. There was no pericardial effusion.  ------------------------------------------------------------------- Systemic veins: Superior vena cava: The study excluded a thrombus.  ------------------------------------------------------------------- Post procedure conclusions Ascending Aorta:  - The aorta was normal, not dilated, and non-diseased.  ------------------------------------------------------------------- Prepared and Electronically Authenticated by  [name], M.D. 2018-10-30T09:24:29   Impression:  Patient has mitral valve prolapse with stage D severe symptomatic primary mitral regurgitation.  He describes symptoms of exertional shortness of breath and chest pain consistent with chronic diastolic congestive heart failure, New York Heart Association functional class I-II.  The patient also had a recent syncopal episode of unclear etiology.  I have personally reviewed the patient's recent transthoracic and transesophageal echocardiograms.  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  There are multiple elongated chordae tendinae with severe mitral regurgitation that courses anteriorly around the left atrium.  Left ventricular systolic function remains normal.  I agree the patient needs elective mitral valve repair.   Plan:  The patient was counseled at  length regarding the indications, risks and potential benefits of mitral valve repair.  The rationale for elective surgery has been explained, including a comparison between surgery and continued medical therapy with close follow-up.  The likelihood of successful and durable valve repair has been discussed with particular reference to the findings of their recent echocardiogram.  Based upon these findings and previous experience, I have quoted them a greater than 95 percent likelihood of successful valve repair.  Alternative surgical approaches have been discussed including a comparison between conventional sternotomy and minimally-invasive techniques.  Expectations for his postoperative convalescence have been discussed.  The patient is eager to proceed with surgery in the near future.  I have instructed the patient to stop smoking altogether.  As a next step he will need to undergo left and right heart catheterization.  In the absence of significant coronary artery disease he will subsequently undergo CT angiography to evaluate the feasibility of peripheral arterial cannulation for surgery.  We tentatively plan to proceed with minimally invasive mitral valve repair on August 02, 2017.  The patient will return to our office for follow-up prior to surgery on August 01, 2017.  I have   suggested that it would be wise for his wife to come for his follow-up visit at that time.  All questions answered.   I spent in excess of 90 minutes during the conduct of this office consultation and >50% of this time involved direct face-to-face encounter with the patient for counseling and/or coordination of their care.    Itali Mckendry H. Izzy Doubek, MD 07/04/2017 6:23 PM  

## 2017-07-04 NOTE — H&P (View-Only) (Signed)
[address]       [place] [address]             [phone]     CARDIOTHORACIC SURGERY CONSULTATION REPORT  Referring Provider is Jake Bathe, MD PCP is Patient, No Pcp Per  Chief Complaint  Patient presents with  . Mitral Regurgitation    MVP...ECHO TEE 06/28/17    HPI:  Patient is a 41 year old male with [place]-standing history of tobacco abuse and remote history of drug and alcohol use who has been referred for surgical consultation to discuss treatment options for management of severe symptomatic primary mitral regurgitation.  Approximately 1-1/2 years ago the patient was incarcerated in prison.  The prison physician noted a loud systolic murmur on exam.  An echocardiogram was performed and reportedly revealed mitral valve prolapse with at least moderate mitral regurgitation and normal left ventricular systolic function.  He completed his 108-month prison sentence and has since then completed his parole.   He was referred to Dr. Anne Fu who first evaluated the patient in February of this year.  An echocardiogram was ordered but never completed.  The patient returns for follow-up and was evaluated by Norma Fredrickson last month.  Subsequent transthoracic echocardiogram performed June 08, 2017 revealed normal left ventricular systolic function with ejection fraction estimated 60-65% there was mitral valve prolapse with at least moderate mitral regurgitation.  Transesophageal echocardiogram was performed June 28, 2017 and revealed severe prolapse involving the posterior leaflet with severe mitral regurgitation.  Left ventricular systolic function was normal.  The patient was referred for elective surgical consultation.  The patient is married and lives locally in [place] with his wife.  He has 4 children of his own and his wife has 4 children from a previous marriage.  Most are adults and currently 1 is living at home.  The patient has been disabled reportedly  because of ADHD and back problems.  He is not currently employed.  He states that when he was in prison he was told he should not exercise at all because of his heart murmur.  He has a [place]-standing history of heavy tobacco abuse.  He has cut back but still smokes 1/2 packs cigarettes daily.  He admits that the remote past used occasional cocaine and smoked marijuana.  He has never used intravenous drugs.  He has not used cocaine in more than 10 years and he has not smoked marijuana in more than 3 years.  He does not drink alcohol.  He states that he gets tightness across his chest and short of breath with more strenuous physical exertion.  He has had some palpitations and one episode when he passed out briefly several weeks ago.  He was taken to the emergency department but left AGAINST MEDICAL ADVICE.  He has not had any dizzy spells since that time.  He specifically denies any history of resting shortness of breath, PND, orthopnea, or lower extremity edema.  He states that he cannot sleep laying on his left side because of feeling his heart pounding.  Past Medical History:  Diagnosis Date  . Chronic urethral stricture   . Diverticulitis   . Frequency of urination   . Headache(784.0)   . Lower abdominal pain   . Mitral valve prolapse   . Nocturia   . Urgency of urination   . Urinary straining     Past Surgical History:  Procedure Laterality Date  . CYSTO/ RETROGRADE PYELOGRAM/ URETHRAL DILATION  11-05-2010  . TOOTH EXTRACTION     canines    Family History  Problem Relation Age of Onset  . Heart disease Father     Social History   Socioeconomic History  . Marital status: Married    Spouse name: Not on file  . Number of children: Not on file  . Years of education: Not on file  . Highest education level: Not on file  Social Needs  . Financial resource strain: Not on file  . Food insecurity - worry: Not on file  . Food insecurity - inability: Not on file  . Transportation needs -  medical: Not on file  . Transportation needs - non-medical: Not on file  Occupational History  . Not on file  Tobacco Use  . Smoking status: Current Every Day Smoker    Packs/day: 0.50    Years: 4.00    Pack years: 2.00    Types: Cigarettes  . Smokeless tobacco: Current User    Types: Chew, Snuff  Substance and Sexual Activity  . Alcohol use: No    Alcohol/week: 100.8 oz    Types: 168 Cans of beer per week    Comment: Using for 25 years  . Drug use: No    Comment: pt is in jail  . Sexual activity: Not on file  Other Topics Concern  . Not on file  Social History Narrative  . Not on file    No current outpatient medications on file.   No current facility-administered medications for this visit.     Allergies  Allergen Reactions  . Latex Other (See Comments)    HX SUPERFICIAL THROMBOPHLEBITIS LEFT FOREARM AFTER IV IN APR 2011  . Adhesive [Tape] Rash      Review of Systems:   General:  normal appetite, decreased energy, no weight gain, no weight loss, no fever  Cardiac:  + chest pain with exertion, no chest pain at rest, +SOB with exertion, no resting SOB, no PND, no orthopnea, + palpitations, no arrhythmia, no atrial fibrillation, no LE edema, + dizzy spells, + syncope  Respiratory:  + shortness of breath, no home oxygen, no productive cough, no dry cough, no bronchitis, no wheezing, no hemoptysis, no asthma, no pain with inspiration or cough, no sleep apnea, no CPAP at night  GI:   no difficulty swallowing, no reflux, no frequent heartburn, no hiatal hernia, no abdominal pain, no constipation, no diarrhea, no hematochezia, no hematemesis, no melena  GU:   no dysuria,  no frequency, no urinary tract infection, no hematuria, no enlarged prostate, no kidney stones, no kidney disease  Vascular:  no pain suggestive of claudication, no pain in feet, no leg cramps, no varicose veins, no DVT, no non-healing foot ulcer  Neuro:   no stroke, no TIA's, no seizures, no headaches, no  temporary blindness one eye,  no slurred speech, no peripheral neuropathy, + chronic pain, no instability of gait, no memory/cognitive dysfunction  Musculoskeletal: no arthritis, no joint swelling, no myalgias, no difficulty walking, normal mobility   Skin:   no rash, no itching, no skin infections, no pressure sores or ulcerations  Psych:   + anxiety, no depression, no nervousness, no unusual recent stress  Eyes:   no blurry vision, no floaters, no recent vision changes, does not wears glasses or contacts  ENT:   no hearing loss, no loose or painful teeth, edentulous with full dentures, last saw dentist 3 years ago  Hematologic:  no easy bruising, no abnormal bleeding,  no clotting disorder, no frequent epistaxis  Endocrine:  no diabetes, does not check CBG's at [facility]     Physical Exam:   BP 108/76 (BP Location: Right Arm, Patient Position: Sitting, Cuff Size: Large)   Pulse 88   Resp 16   Ht 6' (1.829 m)   Wt 150 lb (68 kg)   SpO2 99% Comment: ON RA  BMI 20.34 kg/m   General:  Thin,  well-appearing  HEENT:  Unremarkable   Neck:   no JVD, no bruits, no adenopathy   Chest:   clear to auscultation, symmetrical breath sounds, no wheezes, no rhonchi   CV:   RRR, prominent grade IV/VI holosystolic murmur   Abdomen:  soft, non-tender, no masses   Extremities:  warm, well-perfused, pulses palpable, no LE edema  Rectal/GU  Deferred  Neuro:   Grossly non-focal and symmetrical throughout  Skin:   Clean and dry, no rashes, no breakdown   Diagnostic Tests:  Transthoracic Echocardiography  Patient:    Tareek, Sabo MR #:       [id number] Study Date: 06/08/2017 Gender:     M Age:        40 Height:     182.9 cm Weight:     66.7 kg BSA:        1.83 m^2 Pt. Status: Room:   ATTENDING    Armanda Magic, MD  Asencion Partridge, Jennet Maduro  REFERRING    Rosalio Macadamia  SONOGRAPHER  Clearence Ped, RCS  PERFORMING   [facility],  Outpatient  cc:  ------------------------------------------------------------------- LV EF: 60% -   65%  ------------------------------------------------------------------- Indications:      Murmur (R01.1).  ------------------------------------------------------------------- History:   PMH:   Mitral valve prolapse.  Risk factors:  Current tobacco use.  ------------------------------------------------------------------- Study Conclusions  - Left ventricle: The cavity size was normal. Wall thickness was   increased in a pattern of mild LVH. Systolic function was normal.   The estimated ejection fraction was in the range of 60% to 65%.   Wall motion was normal; there were no regional wall motion   abnormalities. Left ventricular diastolic function parameters   were normal. - Mitral valve: Mild posterior leaflet prolapse. Mildly thickened   leaflets . There was moderate eccentric regurgitation. - Left atrium: The atrium was mildly dilated.  Impressions:  - Consider TEE for a more comprehensive assessment of mitral   regurgitant jet.  ------------------------------------------------------------------- Study data:  No prior study was available for comparison.  Study status:  Routine.  Procedure:  The patient reported no pain pre or post test. Transthoracic echocardiography. Image quality was adequate.          Transthoracic echocardiography.  M-mode, complete 2D, spectral Doppler, and color Doppler.  Birthdate: Patient birthdate: 24-Mar-1976.  Age:  Patient is 41 yr old.  Sex: Gender: male.    BMI: 19.9 kg/m^2.  Blood pressure:     120/80 Patient status:  Outpatient.  Study date:  Study date: 06/08/2017. Study time: 03:09 PM.  Location:  [facility]  -------------------------------------------------------------------  ------------------------------------------------------------------- Left ventricle:  The cavity size was normal. Wall thickness was increased in  a pattern of mild LVH. Systolic function was normal. The estimated ejection fraction was in the range of 60% to 65%. Wall motion was normal; there were no regional wall motion abnormalities. The transmitral flow pattern was normal. The deceleration time of the early transmitral flow velocity was normal. The pulmonary vein flow pattern was normal. The  tissue Doppler parameters were normal. Left ventricular diastolic function parameters were normal.  ------------------------------------------------------------------- Aortic valve:   Trileaflet.  Doppler:   There was no stenosis. There was no regurgitation.  ------------------------------------------------------------------- Aorta:  Aortic root: The aortic root was normal in size.  ------------------------------------------------------------------- Mitral valve:  Mild posterior leaflet prolapse.  Mildly thickened leaflets .  Doppler:  There was moderate eccentric regurgitation.  Valve area by pressure half-time: 4.4 cm^2. Indexed valve area by pressure half-time: 2.4 cm^2/m^2.    Peak gradient (D): 6 mm Hg.   ------------------------------------------------------------------- Left atrium:  The atrium was mildly dilated.  ------------------------------------------------------------------- Atrial septum:  No defect or patent foramen ovale was identified.   ------------------------------------------------------------------- Right ventricle:  The cavity size was normal. Wall thickness was normal. Systolic function was normal.  ------------------------------------------------------------------- Pulmonic valve:    The valve appears to be grossly normal. Doppler:  There was no significant regurgitation.  ------------------------------------------------------------------- Tricuspid valve:   Structurally normal valve.   Leaflet separation was normal.  Doppler:  Transvalvular velocity was within the normal range. There was trivial  regurgitation.  ------------------------------------------------------------------- Right atrium:  The atrium was normal in size.  ------------------------------------------------------------------- Pericardium:  There was no pericardial effusion.  ------------------------------------------------------------------- Systemic veins: Inferior vena cava: The vessel was normal in size. The respirophasic diameter changes were in the normal range (= 50%), consistent with normal central venous pressure. Diameter: 20 mm.  ------------------------------------------------------------------- Measurements   IVC                                      Value          Reference  ID                                       20    mm       ----------    Left ventricle                           Value          Reference  LV ID, ED, PLAX chordal          (H)     56    mm       43 - 52  LV ID, ES, PLAX chordal                  38    mm       23 - 38  LV fx shortening, PLAX chordal           32    %        >=29  LV PW thickness, ED                      13    mm       ----------  IVS/LV PW ratio, ED                      0.77           <=1.3  Stroke volume, 2D                        76    ml       ----------  Stroke volume/bsa, 2D                    42    ml/m^2   ----------  LV e&', lateral                           16.2  cm/s     ----------  LV E/e&', lateral                         7.41           ----------  LV e&', medial                            9.57  cm/s     ----------  LV E/e&', medial                          12.54          ----------  LV e&', average                           12.89 cm/s     ----------  LV E/e&', average                         9.31           ----------    Ventricular septum                       Value          Reference  IVS thickness, ED                        10    mm       ----------    LVOT                                     Value          Reference  LVOT ID, S                                22    mm       ----------  LVOT area                                3.8   cm^2     ----------  LVOT peak velocity, S                    105   cm/s     ----------  LVOT mean velocity, S                    74.1  cm/s     ----------  LVOT VTI, S                              20    cm       ----------    Aorta  Value          Reference  Aortic root ID, ED                       36    mm       ----------    Left atrium                              Value          Reference  LA ID, A-P, ES                           41    mm       ----------  LA ID/bsa, A-P                   (H)     2.24  cm/m^2   <=2.2  LA volume, S                             74.3  ml       ----------  LA volume/bsa, S                         40.6  ml/m^2   ----------  LA volume, ES, 1-p A4C                   60.1  ml       ----------  LA volume/bsa, ES, 1-p A4C               32.8  ml/m^2   ----------  LA volume, ES, 1-p A2C                   92.5  ml       ----------  LA volume/bsa, ES, 1-p A2C               50.5  ml/m^2   ----------    Mitral valve                             Value          Reference  Mitral E-wave peak velocity              120   cm/s     ----------  Mitral A-wave peak velocity              59.7  cm/s     ----------  Mitral deceleration time                 169   ms       150 - 230  Mitral pressure half-time                50    ms       ----------  Mitral peak gradient, D                  6     mm Hg    ----------  Mitral E/A ratio, peak                   2              ----------  Mitral valve area, PHT, DP               4.4   cm^2     ----------  Mitral valve area/bsa, PHT, DP           2.4   cm^2/m^2 ----------  Mitral regurg VTI, PISA                  125   cm       ----------    Pulmonary arteries                       Value          Reference  PA pressure, S, DP                       23    mm Hg    <=30    Tricuspid valve                           Value          Reference  Tricuspid regurg peak velocity           225   cm/s     ----------  Tricuspid peak RV-RA gradient            20    mm Hg    ----------  Tricuspid maximal regurg                 225   cm/s     ----------  velocity, PISA    Right atrium                             Value          Reference  RA ID, S-I, ES, A4C              (H)     49.1  mm       34 - 49  RA area, ES, A4C                         15.1  cm^2     8.3 - 19.5  RA volume, ES, A/L                       38.9  ml       ----------  RA volume/bsa, ES, A/L                   21.2  ml/m^2   ----------    Systemic veins                           Value          Reference  Estimated CVP                            3     mm Hg    ----------    Right ventricle                          Value          Reference  TAPSE  23    mm       ----------  RV pressure, S, DP                       23    mm Hg    <=30  RV s&', lateral, S                        11.7  cm/s     ----------  Legend: (L)  and  (H)  mark values outside specified reference range.  ------------------------------------------------------------------- Prepared and Electronically Authenticated by  Prentice Docker, MD 2018-10-10T16:41:08   Transesophageal Echocardiography  Patient:    Snyder, Colavito MR #:       [id number] Study Date: 06/28/2017 Gender:     M Age:        40 Height:     182.9 cm Weight:     67.7 kg BSA:        1.85 m^2 Pt. Status: Room:   ADMITTING    Donato Schultz, M.D.  ATTENDING    Donato Schultz, M.D.  PERFORMING   Donato Schultz, M.D.  Janalee Dane  REFERRING    Norma Fredrickson C  SONOGRAPHER  Thurman Coyer  cc:  ------------------------------------------------------------------- LV EF: 60% -   65%  ------------------------------------------------------------------- Indications:      Mitral valve prolapse 424.0.  Mitral regurgitation  424.0.  ------------------------------------------------------------------- History:   Risk factors:  Current tobacco use.  ------------------------------------------------------------------- Study Conclusions  - Left ventricle: The cavity size was normal. Wall thickness was   normal. Systolic function was normal. The estimated ejection   fraction was in the range of 60% to 65%. - Aortic valve: No evidence of vegetation. - Mitral valve: Severe prolapse, involving the posterior leaflet.   No evidence of vegetation. There was severe regurgitation   directed eccentrically wrapping around left atrium. - Left atrium: The atrium was mildly dilated. No evidence of   thrombus in the appendage. - Atrial septum: No defect or patent foramen ovale was identified.  Recommendations:  Will refer to [facility] for mitral valve repair evaluation.  ------------------------------------------------------------------- Study data:   Study status:  Routine.  Consent:  The risks, benefits, and alternatives to the procedure were explained to the patient and informed consent was obtained.  Procedure:  The patient reported no pain pre or post test. Initial setup. The patient was brought to the laboratory. Surface ECG leads were monitored. Sedation. Conscious sedation was administered by anesthesiology staff. Transesophageal echocardiography. Topical anesthesia was obtained using viscous lidocaine. A transesophageal probe was inserted by the attending cardiologist. Image quality was adequate.  Study completion:  The patient tolerated the procedure well. There were no complications.          Diagnostic transesophageal echocardiography.  2D and color Doppler.  Birthdate:  Patient birthdate: 1976/08/10.  Age:  Patient is 41 yr old.  Sex:  Gender: male.    BMI: 20.3 kg/m^2.  Blood pressure:     139/102  Patient status:  Inpatient.  Study date:  Study date: 06/28/2017. Study time: 08:21 AM.  Location:   Endoscopy.  -------------------------------------------------------------------  ------------------------------------------------------------------- Left ventricle:  The cavity size was normal. Wall thickness was normal. Systolic function was normal. The estimated ejection fraction was in the range of 60% to 65%.  ------------------------------------------------------------------- Aortic valve:   Structurally normal valve.   Cusp separation was normal.  No evidence of vegetation.  Doppler:  There was no regurgitation.  ------------------------------------------------------------------- Aorta:  The aorta was normal, not dilated, and non-diseased.  ------------------------------------------------------------------- Mitral valve:  Leaflet separation was normal.  Severe prolapse, involving the posterior leaflet.  No evidence of vegetation. Doppler:  There was severe regurgitation directed eccentrically wrapping around left atrium.  ------------------------------------------------------------------- Left atrium:  The atrium was mildly dilated.  No evidence of thrombus in the appendage. The appendage was of normal size. Emptying velocity was normal.  ------------------------------------------------------------------- Atrial septum:  No defect or patent foramen ovale was identified. Echo contrast study showed no right-to-left atrial level shunt, following an increase in RA pressure induced by provocative maneuvers.  ------------------------------------------------------------------- Right ventricle:  The cavity size was normal. Wall thickness was normal. Systolic function was normal.  ------------------------------------------------------------------- Pulmonic valve:    Structurally normal valve.   Cusp separation was normal.  No evidence of vegetation.  ------------------------------------------------------------------- Tricuspid valve:   Structurally normal valve.    Leaflet separation was normal.  No evidence of vegetation.  Doppler:  There was no regurgitation.  ------------------------------------------------------------------- Pulmonary artery:   The main pulmonary artery was normal-sized.  ------------------------------------------------------------------- Right atrium:  The atrium was normal in size.  No evidence of thrombus in the atrial cavity or appendage.  ------------------------------------------------------------------- Pericardium:  The pericardium was normal in appearance. There was no pericardial effusion.  ------------------------------------------------------------------- Systemic veins: Superior vena cava: The study excluded a thrombus.  ------------------------------------------------------------------- Post procedure conclusions Ascending Aorta:  - The aorta was normal, not dilated, and non-diseased.  ------------------------------------------------------------------- Prepared and Electronically Authenticated by  Donato Schultz, M.D. 2018-10-30T09:24:29   Impression:  Patient has mitral valve prolapse with stage D severe symptomatic primary mitral regurgitation.  He describes symptoms of exertional shortness of breath and chest pain consistent with chronic diastolic congestive heart failure, New York Heart Association functional class I-II.  The patient also had a recent syncopal episode of unclear etiology.  I have personally reviewed the patient's recent transthoracic and transesophageal echocardiograms.  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  There are multiple elongated chordae tendinae with severe mitral regurgitation that courses anteriorly around the left atrium.  Left ventricular systolic function remains normal.  I agree the patient needs elective mitral valve repair.   Plan:  The patient was counseled at  length regarding the indications, risks and potential benefits of mitral valve repair.  The rationale for elective surgery has been explained, including a comparison between surgery and continued medical therapy with close follow-up.  The likelihood of successful and durable valve repair has been discussed with particular reference to the findings of their recent echocardiogram.  Based upon these findings and previous experience, I have quoted them a greater than 95 percent likelihood of successful valve repair.  Alternative surgical approaches have been discussed including a comparison between conventional sternotomy and minimally-invasive techniques.  Expectations for his postoperative convalescence have been discussed.  The patient is eager to proceed with surgery in the near future.  I have instructed the patient to stop smoking altogether.  As a next step he will need to undergo left and right heart catheterization.  In the absence of significant coronary artery disease he will subsequently undergo CT angiography to evaluate the feasibility of peripheral arterial cannulation for surgery.  We tentatively plan to proceed with minimally invasive mitral valve repair on August 02, 2017.  The patient will return to our office for follow-up prior to surgery on August 01, 2017.  I have  suggested that it would be wise for his wife to come for his follow-up visit at that time.  All questions answered.   I spent in excess of 90 minutes during the conduct of this office consultation and >50% of this time involved direct face-to-face encounter with the patient for counseling and/or coordination of their care.    Salvatore Decent. Cornelius Moras, MD 07/04/2017 6:23 PM

## 2017-07-04 NOTE — Patient Instructions (Signed)
Continue all previous medications without any changes at this time  Stop smoking immediately and permanently.  

## 2017-07-05 ENCOUNTER — Telehealth: Payer: Self-pay

## 2017-07-05 DIAGNOSIS — I340 Nonrheumatic mitral (valve) insufficiency: Secondary | ICD-10-CM

## 2017-07-05 NOTE — Telephone Encounter (Signed)
-----   Message from Tonny BollmanMichael Cooper, MD sent at 07/04/2017  9:54 PM EST ----- Will do ----- Message ----- From: Purcell Nailswen, Clarence H, MD Sent: 07/04/2017   6:43 PM To: Iona CoachLauren W Brown, RN, Tonny BollmanMichael Cooper, MD  Kathlene NovemberMike - can you schedule this patient for cath some time in the next few weeks?  He needs mitral valve repair which is scheduled for 12/4 - thanks.  Cub

## 2017-07-05 NOTE — Telephone Encounter (Signed)
Spoke with patient's wife (DPR). R and L heart catheterization scheduled 11/23 with Dr. Excell Seltzerooper (arranged by Clydie BraunKaren in the lab). Patient will come 11/16 for lab work and to pick up instruction letter. DPR agrees with treatment plan.

## 2017-07-11 ENCOUNTER — Telehealth: Payer: Self-pay | Admitting: Cardiology

## 2017-07-11 NOTE — Telephone Encounter (Signed)
Attempted to call mother (On DPR) however phone number listed in message is incorrect.  Requested [place] verify phone note to reach mother.

## 2017-07-11 NOTE — Telephone Encounter (Signed)
New Message  Pt mother call requesting to speak with RN about cath procedure. Please call back to discuss

## 2017-07-12 NOTE — Telephone Encounter (Signed)
Called and spoke with patient who requests I call his mother at [phone].

## 2017-07-12 NOTE — Telephone Encounter (Signed)
Spoke with mother who is [place].  She is requesting specific information r/t upcoming surgical procedure scheduled with Dr Cornelius Moraswen.  Advised she will need to call Dr Cornelius Moraswen' s office for further information.  She states understanding and will call them.

## 2017-07-15 ENCOUNTER — Other Ambulatory Visit: Payer: Self-pay

## 2017-07-18 ENCOUNTER — Other Ambulatory Visit: Payer: Self-pay

## 2017-07-19 ENCOUNTER — Telehealth: Payer: Self-pay

## 2017-07-19 NOTE — Telephone Encounter (Signed)
Pt coming in today for lab work and to pick up instruction letter.  Will speak to Pt when he arrives. Lab alerted of Pt coming today.  Notified check in to be on look out for Pt, Pt needs cath instruction letter. Will cont to monitor.

## 2017-07-22 ENCOUNTER — Ambulatory Visit (HOSPITAL_COMMUNITY)
Admission: RE | Admit: 2017-07-22 | Discharge: 2017-07-22 | Disposition: A | Discharge status: Home / Self Care | Payer: Medicaid Other | Source: Ambulatory Visit | Attending: Thoracic Surgery (Cardiothoracic Vascular Surgery) | Admitting: Thoracic Surgery (Cardiothoracic Vascular Surgery)

## 2017-07-22 ENCOUNTER — Other Ambulatory Visit: Payer: Self-pay

## 2017-07-22 ENCOUNTER — Encounter (HOSPITAL_COMMUNITY)
Admission: RE | Disposition: A | Discharge status: Home / Self Care | Payer: Self-pay | Source: Ambulatory Visit | Attending: Thoracic Surgery (Cardiothoracic Vascular Surgery)

## 2017-07-22 ENCOUNTER — Ambulatory Visit (HOSPITAL_COMMUNITY): Admission: RE | Admit: 2017-07-22 | Payer: Self-pay | Source: Ambulatory Visit | Admitting: Cardiovascular Disease

## 2017-07-22 ENCOUNTER — Telehealth: Payer: Self-pay | Admitting: Physician Assistant

## 2017-07-22 ENCOUNTER — Encounter (HOSPITAL_COMMUNITY): Payer: Self-pay | Admitting: *Deleted

## 2017-07-22 ENCOUNTER — Emergency Department (HOSPITAL_COMMUNITY)
Admission: EM | Admit: 2017-07-22 | Discharge: 2017-07-22 | Disposition: A | Discharge status: Home / Self Care | Payer: Medicaid Other | Attending: Emergency Medicine | Admitting: Emergency Medicine

## 2017-07-22 DIAGNOSIS — F17210 Nicotine dependence, cigarettes, uncomplicated: Secondary | ICD-10-CM | POA: Insufficient documentation

## 2017-07-22 DIAGNOSIS — Z91040 Latex allergy status: Secondary | ICD-10-CM | POA: Diagnosis not present

## 2017-07-22 DIAGNOSIS — I340 Nonrheumatic mitral (valve) insufficiency: Secondary | ICD-10-CM | POA: Diagnosis present

## 2017-07-22 DIAGNOSIS — M79601 Pain in right arm: Secondary | ICD-10-CM | POA: Insufficient documentation

## 2017-07-22 DIAGNOSIS — F909 Attention-deficit hyperactivity disorder, unspecified type: Secondary | ICD-10-CM | POA: Insufficient documentation

## 2017-07-22 HISTORY — PX: RIGHT/LEFT HEART CATH AND CORONARY ANGIOGRAPHY: CATH118266

## 2017-07-22 LAB — POCT I-STAT 3, VENOUS BLOOD GAS (G3P V)
ACID-BASE DEFICIT: 1 mmol/L (ref 0.0–2.0)
Acid-Base Excess: 1 mmol/L (ref 0.0–2.0)
BICARBONATE: 26.6 mmol/L (ref 20.0–28.0)
Bicarbonate: 25.1 mmol/L (ref 20.0–28.0)
O2 SAT: 72 %
O2 SAT: 75 %
PCO2 VEN: 44.3 mmHg (ref 44.0–60.0)
TCO2: 26 mmol/L (ref 22–32)
TCO2: 28 mmol/L (ref 22–32)
pCO2, Ven: 46.4 mmHg (ref 44.0–60.0)
pH, Ven: 7.341 (ref 7.250–7.430)
pH, Ven: 7.386 (ref 7.250–7.430)
pO2, Ven: 39 mmHg (ref 32.0–45.0)
pO2, Ven: 43 mmHg (ref 32.0–45.0)

## 2017-07-22 LAB — POCT I-STAT 3, ART BLOOD GAS (G3+)
ACID-BASE DEFICIT: 2 mmol/L (ref 0.0–2.0)
BICARBONATE: 24.6 mmol/L (ref 20.0–28.0)
O2 SAT: 97 %
PO2 ART: 104 mmHg (ref 83.0–108.0)
TCO2: 26 mmol/L (ref 22–32)
pCO2 arterial: 47.3 mmHg (ref 32.0–48.0)
pH, Arterial: 7.324 — ABNORMAL LOW (ref 7.350–7.450)

## 2017-07-22 LAB — CBC
HCT: 43.7 % (ref 39.0–52.0)
Hemoglobin: 15.1 g/dL (ref 13.0–17.0)
MCH: 31.8 pg (ref 26.0–34.0)
MCHC: 34.6 g/dL (ref 30.0–36.0)
MCV: 92 fL (ref 78.0–100.0)
PLATELETS: 282 10*3/uL (ref 150–400)
RBC: 4.75 MIL/uL (ref 4.22–5.81)
RDW: 14.1 % (ref 11.5–15.5)
WBC: 10.2 10*3/uL (ref 4.0–10.5)

## 2017-07-22 LAB — BASIC METABOLIC PANEL
ANION GAP: 7 (ref 5–15)
BUN: 10 mg/dL (ref 6–20)
CHLORIDE: 106 mmol/L (ref 101–111)
CO2: 26 mmol/L (ref 22–32)
CREATININE: 0.86 mg/dL (ref 0.61–1.24)
Calcium: 9.1 mg/dL (ref 8.9–10.3)
Glucose, Bld: 84 mg/dL (ref 65–99)
POTASSIUM: 3.5 mmol/L (ref 3.5–5.1)
SODIUM: 139 mmol/L (ref 135–145)

## 2017-07-22 LAB — PROTIME-INR
INR: 0.87
Prothrombin Time: 11.8 seconds (ref 11.4–15.2)

## 2017-07-22 SURGERY — RIGHT/LEFT HEART CATH AND CORONARY ANGIOGRAPHY
Anesthesia: LOCAL

## 2017-07-22 MED ORDER — MIDAZOLAM HCL 2 MG/2ML IJ SOLN
INTRAMUSCULAR | Status: AC
  Filled 2017-07-22: qty 2

## 2017-07-22 MED ORDER — FENTANYL CITRATE (PF) 100 MCG/2ML IJ SOLN
INTRAMUSCULAR | Status: AC
  Filled 2017-07-22: qty 2

## 2017-07-22 MED ORDER — SODIUM CHLORIDE 0.9% FLUSH: 3.0000 mL | Freq: Two times a day (BID) | INTRAVENOUS | Status: DC

## 2017-07-22 MED ORDER — OXYCODONE HCL 5 MG PO TABS
ORAL_TABLET | ORAL | Status: AC
  Filled 2017-07-22: qty 2

## 2017-07-22 MED ORDER — FENTANYL CITRATE (PF) 100 MCG/2ML IJ SOLN
INTRAMUSCULAR | Status: DC | PRN
  Administered 2017-07-22 (×3): 25 ug via INTRAVENOUS

## 2017-07-22 MED ORDER — HEPARIN (PORCINE) IN NACL 2-0.9 UNIT/ML-% IJ SOLN
INTRAMUSCULAR | Status: AC | PRN
  Administered 2017-07-22: 1000 mL

## 2017-07-22 MED ORDER — SODIUM CHLORIDE 0.9% FLUSH: 3.0000 mL | INTRAVENOUS | Status: DC | PRN

## 2017-07-22 MED ORDER — SODIUM CHLORIDE 0.9 % WEIGHT BASED INFUSION
3.0000 mL/kg/h | INTRAVENOUS | Status: DC
  Administered 2017-07-22: 3 mL/kg/h via INTRAVENOUS

## 2017-07-22 MED ORDER — ASPIRIN 81 MG PO CHEW
81.0000 mg | CHEWABLE_TABLET | ORAL | Status: DC
  Administered 2017-07-22: 81 mg via ORAL
  Filled 2017-07-22: qty 1

## 2017-07-22 MED ORDER — IOPAMIDOL (ISOVUE-370) INJECTION 76%
INTRAVENOUS | Status: DC | PRN
  Administered 2017-07-22: 60 mL via INTRA_ARTERIAL

## 2017-07-22 MED ORDER — SODIUM CHLORIDE 0.9 % WEIGHT BASED INFUSION: 1.0000 mL/kg/h | INTRAVENOUS | Status: DC

## 2017-07-22 MED ORDER — LIDOCAINE HCL (PF) 1 % IJ SOLN
INTRAMUSCULAR | Status: DC | PRN
  Administered 2017-07-22 (×2): 2 mL

## 2017-07-22 MED ORDER — VERAPAMIL HCL 2.5 MG/ML IV SOLN
INTRAVENOUS | Status: DC | PRN
  Administered 2017-07-22: 10 mL via INTRA_ARTERIAL

## 2017-07-22 MED ORDER — METHOCARBAMOL 750 MG PO TABS: 750.0000 mg | ORAL_TABLET | Freq: Four times a day (QID) | ORAL | 0 refills | Status: DC

## 2017-07-22 MED ORDER — HEPARIN (PORCINE) IN NACL 2-0.9 UNIT/ML-% IJ SOLN
INTRAMUSCULAR | Status: AC
  Filled 2017-07-22: qty 500

## 2017-07-22 MED ORDER — HEPARIN SODIUM (PORCINE) 1000 UNIT/ML IJ SOLN
INTRAMUSCULAR | Status: DC | PRN
  Administered 2017-07-22: 4000 [IU] via INTRAVENOUS

## 2017-07-22 MED ORDER — ASPIRIN 81 MG PO CHEW
CHEWABLE_TABLET | ORAL | Status: AC
  Administered 2017-07-22: 81 mg via ORAL
  Filled 2017-07-22: qty 1

## 2017-07-22 MED ORDER — HEPARIN SODIUM (PORCINE) 1000 UNIT/ML IJ SOLN
INTRAMUSCULAR | Status: AC
  Filled 2017-07-22: qty 1

## 2017-07-22 MED ORDER — IOPAMIDOL (ISOVUE-370) INJECTION 76%
INTRAVENOUS | Status: AC
  Filled 2017-07-22: qty 100

## 2017-07-22 MED ORDER — DIAZEPAM 5 MG PO TABS: 5.0000 mg | ORAL_TABLET | Freq: Four times a day (QID) | ORAL | Status: DC | PRN

## 2017-07-22 MED ORDER — OXYCODONE-ACETAMINOPHEN 5-325 MG PO TABS
2.0000 | ORAL_TABLET | Freq: Once | ORAL | Status: AC
  Administered 2017-07-22: 2 via ORAL
  Filled 2017-07-22: qty 2

## 2017-07-22 MED ORDER — OXYCODONE HCL 5 MG PO TABS
5.0000 mg | ORAL_TABLET | ORAL | Status: DC | PRN
  Administered 2017-07-22: 10 mg via ORAL

## 2017-07-22 MED ORDER — SODIUM CHLORIDE 0.9 % IV SOLN: 250.0000 mL | INTRAVENOUS | Status: DC | PRN

## 2017-07-22 MED ORDER — OXYCODONE-ACETAMINOPHEN 5-325 MG PO TABS: 2.0000 | ORAL_TABLET | ORAL | 0 refills | Status: DC | PRN

## 2017-07-22 MED ORDER — MIDAZOLAM HCL 2 MG/2ML IJ SOLN
INTRAMUSCULAR | Status: DC | PRN
  Administered 2017-07-22 (×3): 2 mg via INTRAVENOUS

## 2017-07-22 MED ORDER — VERAPAMIL HCL 2.5 MG/ML IV SOLN
INTRAVENOUS | Status: AC
  Filled 2017-07-22: qty 2

## 2017-07-22 SURGICAL SUPPLY — 13 items
CATH 5FR JL3.5 JR4 ANG PIG MP (CATHETERS) ×1 IMPLANT
CATH BALLN WEDGE 5F 110CM (CATHETERS) ×1 IMPLANT
CATH INFINITI 5 FR JR5 (CATHETERS) ×1 IMPLANT
DEVICE RAD COMP TR BAND LRG (VASCULAR PRODUCTS) ×1 IMPLANT
GLIDESHEATH SLEND SS 6F .021 (SHEATH) ×1 IMPLANT
GUIDEWIRE INQWIRE 1.5J.035X260 (WIRE) IMPLANT
INQWIRE 1.5J .035X260CM (WIRE) ×2
KIT HEART LEFT (KITS) ×2 IMPLANT
PACK CARDIAC CATHETERIZATION (CUSTOM PROCEDURE TRAY) ×2 IMPLANT
SHEATH GLIDE SLENDER 4/5FR (SHEATH) ×1 IMPLANT
TRANSDUCER W/STOPCOCK (MISCELLANEOUS) ×2 IMPLANT
TUBING CIL FLEX 10 FLL-RA (TUBING) ×2 IMPLANT
WIRE MICROINTRODUCER 60CM (WIRE) ×1 IMPLANT

## 2017-07-22 NOTE — Progress Notes (Signed)
Brachial sheath removed.  Pressure held for 5 minutes site unremarkable.  Gauze with coband applied

## 2017-07-22 NOTE — ED Provider Notes (Signed)
[name] Provider Note   CSN: [id number] Arrival date & time: 07/22/17  [id number]     History   Chief Complaint Chief Complaint  Patient presents with  . Arm Pain    HPI Steven FinancialDonnie R Shaver Jr. is a 41 y.o. male.  41 year old male presents with exquisite pain to his right upper extremity after having a right radial artery heart catheterization done earlier today.  Denies any swelling to his right hand.  Has had some paresthesias of his right hand but denies any issues with muscle use.  He denies any wrist drop.  Pain is exquisite and worse with any movement.  He was not prescribed any pain medication for this.      Past Medical History:  Diagnosis Date  . Chronic urethral stricture   . Diverticulitis   . Frequency of urination   . Headache(784.0)   . Lower abdominal pain   . Mitral valve prolapse   . Nocturia   . Urgency of urination   . Urinary straining     Patient Active Problem List   Diagnosis Date Noted  . MVP (mitral valve prolapse) 10/02/2016  . Non-rheumatic mitral regurgitation 10/02/2016  . Smoker 10/02/2016  . 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 Depressive disorder 12/31/2015  . Rectal bleeding 07/25/2013    Past Surgical History:  Procedure Laterality Date  . COLONOSCOPY N/A 08/17/2013   Procedure: COLONOSCOPY;  Surgeon: Romie LeveeAlicia Thomas, MD;  Location: [facility];  Service: Endoscopy;  Laterality: N/A;  . CYSTO/ RETROGRADE PYELOGRAM/ URETHRAL DILATION  11-05-2010  . TEE WITHOUT CARDIOVERSION N/A 06/28/2017   Procedure: TRANSESOPHAGEAL ECHOCARDIOGRAM (TEE);  Surgeon: Jake BatheSkains, Mark C, MD;  [facility];  Service: Cardiovascular;  Laterality: N/A;  . TOOTH EXTRACTION     canines       Home Medications    Prior to Admission medications   Medication Sig Start Date End Date Taking? Authorizing Provider  acetaminophen (TYLENOL) 500 MG tablet Take 1,000 mg by  mouth every 6 (six) hours as needed for mild pain.   Yes [provider]    Family History Family History  Problem Relation Age of Onset  . Heart disease Father     Social History Social History   Tobacco Use  . Smoking status: Current Every Day Smoker    Packs/day: 0.50    Years: 4.00    Pack years: 2.00    Types: Cigarettes  . Smokeless tobacco: Current User    Types: Chew, Snuff  Substance Use Topics  . Alcohol use: No    Alcohol/week: 100.8 oz    Types: 168 Cans of beer per week    Comment: Using for 25 years  . Drug use: No    Comment: pt is in jail     Allergies   Latex and Adhesive [tape]   Review of Systems Review of Systems  All other systems reviewed and are negative.    Physical Exam Updated Vital Signs BP (!) 150/102   Pulse 94   Temp 98.3 F (36.8 C) (Oral)   Resp 16   Ht 1.829 m (6')   Wt 68 kg (150 lb)   SpO2 95%   BMI 20.34 kg/m   Physical Exam  Constitutional: He is oriented to person, place, and time. He appears well-developed and well-nourished.  Non-toxic appearance. No distress.  HENT:  Head: Normocephalic and atraumatic.  Eyes: Conjunctivae, EOM and lids are normal. Pupils are equal,  round, and reactive to light.  Neck: Normal range of motion. Neck supple. No tracheal deviation present. No thyroid mass present.  Cardiovascular: Normal rate, regular rhythm and normal heart sounds. Exam reveals no gallop.  No murmur heard. Pulmonary/Chest: Effort normal and breath sounds normal. No stridor. No respiratory distress. He has no decreased breath sounds. He has no wheezes. He has no rhonchi. He has no rales.  Abdominal: Soft. Normal appearance and bowel sounds are normal. He exhibits no distension. There is no tenderness. There is no rebound and no CVA tenderness.  Musculoskeletal: Normal range of motion. He exhibits no edema or tenderness.  Patient's compartments are soft in the right upper right lower extremity.  Radial pulses  palpable without thrill.  He has good sensation in his digits on the right hand.  No discoloration noted.  Skin is warm to the touch.  Neurological: He is alert and oriented to person, place, and time. He has normal strength. No cranial nerve deficit or sensory deficit. GCS eye subscore is 4. GCS verbal subscore is 5. GCS motor subscore is 6.  Skin: Skin is warm and dry. No abrasion and no rash noted.  Psychiatric: He has a normal mood and affect. His speech is normal and behavior is normal.  Nursing note and vitals reviewed.    ED Treatments / Results  Labs (all labs ordered are listed, but only abnormal results are displayed) Labs Reviewed - No data to display  EKG  EKG Interpretation None       Radiology No results found.  Procedures Procedures (including critical care time)  Medications Ordered in ED Medications  oxyCODONE-acetaminophen (PERCOCET/ROXICET) 5-325 MG per tablet 2 tablet (not administered)     Initial Impression / Assessment and Plan / ED Course  I have reviewed the triage vital signs and the nursing notes.  Pertinent labs & imaging results that were available during my care of the patient were reviewed by me and considered in my medical decision making (see chart for details).     Patient medicated for pain here with Percocet.  Has been seen by the cardiology fellow who left a note.  Agrees that there is no evidence of acute vascular compromise or compartment syndrome.  Strict return precautions given and will prescribe patient a short course of opiates as well as muscle relaxants  Final Clinical Impressions(s) / ED Diagnoses   Final diagnoses:  None    ED Discharge Orders    None       Lorre NickAllen, Sumayah Bearse, MD 07/22/17 2259

## 2017-07-22 NOTE — Interval H&P Note (Signed)
History and Physical Interval Note:  07/22/2017 10:46 AM  Steven R Fasnacht Jr.  has presented today for surgery, with the diagnosis of mr  The various methods of treatment have been discussed with the patient and family. After consideration of risks, benefits and other options for treatment, the patient has consented to  Procedure(s): RIGHT/LEFT HEART CATH AND CORONARY ANGIOGRAPHY (N/A) as a surgical intervention .  The patient's history has been reviewed, patient examined, no change in status, stable for surgery.  I have reviewed the patient's chart and labs.  Questions were answered to the patient's satisfaction.     [name]

## 2017-07-22 NOTE — Telephone Encounter (Signed)
Patient called because he had a heart catheterization today and his right arm and hand are very painful.  He is having trouble moving them.  There is also swelling.  It is very painful, there is no numbness.  Tylenol has not helped.   Advised the patient that he could have radial artery spasm from the heart catheterization and this would need to be treated appropriately.  Stated that he needed to come to the emergency room for evaluation.  Agreed with him that if his pain was coming from spasm in the radial artery, Tylenol would not help.  Patient agrees and will come to the ER.  Steven Demarkhonda Royale Lennartz, PA-C 07/22/2017 6:13 PM Beeper [phone]

## 2017-07-22 NOTE — Discharge Instructions (Signed)
Follow-up with your cardiologist as he has been instructed.  Return here once for swelling to your right arm, discoloration to your right hand, not be able to move your right hand, or any other problems

## 2017-07-22 NOTE — ED Triage Notes (Addendum)
The pt had a cardiac cath earlier today  He left 1400 from here  He is c/o pain  In his entire rt arm  The arm is cold but he has a good radial pulse  His injection site was in his rt artery.  He is c/o much pain.  His rt arm is swollen also

## 2017-07-22 NOTE — Discharge Instructions (Signed)

## 2017-07-22 NOTE — Consult Note (Signed)
Reason for Consult: arm pain   Requesting Physician/Service: ED Zenia Resides)   PCP:  Patient, No Pcp Per Primary Cardiologist: skains   HPI:  41 YO male with history of tobacco use and previous drug/etoh use and severe MR with plans for operative repair with Dr. Roxy Manns in a few weeks.  He underwent pre operative left and right heart catheterization with Dr. Burt Knack today.  Coronaries without blockades, nl RV pressures.  He says he was fine while being observed post cath, but when he got home, he experienced severe pain in his arm.  He called in and was advised to come in to be checked out.  No muscle weakness, sensory changes, just pain on palpation and movement.  No swelling in forearm/hand.   Previous Cardiac Studies:  Cath 07/22/17 Conclusion   1.  Angiographically normal coronary arteries 2.  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 Normal right heart/PCWP pressures     Past Medical History:  Diagnosis Date  . Chronic urethral stricture   . Diverticulitis   . Frequency of urination   . Headache(784.0)   . Lower abdominal pain   . Mitral valve prolapse   . Nocturia   . Urgency of urination   . Urinary straining     Past Surgical History:  Procedure Laterality Date  . COLONOSCOPY N/A 08/17/2013   Procedure: COLONOSCOPY;  Surgeon: Leighton Ruff, MD;  Location: [facility];  Service: Endoscopy;  Laterality: N/A;  . CYSTO/ RETROGRADE PYELOGRAM/ URETHRAL DILATION  11-05-2010  . TEE WITHOUT CARDIOVERSION N/A 06/28/2017   Procedure: TRANSESOPHAGEAL ECHOCARDIOGRAM (TEE);  Surgeon: Jerline Pain, MD;  Location: [facility] ENDOSCOPY;  Service: Cardiovascular;  Laterality: N/A;  . TOOTH EXTRACTION     canines    Family History  Problem Relation Age of Onset  . Heart disease Father    Social History:  reports that he has been smoking cigarettes.  He has a 2.00 pack-year smoking history. His smokeless tobacco  use includes chew and snuff. He reports that he does not drink alcohol or use drugs.  Allergies:  Allergies  Allergen Reactions  . Latex Other (See Comments)    HX SUPERFICIAL THROMBOPHLEBITIS LEFT FOREARM AFTER IV IN APR 2011  . Adhesive [Tape] Rash    No current facility-administered medications on file prior to encounter.    Current Outpatient Medications on File Prior to Encounter  Medication Sig Dispense Refill  . acetaminophen (TYLENOL) 500 MG tablet Take 1,000 mg by mouth every 6 (six) hours as needed for mild pain.      Results for orders placed or performed during the hospital encounter of 07/22/17 (from the past 48 hour(s))  Basic metabolic panel     Status: None   Collection Time: 07/22/17  9:27 AM  Result Value Ref Range   Sodium 139 135 - 145 mmol/L   Potassium 3.5 3.5 - 5.1 mmol/L   Chloride 106 101 - 111 mmol/L   CO2 26 22 - 32 mmol/L   Glucose, Bld 84 65 - 99 mg/dL   BUN 10 6 - 20 mg/dL   Creatinine, Ser 0.86 0.61 - 1.24 mg/dL   Calcium 9.1 8.9 - 10.3 mg/dL   GFR calc non Af Amer >60 >60 mL/min   GFR calc Af Amer >60 >60 mL/min    Comment: (NOTE) The eGFR has been calculated using the CKD EPI equation. This calculation has not  been validated in all clinical situations. eGFR's persistently <60 mL/min signify possible Chronic Kidney Disease.    Anion gap 7 5 - 15  CBC     Status: None   Collection Time: 07/22/17  9:27 AM  Result Value Ref Range   WBC 10.2 4.0 - 10.5 K/uL   RBC 4.75 4.22 - 5.81 MIL/uL   Hemoglobin 15.1 13.0 - 17.0 g/dL   HCT 43.7 39.0 - 52.0 %   MCV 92.0 78.0 - 100.0 fL   MCH 31.8 26.0 - 34.0 pg   MCHC 34.6 30.0 - 36.0 g/dL   RDW 14.1 11.5 - 15.5 %   Platelets 282 150 - 400 K/uL  Protime-INR     Status: None   Collection Time: 07/22/17  9:27 AM  Result Value Ref Range   Prothrombin Time 11.8 11.4 - 15.2 seconds   INR 0.87   I-STAT 3, venous blood gas (G3P V)     Status: None   Collection Time: 07/22/17 11:10 AM  Result Value Ref  Range   pH, Ven 7.386 7.250 - 7.430   pCO2, Ven 44.3 44.0 - 60.0 mmHg   pO2, Ven 39.0 32.0 - 45.0 mmHg   Bicarbonate 26.6 20.0 - 28.0 mmol/L   TCO2 28 22 - 32 mmol/L   O2 Saturation 72.0 %   Acid-Base Excess 1.0 0.0 - 2.0 mmol/L   Patient temperature HIDE    Sample type MIXED VENOUS SAMPLE    Comment NOTIFIED PHYSICIAN   I-STAT 3, venous blood gas (G3P V)     Status: None   Collection Time: 07/22/17 11:25 AM  Result Value Ref Range   pH, Ven 7.341 7.250 - 7.430   pCO2, Ven 46.4 44.0 - 60.0 mmHg   pO2, Ven 43.0 32.0 - 45.0 mmHg   Bicarbonate 25.1 20.0 - 28.0 mmol/L   TCO2 26 22 - 32 mmol/L   O2 Saturation 75.0 %   Acid-base deficit 1.0 0.0 - 2.0 mmol/L   Patient temperature HIDE    Sample type VENOUS   I-STAT 3, arterial blood gas (G3+)     Status: Abnormal   Collection Time: 07/22/17 11:26 AM  Result Value Ref Range   pH, Arterial 7.324 (L) 7.350 - 7.450   pCO2 arterial 47.3 32.0 - 48.0 mmHg   pO2, Arterial 104.0 83.0 - 108.0 mmHg   Bicarbonate 24.6 20.0 - 28.0 mmol/L   TCO2 26 22 - 32 mmol/L   O2 Saturation 97.0 %   Acid-base deficit 2.0 0.0 - 2.0 mmol/L   Patient temperature HIDE    Sample type ARTERIAL    No results found.   ROS: As above. Otherwise, review of systems is negative unless per above HPI  Vitals:   07/22/17 1857 07/22/17 1903 07/22/17 2230 07/22/17 2245  BP: (!) 150/102  123/89 124/90  Pulse: 94  79 65  Resp: 16  (!) 22 14  Temp: 98.3 F (36.8 C)     TempSrc: Oral     SpO2: 95%  100% 99%  Weight:  68 kg (150 lb)    Height:  6' (1.829 m)     Wt Readings from Last 10 Encounters:  07/22/17 68 kg (150 lb)  07/22/17 68 kg (150 lb)  07/04/17 68 kg (150 lb)  06/28/17 67.6 kg (149 lb)  06/22/17 67.6 kg (149 lb)  06/06/17 66.7 kg (147 lb)  01/13/17 72.6 kg (160 lb)  12/31/15 64 kg (141 lb 1.6 oz)  12/06/15 64.1 kg (141 lb  4 oz)  08/21/15 72.6 kg (160 lb)    PE:  General: No acute distress HEENT: Atraumatic, EOMI, mucous membranes moist. No  JVD at 45 degrees.  CV: SEM, 4/6 SEM no gallops.  Respiratory: Clear, no crackles. Normal work of breathing ABD: Non-distended and non-tender. No palpable organomegaly.  Extremities: 2+ radial pulses bilaterally. no edema. Pain right wrist on palpation.  No swelling or tight compartments.   Neuro/Psych: CN grossly intact, alert and oriented  Assessment/Plan Right wrist pain post radial access.  Cath notes state initially some difficulty wiring right radial artery, then successful on next attempt with ultrasound.  No concerning features on exam or history.  Pain seems somewhat out of proportion to physical exam.  Great distal radial pulse distal to sheath insertion.  No signs of bruising/hematoma or compartment syndrome.  Possible that he has some spasm in the right radial artery.  I advised him on high risk features which would require him to return to be checked out.  ED to prescribe very short course of pain medications.  He knows to be seen or call back should this worsen.   Lolita Cram Gayland Nicol  MD 07/22/2017, 11:14 PM

## 2017-07-25 ENCOUNTER — Encounter (HOSPITAL_COMMUNITY): Payer: Self-pay | Admitting: Cardiovascular Disease

## 2017-07-29 ENCOUNTER — Telehealth: Payer: Self-pay

## 2017-07-29 ENCOUNTER — Encounter (HOSPITAL_COMMUNITY): Payer: Self-pay

## 2017-07-29 ENCOUNTER — Inpatient Hospital Stay (HOSPITAL_COMMUNITY)
Admission: RE | Admit: 2017-07-29 | Discharge: 2017-07-29 | Disposition: A | Discharge status: Home / Self Care | Payer: Self-pay | Source: Ambulatory Visit

## 2017-07-29 ENCOUNTER — Ambulatory Visit (HOSPITAL_COMMUNITY): Admission: RE | Admit: 2017-07-29 | Payer: Self-pay | Source: Ambulatory Visit

## 2017-07-29 NOTE — Pre-Procedure Instructions (Signed)
Steven Boreronnie R Bonello Jr.  07/29/2017      [id number] [address] [place], [place] - [phone] [facility] [address] [place] [id number] Phone: [phone] Fax: [phone]    Your procedure is scheduled on Tuesday, December 4.  [facility]30 AM                Your surgery or procedure is scheduled for 7:30 A.M.   Call this number if you have problems the morning of surgery: [phone]- pre- op desdk                For any other questions, please call [phone], Monday - Friday 8 AM - 4 PM.    Remember:  Do not eat food or drink liquids after midnight Monday, December 3.  Take these medicines the morning of surgery with A SIP OF WATER : MAY take if needed: acetaminophen (TYLENOL)  OR  oxyCODONE-acetaminophen (PERCOCET/ROXICET), methocarbamol (ROBAXIN-750).   Special instructions:   [place]- Preparing For Surgery  Before surgery, you can play an important role. Because skin is not sterile, your skin needs to be as free of germs as possible. You can reduce the number of germs on your skin by washing with CHG (chlorahexidine gluconate) Soap before surgery.  CHG is an antiseptic cleaner which kills germs and bonds with the skin to continue killing germs even after washing.  Please do not use if you have an allergy to CHG or antibacterial soaps. If your skin becomes reddened/irritated stop using the CHG.  Do not shave (including legs and underarms) for at least 48 hours prior to first CHG shower. It is OK to shave your face.  Please follow these instructions carefully.   1. Shower the NIGHT BEFORE SURGERY and the MORNING OF SURGERY with CHG.   2. If you chose to wash your hair, wash your hair first as usual with your normal shampoo.  3. After you shampoo, rinse your hair and body thoroughly to remove the shampoo.   Wash your face and private area with the soap you use at home, then rinse.  4. Use  CHG as you would any other liquid soap. You can apply CHG directly to the skin and wash gently with a scrungie or a clean washcloth.   5. Apply the CHG Soap to your body ONLY FROM THE NECK DOWN.  Do not use on open wounds or open sores. Avoid contact with your eyes, ears, mouth and genitals (private parts). Wash Face and genitals (private parts)  with your normal soap.  6. Wash thoroughly, paying special attention to the area where your surgery will be performed.  7. Thoroughly rinse your body with warm water from the neck down.  8. DO NOT shower/wash with your normal soap after using and rinsing off the CHG Soap.  9. Pat yourself dry with a CLEAN TOWEL.  10. Wear CLEAN PAJAMAS to bed the night before surgery, wear comfortable clothes the morning of surgery  11. Place CLEAN SHEETS on your bed the night of your first shower and DO NOT SLEEP WITH PETS.  Day of Surgery:  Shower as above. Do not apply any deodorants/lotions, powders or colognes. Please wear clean clothes to the hospital/surgery center.    Do not wear jewelry, make-up or nail polish.  Do not shave 48 hours prior to surgery.  Men may shave face and neck.  Do not bring valuables  to the hospital.  [facility] is not responsible for any belongings or valuables.  Contacts, dentures or bridgework may not be worn into surgery.  Leave your suitcase in the car.  After surgery it may be brought to your room.  For patients admitted to the hospital, discharge time will be determined by your treatment team.  Please read over the following fact sheets that you were given: Pain Booklet, Patient Instructions for Mupirocin Application, Incentive Spirometry, Surgical Site Infections.

## 2017-07-29 NOTE — Telephone Encounter (Signed)
Received call from [place]enny at Short Stay about Mr Steven Montoya. He has not arrived for his pre- op testing scheduled at 1 pm today. She tried to contact him but no answer. I left a VM to call our office. I will inform Dr Cornelius Moraswen. Surgery is scheduled for 08/02/17.

## 2017-07-29 NOTE — Telephone Encounter (Signed)
Left another voicemail for Mr Steven Montoya to call Dr Barry Dieneswens office about pre op appt's missed today, they need to be rescheduled. His surgery is scheduled for 08/02/17.

## 2017-08-01 ENCOUNTER — Ambulatory Visit
Admission: RE | Admit: 2017-08-01 | Discharge: 2017-08-01 | Disposition: A | Discharge status: Home / Self Care | Payer: Self-pay | Source: Ambulatory Visit | Attending: Thoracic Surgery (Cardiothoracic Vascular Surgery) | Admitting: Thoracic Surgery (Cardiothoracic Vascular Surgery)

## 2017-08-01 ENCOUNTER — Encounter: Payer: Self-pay | Admitting: Thoracic Surgery (Cardiothoracic Vascular Surgery)

## 2017-08-01 ENCOUNTER — Telehealth: Payer: Self-pay

## 2017-08-01 ENCOUNTER — Ambulatory Visit (INDEPENDENT_AMBULATORY_CARE_PROVIDER_SITE_OTHER): Payer: Medicaid Other | Admitting: Thoracic Surgery (Cardiothoracic Vascular Surgery)

## 2017-08-01 ENCOUNTER — Other Ambulatory Visit: Payer: Self-pay

## 2017-08-01 VITALS — BP 111/77 | HR 72 | Ht 72.0 in | Wt 146.0 lb

## 2017-08-01 DIAGNOSIS — I340 Nonrheumatic mitral (valve) insufficiency: Secondary | ICD-10-CM | POA: Diagnosis not present

## 2017-08-01 DIAGNOSIS — I7409 Other arterial embolism and thrombosis of abdominal aorta: Secondary | ICD-10-CM

## 2017-08-01 DIAGNOSIS — Z01818 Encounter for other preprocedural examination: Secondary | ICD-10-CM

## 2017-08-01 DIAGNOSIS — I341 Nonrheumatic mitral (valve) prolapse: Secondary | ICD-10-CM | POA: Diagnosis not present

## 2017-08-01 DIAGNOSIS — I7101 Dissection of thoracic aorta: Secondary | ICD-10-CM

## 2017-08-01 DIAGNOSIS — I71019 Dissection of thoracic aorta, unspecified: Secondary | ICD-10-CM

## 2017-08-01 MED ORDER — EPINEPHRINE PF 1 MG/ML IJ SOLN
0.0000 ug/min | INTRAVENOUS | Status: DC
  Filled 2017-08-01: qty 4

## 2017-08-01 MED ORDER — VANCOMYCIN HCL 10 G IV SOLR
1250.0000 mg | INTRAVENOUS | Status: DC
  Filled 2017-08-01: qty 1250

## 2017-08-01 MED ORDER — TRANEXAMIC ACID (OHS) PUMP PRIME SOLUTION
2.0000 mg/kg | INTRAVENOUS | Status: DC
  Filled 2017-08-01: qty 1.36

## 2017-08-01 MED ORDER — KENNESTONE BLOOD CARDIOPLEGIA (KBC) MANNITOL SYRINGE (20%, 32ML)
32.0000 mL | Freq: Once | INTRAVENOUS | Status: DC
  Filled 2017-08-01: qty 64

## 2017-08-01 MED ORDER — SODIUM CHLORIDE 0.9 % IV SOLN
INTRAVENOUS | Status: DC
  Filled 2017-08-01: qty 30

## 2017-08-01 MED ORDER — TRANEXAMIC ACID 1000 MG/10ML IV SOLN
1.5000 mg/kg/h | INTRAVENOUS | Status: DC
  Filled 2017-08-01: qty 25

## 2017-08-01 MED ORDER — SODIUM CHLORIDE 0.9 % IV SOLN
INTRAVENOUS | Status: DC
  Filled 2017-08-01: qty 1

## 2017-08-01 MED ORDER — DEXMEDETOMIDINE HCL IN NACL 400 MCG/100ML IV SOLN
0.1000 ug/kg/h | INTRAVENOUS | Status: DC
  Filled 2017-08-01: qty 100

## 2017-08-01 MED ORDER — NITROGLYCERIN IN D5W 200-5 MCG/ML-% IV SOLN
2.0000 ug/min | INTRAVENOUS | Status: DC
  Filled 2017-08-01: qty 250

## 2017-08-01 MED ORDER — MAGNESIUM SULFATE 50 % IJ SOLN
40.0000 meq | INTRAMUSCULAR | Status: DC
  Filled 2017-08-01: qty 9.85

## 2017-08-01 MED ORDER — PLASMA-LYTE 148 IV SOLN
INTRAVENOUS | Status: DC
  Filled 2017-08-01: qty 2.5

## 2017-08-01 MED ORDER — IOPAMIDOL (ISOVUE-370) INJECTION 76%
75.0000 mL | Freq: Once | INTRAVENOUS | Status: AC | PRN
  Administered 2017-08-01: 75 mL via INTRAVENOUS

## 2017-08-01 MED ORDER — PHENYLEPHRINE HCL 10 MG/ML IJ SOLN
30.0000 ug/min | INTRAMUSCULAR | Status: DC
  Filled 2017-08-01: qty 2

## 2017-08-01 MED ORDER — TRANEXAMIC ACID (OHS) BOLUS VIA INFUSION
15.0000 mg/kg | INTRAVENOUS | Status: DC
  Filled 2017-08-01: qty 1020

## 2017-08-01 MED ORDER — DOPAMINE-DEXTROSE 3.2-5 MG/ML-% IV SOLN
0.0000 ug/kg/min | INTRAVENOUS | Status: DC
  Filled 2017-08-01: qty 250

## 2017-08-01 MED ORDER — DEXTROSE 5 % IV SOLN
750.0000 mg | INTRAVENOUS | Status: DC
  Filled 2017-08-01: qty 750

## 2017-08-01 MED ORDER — POTASSIUM CHLORIDE 2 MEQ/ML IV SOLN
80.0000 meq | INTRAVENOUS | Status: DC
  Filled 2017-08-01: qty 40

## 2017-08-01 MED ORDER — KENNESTONE BLOOD CARDIOPLEGIA VIAL
13.0000 mL | Freq: Once | Status: DC
  Filled 2017-08-01: qty 26

## 2017-08-01 MED ORDER — DEXTROSE 5 % IV SOLN
1.5000 g | INTRAVENOUS | Status: DC
  Filled 2017-08-01: qty 1.5

## 2017-08-01 MED ORDER — VANCOMYCIN HCL 1000 MG IV SOLR
INTRAVENOUS | Status: DC
  Filled 2017-08-01: qty 1000

## 2017-08-01 NOTE — Patient Instructions (Addendum)
Continue all previous medications without any changes at this time  Stop smoking immediately and permanently.  

## 2017-08-01 NOTE — Telephone Encounter (Signed)
Left Voicemail message, to call Dr Orvan Julywen's office [phone] to rescheduled appointments that were missed on Friday 07/29/17. We will have to cancel surgery scheduled for tomorrow if pre-op testing is not completed today.

## 2017-08-01 NOTE — Progress Notes (Signed)
[address]       [place] [address]             [phone]     CARDIOTHORACIC SURGERY OFFICE NOTE  Referring Provider is Jake Bathe, MD PCP is Patient, No Pcp Per   HPI:  Patient is a 41 year old male who returns to the office today for follow-up of mitral valve prolapse and severe symptomatic primary mitral regurgitation.  He was originally seen in consultation on July 04, 2017.  At that time we made arrangements for the patient undergo left and right heart catheterization, and we had tentatively plan to proceed with surgery tomorrow.  However, the patient did not show up for his routine preoperative blood work and pulmonary function testing last week.  We attempted to call him to reschedule his preoperative visit for today, but he did not return phone calls.  The patient finally showed up this afternoon with the encouragement of his wife and mother.  He did not have a clear explanation for why he missed his appointment last week or did not return his phone calls.  He states that he went to the hospital Friday afternoon, although there was no record of this.  He continues to smoke cigarettes.  He reports no new problems or complaints.   Current Outpatient Medications  Medication Sig Dispense Refill  . acetaminophen (TYLENOL) 500 MG tablet Take 1,000 mg by mouth every 6 (six) hours as needed for mild pain.    . methocarbamol (ROBAXIN-750) 750 MG tablet Take 1 tablet (750 mg total) by mouth 4 (four) times daily. (Patient not taking: Reported on 08/01/2017) 30 tablet 0  . oxyCODONE-acetaminophen (PERCOCET/ROXICET) 5-325 MG tablet Take 2 tablets by mouth every 4 (four) hours as needed for severe pain. (Patient not taking: Reported on 08/01/2017) 15 tablet 0   No current facility-administered medications for this visit.       Physical Exam:   BP 111/77   Pulse 72   Ht 6' (1.829 m)   Wt 146 lb (66.2 kg)   SpO2 98%   BMI 19.80 kg/m    General:  Well-appearing  Chest:   Clear to auscultation  CV:   Regular rate and rhythm with prominent holosystolic murmur  Incisions:  n/a  Abdomen:  Soft nontender  Extremities:  Warm and well perfused  Diagnostic Tests:  RIGHT/LEFT HEART CATH AND CORONARY ANGIOGRAPHY  Conclusion   1.  Angiographically normal coronary arteries 2.  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 Normal right heart/PCWP pressures  Indications   Non-rheumatic mitral regurgitation [I34.0 (ICD-10-CM)]  Procedural Details/Technique   Technical Details INDICATION: Severe mitral regurgitation, pre-op study  PROCEDURAL DETAILS: There was an indwelling IV in a right antecubital vein. Using normal sterile technique, the IV was changed out for a 5 Fr brachial sheath over a 0.018 inch wire. The right wrist was then prepped, draped, and anesthetized with 1% lidocaine. Using the modified Seldinger technique a 5/6 French Slender sheath was placed in the right radial artery. Initially I was unable to pass a wire into the artery. Ultrasound guidance was then used with success. Intra-arterial verapamil was administered through the radial artery sheath. IV heparin was administered after a JR4 catheter was advanced into the central aorta. A Swan-Ganz catheter was used for the right heart catheterization. Standard protocol was followed for recording of right heart pressures  and sampling of oxygen saturations. Fick cardiac output was calculated. Standard Judkins catheters were used for selective coronary angiography. There were no immediate procedural complications. The patient was transferred to the post catheterization recovery area for further monitoring.     Estimated blood loss <50 mL.  During this procedure the patient was administered the following to achieve and maintain moderate conscious sedation: Versed 6 mg, Fentanyl 75 mcg, while the  patient's heart rate, blood pressure, and oxygen saturation were continuously monitored. The period of conscious sedation was 37 minutes, of which I was present face-to-face 100% of this time.  Coronary Findings   Diagnostic  Dominance: Right  Left Anterior Descending  Vessel is angiographically normal. There is a fistula from the proximal LAD that appears to be connected to the pulmonary artery with flow from the LAD into the PA  Left Circumflex  Vessel is angiographically normal.  Right Coronary Artery  Vessel is angiographically normal.  Intervention   No interventions have been documented.  Right Heart   Right Heart Pressures LV EDP is normal.  Coronary Diagrams   Diagnostic Diagram       Implants     No implant documentation for this case.  MERGE Images   Show images for CARDIAC CATHETERIZATION   Link to Procedure Log   Procedure Log    Hemo Data    Most Recent Value  Fick Cardiac Output 5.56 L/min  Fick Cardiac Output Index 2.94 (L/min)/BSA  RA A Wave 5 mmHg  RA V Wave 4 mmHg  RA Mean 2 mmHg  RV Systolic Pressure 21 mmHg  RV Diastolic Pressure -4 mmHg  RV EDP 2 mmHg  PA Systolic Pressure 21 mmHg  PA Diastolic Pressure 6 mmHg  PA Mean 13 mmHg  PW A Wave 6 mmHg  PW V Wave 7 mmHg  PW Mean 5 mmHg  AO Systolic Pressure 110 mmHg  AO Diastolic Pressure 69 mmHg  AO Mean 86 mmHg  LV Systolic Pressure 108 mmHg  LV Diastolic Pressure -8 mmHg  LV EDP 10 mmHg  Arterial Occlusion Pressure Extended Systolic Pressure 111 mmHg  Arterial Occlusion Pressure Extended Diastolic Pressure 69 mmHg  Arterial Occlusion Pressure Extended Mean Pressure 87 mmHg  Left Ventricular Apex Extended Systolic Pressure 107 mmHg  Left Ventricular Apex Extended Diastolic Pressure 0 mmHg  Left Ventricular Apex Extended EDP Pressure 5 mmHg  QP/QS 0.88  TPVR Index 5.01 HRUI  TSVR Index 29.21 HRUI  PVR SVR Ratio 0.11  TPVR/TSVR Ratio 0.17     CT ANGIOGRAPHY CHEST, ABDOMEN AND  PELVIS  TECHNIQUE: Multidetector CT imaging through the chest, abdomen and pelvis was performed using the standard protocol during bolus administration of intravenous contrast. Multiplanar reconstructed images and MIPs were obtained and reviewed to evaluate the vascular anatomy.  CONTRAST:  75mL ISOVUE-370 IOPAMIDOL (ISOVUE-370) INJECTION 76%  COMPARISON:  Chest radiographs 06/10/2017. CT abdomen and pelvis 07/25/2013.  FINDINGS: CTA CHEST FINDINGS  Cardiovascular: There is no evidence of thoracic aortic dissection or aneurysm. A normal variant aortic arch branching pattern is noted with common origin of the brachiocephalic and left common carotid arteries. The heart is enlarged. There is no pericardial effusion.  Mediastinum/Nodes: No enlarged axillary, mediastinal, or hilar lymph nodes. Grossly unremarkable esophagus and thyroid.  Lungs/Pleura: No pleural effusion or pneumothorax. Likely small volume mucous in the distal trachea. Mild paraseptal and centrilobular emphysema. No mass or suspicious nodules. Minimal dependent atelectasis in the lower lobes.  Musculoskeletal: No acute osseous abnormality or suspicious osseous lesion.  Review of the MIP images confirms the above findings.  CTA ABDOMEN AND PELVIS FINDINGS  VASCULAR  Aorta: Patent with mild calcified and soft plaque. No stenosis or dissection.  Celiac: Patent without evidence of aneurysm, dissection, vasculitis or significant stenosis. The right hepatic artery arises from the celiac artery.  SMA: Patent without evidence of aneurysm, dissection, vasculitis or significant stenosis.  Renals: Single patent renal arteries bilaterally without evidence of aneurysm, dissection, vasculitis, fibromuscular dysplasia or significant stenosis.  IMA: Patent without evidence of aneurysm, dissection, vasculitis or significant stenosis.  Inflow: Patent without evidence of aneurysm, dissection,  vasculitis or significant stenosis.  Veins: Limited assessment due to contrast timing.  Review of the MIP images confirms the above findings.  NON-VASCULAR  Hepatobiliary: No focal liver abnormality is seen. No gallstones, gallbladder wall thickening, or biliary dilatation.  Pancreas: Unremarkable.  Spleen: Unremarkable.  Adrenals/Urinary Tract: Unremarkable adrenal glands. Unchanged 2 mm nonobstructing left lower pole renal calculus. No evidence of renal mass or hydronephrosis. Unremarkable bladder.  Stomach/Bowel: The stomach is within normal limits. No bowel dilatation or gross wall thickening. Unremarkable appendix.  Lymphatic: No enlarged or suspicious lymph nodes.  Reproductive: Unremarkable prostate.  Other: No intraperitoneal free fluid. No abdominal wall mass or hernia.  Musculoskeletal: No acute osseous abnormality or suspicious osseous lesion.  Review of the MIP images confirms the above findings.  IMPRESSION: 1. No evidence of aortic dissection, aneurysm, or aortoiliac occlusive disease. 2. Aortic Atherosclerosis (ICD10-I70.0) and Emphysema (ICD10-J43.9). 3. Nonobstructing left nephrolithiasis.   Electronically Signed   By: Sebastian AcheAllen  Grady M.D.   On: 08/01/2017 17:12    Impression:  Patient has mitral valve prolapse with stage D severe symptomatic primary mitral regurgitation.  He describes stable symptoms of exertional shortness of breath and chest tightness consistent with chronic diastolic congestive heart failure, New York Heart Association functional class I-II.  The patient had a syncopal episode recently of unclear etiology.  I have personally reviewed the patient's transthoracic and transesophageal echocardiograms, diagnostic cardiac catheterization, and CT angiogram.  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  There is severe mitral regurgitation.  Left ventricular function remains normal.  Diagnostic cardiac catheterization is notable for the absence of significant coronary artery disease.  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   CT angiography reveals no contraindication to minimally invasive approach for surgery.    Plan:  I have again reviewed the indications, risks, and potential benefits of mitral valve repair with the patient, his mother, and his wife in the office this afternoon.  The natural history of mitral regurgitation was discussed as well as Trosper-term prognosis with continued medical therapy.    The likelihood of successful and durable valve repair has been discussed with particular reference to the findings of their recent echocardiogram.  Based upon these findings and previous experience, I have quoted them a greater than 95 percent likelihood of successful valve repair.  The patient understands and accepts all potential risks of surgery including but not limited to risk of death, stroke or other neurologic complication, myocardial infarction, congestive heart failure, respiratory failure, renal failure, bleeding requiring transfusion and/or reexploration, arrhythmia, infection or other wound complications, pneumonia, pleural and/or pericardial effusion, pulmonary embolus, aortic dissection or other major vascular complication, or delayed complications  related to valve repair or replacement including but not limited to structural valve deterioration and failure, thrombosis, embolization, endocarditis, or paravalvular leak.  Alternative surgical approaches have been discussed including a comparison between conventional sternotomy and minimally-invasive techniques.  The relative risks and benefits of each have  been reviewed as they pertain to the patient's specific circumstances, and all of their questions have been addressed.  Specific risks potentially related to the minimally-invasive approach were discussed at length, including but not limited to risk of conversion to full or partial sternotomy, aortic dissection or other major vascular complication, unilateral acute lung injury or pulmonary edema, phrenic nerve dysfunction or paralysis, rib fracture, chronic pain, lung hernia, or lymphocele.  All of their questions have been answered.  The patient desires to reschedule surgery as soon as practical.  It may or may not be possible to proceed before the first of the year, but we tentatively plan to proceed with surgery on August 18, 2017.  The patient will return for routine preoperative blood work, chest x-ray, and pulmonary function testing on Monday, August 15, 2017.  He will also be seen here in our office at that time.  He has been instructed to stop smoking.   I spent in excess of 15 minutes during the conduct of this office consultation and >50% of this time involved direct face-to-face encounter with the patient for counseling and/or coordination of their care.    Salvatore Decentlarence H. Cornelius Moraswen, MD 08/01/2017 4:59 PM

## 2017-08-02 ENCOUNTER — Other Ambulatory Visit: Payer: Self-pay

## 2017-08-02 ENCOUNTER — Encounter (HOSPITAL_COMMUNITY): Admission: RE | Payer: Self-pay | Source: Ambulatory Visit

## 2017-08-02 ENCOUNTER — Inpatient Hospital Stay (HOSPITAL_COMMUNITY)
Admission: RE | Admit: 2017-08-02 | Payer: Self-pay | Source: Ambulatory Visit | Admitting: Thoracic Surgery (Cardiothoracic Vascular Surgery)

## 2017-08-02 DIAGNOSIS — I340 Nonrheumatic mitral (valve) insufficiency: Secondary | ICD-10-CM

## 2017-08-02 SURGERY — REPAIR, MITRAL VALVE, MINIMALLY INVASIVE
Anesthesia: General | Site: Chest | Laterality: Right

## 2017-08-12 ENCOUNTER — Telehealth: Payer: Self-pay

## 2017-08-12 NOTE — Progress Notes (Signed)
.  tc

## 2017-08-12 NOTE — Telephone Encounter (Signed)
LMOM for Mr Virginia to call our office to discuss and confirm surgery date and pre op appointment changes. A new letter was mailed out with the date and time changes.

## 2017-08-12 NOTE — Pre-Procedure Instructions (Signed)
Steven Boreronnie R Raver Jr.  08/12/2017      [id number] [address]- [place], [place] - [phone] [facility] [address] [place] [id number] Phone: [phone] Fax: [phone]    Your procedure is scheduled on Thursday December 20.  [facility].M.  Call this number if you have problems the morning of surgery:  [phone]   Remember:  Do not eat food or drink liquids after midnight.  Take these medicines the morning of surgery with A SIP OF WATER:  Tylenol if needed  7 days prior to surgery STOP taking any Aspirin(unless otherwise instructed by your surgeon), Aleve, Naproxen, Ibuprofen, Motrin, Advil, Goody's, BC's, all herbal medications, fish oil, and all vitamins    Do not wear jewelry  Do not wear lotions, powders, or colognes, or deodorant.  Do not shave 48 hours prior to surgery.  Men may shave face and neck.  Do not bring valuables to the hospital.  [facility] is not responsible for any belongings or valuables.  Contacts, dentures or bridgework may not be worn into surgery.  Leave your suitcase in the car.  After surgery it may be brought to your room.  For patients admitted to the hospital, discharge time will be determined by your treatment team.  Patients discharged the day of surgery will not be allowed to drive home.  Special instructions:    Why- Preparing For Surgery  Before surgery, you can play an important role. Because skin is not sterile, your skin needs to be as free of germs as possible. You can reduce the number of germs on your skin by washing with CHG (chlorahexidine gluconate) Soap before surgery.  CHG is an antiseptic cleaner which kills germs and bonds with the skin to continue killing germs even after washing.  Please do not use if you have an allergy to CHG or antibacterial soaps. If your skin becomes reddened/irritated stop using the CHG.  Do not shave  (including legs and underarms) for at least 48 hours prior to first CHG shower. It is OK to shave your face.  Please follow these instructions carefully.   1. Shower the NIGHT BEFORE SURGERY and the MORNING OF SURGERY with CHG.   2. If you chose to wash your hair, wash your hair first as usual with your normal shampoo.  3. After you shampoo, rinse your hair and body thoroughly to remove the shampoo.  4. Use CHG as you would any other liquid soap. You can apply CHG directly to the skin and wash gently with a scrungie or a clean washcloth.   5. Apply the CHG Soap to your body ONLY FROM THE NECK DOWN.  Do not use on open wounds or open sores. Avoid contact with your eyes, ears, mouth and genitals (private parts). Wash Face and genitals (private parts)  with your normal soap.  6. Wash thoroughly, paying special attention to the area where your surgery will be performed.  7. Thoroughly rinse your body with warm water from the neck down.  8. DO NOT shower/wash with your normal soap after using and rinsing off the CHG Soap.  9. Pat yourself dry with a CLEAN TOWEL.  10. Wear CLEAN PAJAMAS to bed the night before surgery, wear comfortable clothes the morning of surgery  11. Place CLEAN SHEETS on your bed the night of your first shower and DO NOT SLEEP WITH PETS.    Day  of Surgery: Do not apply any deodorants/lotions. Please wear clean clothes to the hospital/surgery center.      Please read over the following fact sheets that you were given. Coughing and Deep Breathing, MRSA Information and Surgical Site Infection Prevention

## 2017-08-15 ENCOUNTER — Inpatient Hospital Stay (HOSPITAL_COMMUNITY)
Admission: RE | Admit: 2017-08-15 | Discharge: 2017-08-15 | Disposition: A | Discharge status: Home / Self Care | Payer: Self-pay | Source: Ambulatory Visit

## 2017-08-15 ENCOUNTER — Encounter: Payer: Self-pay | Admitting: Thoracic Surgery (Cardiothoracic Vascular Surgery)

## 2017-08-15 ENCOUNTER — Encounter (HOSPITAL_COMMUNITY): Payer: Self-pay

## 2017-08-15 ENCOUNTER — Ambulatory Visit (HOSPITAL_COMMUNITY): Payer: Self-pay

## 2017-09-02 NOTE — Pre-Procedure Instructions (Signed)
Steven Montoya.  09/02/2017      [id number] [address]- [place], [place] - [phone] [facility] [address] [place] [address] Phone: [phone] Fax: [phone]    Your procedure is scheduled on Wednesday, September 07, 2017  [facility] "A" at 6:30AM  Call this number if you have problems the morning of surgery:  [phone]   Remember:  Do not eat food or drink liquids after midnight.  Take these medicines the morning of surgery with A SIP OF WATER: If needed Acetaminophen (TYLENOL) for pain.  As of today, stop taking all Aspirins, Vitamins, Fish oils, and Herbal medications. Also stop all NSAIDS i.e. Advil, Ibuprofen, Motrin, Aleve, Anaprox, Naproxen, BC and Goody Powders.   Do not wear jewelry.  Do not wear lotions, powders, colognes, or deodorant.  Do not shave 48 hours prior to surgery.  Men may shave face and neck.  Do not bring valuables to the hospital.  [facility] is not responsible for any belongings or valuables.  Contacts, dentures or bridgework may not be worn into surgery.  Leave your suitcase in the car.  After surgery it may be brought to your room.  For patients admitted to the hospital, discharge time will be determined by your treatment team.  Patients discharged the day of surgery will not be allowed to drive home.   Special instructions:  [place]- Preparing For Surgery  Before surgery, you can play an important role. Because skin is not sterile, your skin needs to be as free of germs as possible. You can reduce the number of germs on your skin by washing with CHG (chlorahexidine gluconate) Soap before surgery.  CHG is an antiseptic cleaner which kills germs and bonds with the skin to continue killing germs even after washing.  Please do not use if you have an allergy to CHG or antibacterial soaps. If your skin becomes reddened/irritated stop using the CHG.  Do  not shave (including legs and underarms) for at least 48 hours prior to first CHG shower. It is OK to shave your face.  Please follow these instructions carefully.   1. Shower the NIGHT BEFORE SURGERY and the MORNING OF SURGERY with CHG.   2. If you chose to wash your hair, wash your hair first as usual with your normal shampoo.  3. After you shampoo, rinse your hair and body thoroughly to remove the shampoo.  4. Use CHG as you would any other liquid soap. You can apply CHG directly to the skin and wash gently with a scrungie or a clean washcloth.   5. Apply the CHG Soap to your body ONLY FROM THE NECK DOWN.  Do not use on open wounds or open sores. Avoid contact with your eyes, ears, mouth and genitals (private parts). Wash Face and genitals (private parts)  with your normal soap.  6. Wash thoroughly, paying special attention to the area where your surgery will be performed.  7. Thoroughly rinse your body with warm water from the neck down.  8. DO NOT shower/wash with your normal soap after using and rinsing off the CHG Soap.  9. Pat yourself dry with a CLEAN TOWEL.  10. Wear CLEAN PAJAMAS to bed the night before surgery, wear comfortable clothes the morning of surgery  11. Place CLEAN SHEETS on your bed the night of your first shower and DO NOT SLEEP WITH PETS.  Day of Surgery:  Do not apply any deodorants/lotions. Please wear clean clothes to the hospital/surgery center.    Please read over the following fact sheets that you were given. Pain Booklet, Coughing and Deep Breathing, Blood Transfusion Information, MRSA Information and Surgical Site Infection Prevention

## 2017-09-05 ENCOUNTER — Ambulatory Visit (HOSPITAL_COMMUNITY)
Admission: RE | Admit: 2017-09-05 | Discharge: 2017-09-05 | Disposition: A | Discharge status: Home / Self Care | Payer: Medicaid Other | Source: Ambulatory Visit | Attending: Thoracic Surgery (Cardiothoracic Vascular Surgery) | Admitting: Thoracic Surgery (Cardiothoracic Vascular Surgery)

## 2017-09-05 ENCOUNTER — Encounter (HOSPITAL_COMMUNITY)
Admission: RE | Admit: 2017-09-05 | Discharge: 2017-09-05 | Disposition: A | Discharge status: Home / Self Care | Payer: Medicaid Other | Source: Ambulatory Visit | Attending: Thoracic Surgery (Cardiothoracic Vascular Surgery) | Admitting: Thoracic Surgery (Cardiothoracic Vascular Surgery)

## 2017-09-05 ENCOUNTER — Ambulatory Visit (HOSPITAL_BASED_OUTPATIENT_CLINIC_OR_DEPARTMENT_OTHER)
Admission: RE | Admit: 2017-09-05 | Discharge: 2017-09-05 | Disposition: A | Discharge status: Home / Self Care | Payer: Medicaid Other | Source: Ambulatory Visit | Attending: Thoracic Surgery (Cardiothoracic Vascular Surgery) | Admitting: Thoracic Surgery (Cardiothoracic Vascular Surgery)

## 2017-09-05 ENCOUNTER — Other Ambulatory Visit: Payer: Self-pay

## 2017-09-05 ENCOUNTER — Encounter (HOSPITAL_COMMUNITY): Payer: Self-pay

## 2017-09-05 ENCOUNTER — Ambulatory Visit (INDEPENDENT_AMBULATORY_CARE_PROVIDER_SITE_OTHER): Payer: Medicaid Other | Admitting: Thoracic Surgery (Cardiothoracic Vascular Surgery)

## 2017-09-05 ENCOUNTER — Encounter: Payer: Self-pay | Admitting: Thoracic Surgery (Cardiothoracic Vascular Surgery)

## 2017-09-05 VITALS — BP 132/83 | HR 81 | Resp 18 | Ht 72.0 in | Wt 145.4 lb

## 2017-09-05 DIAGNOSIS — I340 Nonrheumatic mitral (valve) insufficiency: Secondary | ICD-10-CM

## 2017-09-05 DIAGNOSIS — I341 Nonrheumatic mitral (valve) prolapse: Secondary | ICD-10-CM

## 2017-09-05 DIAGNOSIS — Z01818 Encounter for other preprocedural examination: Secondary | ICD-10-CM | POA: Insufficient documentation

## 2017-09-05 DIAGNOSIS — R9431 Abnormal electrocardiogram [ECG] [EKG]: Secondary | ICD-10-CM | POA: Insufficient documentation

## 2017-09-05 DIAGNOSIS — Z0183 Encounter for blood typing: Secondary | ICD-10-CM | POA: Diagnosis not present

## 2017-09-05 DIAGNOSIS — Z01812 Encounter for preprocedural laboratory examination: Secondary | ICD-10-CM | POA: Diagnosis not present

## 2017-09-05 DIAGNOSIS — I6523 Occlusion and stenosis of bilateral carotid arteries: Secondary | ICD-10-CM | POA: Diagnosis not present

## 2017-09-05 HISTORY — DX: Anxiety disorder, unspecified: F41.9

## 2017-09-05 HISTORY — DX: Depression, unspecified: F32.A

## 2017-09-05 HISTORY — DX: Major depressive disorder, single episode, unspecified: F32.9

## 2017-09-05 LAB — PULMONARY FUNCTION TEST
DL/VA % pred: 81 %
DL/VA: 3.87 ml/min/mmHg/L
DLCO UNC % PRED: 73 %
DLCO UNC: 25.74 ml/min/mmHg
FEF 25-75 Post: 1.64 L/sec
FEF 25-75 Pre: 2.35 L/sec
FEF2575-%Change-Post: -30 %
FEF2575-%Pred-Post: 39 %
FEF2575-%Pred-Pre: 57 %
FEV1-%Change-Post: -9 %
FEV1-%PRED-POST: 65 %
FEV1-%Pred-Pre: 72 %
FEV1-POST: 2.89 L
FEV1-Pre: 3.2 L
FEV1FVC-%Change-Post: 0 %
FEV1FVC-%Pred-Pre: 82 %
FEV6-%CHANGE-POST: -4 %
FEV6-%PRED-POST: 79 %
FEV6-%PRED-PRE: 82 %
FEV6-POST: 4.32 L
FEV6-PRE: 4.52 L
FEV6FVC-%CHANGE-POST: -1 %
FEV6FVC-%PRED-POST: 99 %
FEV6FVC-%PRED-PRE: 101 %
FVC-%CHANGE-POST: -9 %
FVC-%Pred-Post: 78 %
FVC-%Pred-Pre: 86 %
FVC-Post: 4.41 L
FVC-Pre: 4.87 L
POST FEV6/FVC RATIO: 98 %
PRE FEV1/FVC RATIO: 66 %
Post FEV1/FVC ratio: 66 %
Pre FEV6/FVC Ratio: 99 %
RV % PRED: 219 %
RV: 4.3 L
TLC % PRED: 128 %
TLC: 9.45 L

## 2017-09-05 LAB — URINALYSIS, ROUTINE W REFLEX MICROSCOPIC
BACTERIA UA: NONE SEEN
Bilirubin Urine: NEGATIVE
GLUCOSE, UA: NEGATIVE mg/dL
Hgb urine dipstick: NEGATIVE
KETONES UR: NEGATIVE mg/dL
Leukocytes, UA: NEGATIVE
Nitrite: NEGATIVE
PROTEIN: 100 mg/dL — AB
Specific Gravity, Urine: 1.025 (ref 1.005–1.030)
pH: 5 (ref 5.0–8.0)

## 2017-09-05 LAB — COMPREHENSIVE METABOLIC PANEL
ALT: 9 U/L — ABNORMAL LOW (ref 17–63)
AST: 22 U/L (ref 15–41)
Albumin: 3.8 g/dL (ref 3.5–5.0)
Alkaline Phosphatase: 47 U/L (ref 38–126)
Anion gap: 8 (ref 5–15)
BUN: 13 mg/dL (ref 6–20)
CHLORIDE: 108 mmol/L (ref 101–111)
CO2: 20 mmol/L — AB (ref 22–32)
CREATININE: 0.81 mg/dL (ref 0.61–1.24)
Calcium: 9.4 mg/dL (ref 8.9–10.3)
Glucose, Bld: 103 mg/dL — ABNORMAL HIGH (ref 65–99)
POTASSIUM: 3.7 mmol/L (ref 3.5–5.1)
SODIUM: 136 mmol/L (ref 135–145)
Total Bilirubin: 0.8 mg/dL (ref 0.3–1.2)
Total Protein: 5.9 g/dL — ABNORMAL LOW (ref 6.5–8.1)

## 2017-09-05 LAB — CBC
HCT: 41.2 % (ref 39.0–52.0)
Hemoglobin: 13.8 g/dL (ref 13.0–17.0)
MCH: 30.7 pg (ref 26.0–34.0)
MCHC: 33.5 g/dL (ref 30.0–36.0)
MCV: 91.8 fL (ref 78.0–100.0)
PLATELETS: 242 10*3/uL (ref 150–400)
RBC: 4.49 MIL/uL (ref 4.22–5.81)
RDW: 14 % (ref 11.5–15.5)
WBC: 12.6 10*3/uL — AB (ref 4.0–10.5)

## 2017-09-05 LAB — TYPE AND SCREEN
ABO/RH(D): A POS
ANTIBODY SCREEN: NEGATIVE

## 2017-09-05 LAB — BLOOD GAS, ARTERIAL
ACID-BASE DEFICIT: 0.7 mmol/L (ref 0.0–2.0)
Bicarbonate: 23.7 mmol/L (ref 20.0–28.0)
DRAWN BY: 470591
FIO2: 21
O2 SAT: 88.8 %
PATIENT TEMPERATURE: 98.6
pCO2 arterial: 40.6 mmHg (ref 32.0–48.0)
pH, Arterial: 7.384 (ref 7.350–7.450)
pO2, Arterial: 55.3 mmHg — ABNORMAL LOW (ref 83.0–108.0)

## 2017-09-05 LAB — HEMOGLOBIN A1C
HEMOGLOBIN A1C: 5 % (ref 4.8–5.6)
MEAN PLASMA GLUCOSE: 96.8 mg/dL

## 2017-09-05 LAB — PROTIME-INR
INR: 0.93
PROTHROMBIN TIME: 12.3 s (ref 11.4–15.2)

## 2017-09-05 LAB — SURGICAL PCR SCREEN
MRSA, PCR: NEGATIVE
STAPHYLOCOCCUS AUREUS: NEGATIVE

## 2017-09-05 LAB — APTT: APTT: 25 s (ref 24–36)

## 2017-09-05 LAB — ABO/RH: ABO/RH(D): A POS

## 2017-09-05 MED ORDER — ALBUTEROL SULFATE (2.5 MG/3ML) 0.083% IN NEBU
2.5000 mg | INHALATION_SOLUTION | Freq: Once | RESPIRATORY_TRACT | Status: AC
  Administered 2017-09-05: 2.5 mg via RESPIRATORY_TRACT

## 2017-09-05 NOTE — Patient Instructions (Signed)
Stop smoking  Stop taking naproxen and Robaxin  Continue taking all other medications without change through the day before surgery.  Have nothing to eat or drink after midnight the night before surgery.  On the morning of surgery do not take any medications

## 2017-09-05 NOTE — Progress Notes (Addendum)
Pre-op Cardiac Surgery  Carotid Findings:  Bilateral 40-59% ICA stenosis, antegrade vertebral flow.   Upper Extremity Right Left  Brachial Pressures 122, Tri 123, Tri  Radial Waveforms Tri Tri  Ulnar Waveforms Tri Tri  Palmar Arch (Allen's Test) waveform diminishes greater than 50% with radial compression and reverses with ulnar compression waveform reverses with radial compression and increases with ulnar compression.   Steven Montoya- RDMS, RVT 1:43 PM  09/05/2017

## 2017-09-05 NOTE — Progress Notes (Signed)
[address]       [place] [address]             [phone]     CARDIOTHORACIC SURGERY OFFICE NOTE  Referring Provider is Jake Bathe, MD PCP is Patient, No Pcp Per   HPI:  Patient is a 42 year old male with history of severe symptomatic primary mitral regurgitation caused by mitral valve prolapse who returns to the office today with tentative plans to proceed with elective mitral valve repair on Wednesday, September 07, 2017.  He was originally seen in consultation on July 04, 2017.  He was last seen here in our office on August 01, 2017.  He reports no new problems or complaints over the past month.  He continues to experience exertional shortness of breath with moderate level activity.  He also describes intermittent substernal chest pressure associated with shortness of breath that seems to come and go and is not related to physical activity.  He he continues to smoke cigarettes although he states that he has cut back and is currently only smoking 3 or 4 cigarettes daily.  He denies any productive cough, fevers, hemoptysis, or wheezing.  The remainder of his review of systems is unremarkable.   Current Outpatient Medications  Medication Sig Dispense Refill  . acetaminophen (TYLENOL) 500 MG tablet Take 1,000-1,500 mg by mouth every 6 (six) hours as needed for mild pain or moderate pain (depends on pain if takes 2-3 tablets).     . naproxen sodium (ALEVE) 220 MG tablet Take 440-660 mg by mouth 4 (four) times daily as needed (depends on pain if takes 2-3 tablets).    . methocarbamol (ROBAXIN-750) 750 MG tablet Take 1 tablet (750 mg total) by mouth 4 (four) times daily. (Patient not taking: Reported on 08/01/2017) 30 tablet 0  . oxyCODONE-acetaminophen (PERCOCET/ROXICET) 5-325 MG tablet Take 2 tablets by mouth every 4 (four) hours as needed for severe pain. (Patient not taking: Reported on 08/01/2017) 15 tablet 0   No current facility-administered medications for  this visit.       Physical Exam:   BP 132/83 (BP Location: Right Arm, Patient Position: Sitting, Cuff Size: Normal)   Pulse 81   Resp 18   Ht 6' (1.829 m)   Wt 145 lb 6.4 oz (66 kg)   SpO2 99% Comment: on RA  BMI 19.72 kg/m   General:  Well-appearing  Chest:   Clear to auscultation with symmetrical breath sounds  CV:   Regular rate and rhythm with prominent holosystolic murmur  Incisions:  n/a  Abdomen:  Soft nontender  Extremities:  Warm and well perfused  Diagnostic Tests:  Transthoracic Echocardiography  Patient:    Steven, Montoya MR #:       [id number] Study Date: 06/08/2017 Gender:     M Age:        40 Height:     182.9 cm Weight:     66.7 kg BSA:        1.83 m^2 Pt. Status: Room:   ATTENDING    Armanda Magic, MD  Asencion Partridge, Jennet Maduro  REFERRING    Rosalio Macadamia  SONOGRAPHER  Clearence Ped, RCS  PERFORMING   [facility]  cc:  ------------------------------------------------------------------- LV EF: 60% -   65%  ------------------------------------------------------------------- Indications:      Murmur (R01.1).  ------------------------------------------------------------------- History:   PMH:   Mitral valve prolapse.  Risk factors:  Current tobacco use.  -------------------------------------------------------------------  Study Conclusions  - Left ventricle: The cavity size was normal. Wall thickness was   increased in a pattern of mild LVH. Systolic function was normal.   The estimated ejection fraction was in the range of 60% to 65%.   Wall motion was normal; there were no regional wall motion   abnormalities. Left ventricular diastolic function parameters   were normal. - Mitral valve: Mild posterior leaflet prolapse. Mildly thickened   leaflets . There was moderate eccentric regurgitation. - Left atrium: The atrium was mildly dilated.  Impressions:  - Consider TEE for a more comprehensive assessment of mitral    regurgitant jet.  ------------------------------------------------------------------- Study data:  No prior study was available for comparison.  Study status:  Routine.  Procedure:  The patient reported no pain pre or post test. Transthoracic echocardiography. Image quality was adequate.          Transthoracic echocardiography.  M-mode, complete 2D, spectral Doppler, and color Doppler.  Birthdate: Patient birthdate: September 09, 1975.  Age:  Patient is 42 yr old.  Sex: Gender: male.    BMI: 19.9 kg/m^2.  Blood pressure:     120/80 Patient status:  Outpatient.  Study date:  Study date: 06/08/2017. Study time: 03:09 PM.  Location:   [facility]  -------------------------------------------------------------------  ------------------------------------------------------------------- Left ventricle:  The cavity size was normal. Wall thickness was increased in a pattern of mild LVH. Systolic function was normal. The estimated ejection fraction was in the range of 60% to 65%. Wall motion was normal; there were no regional wall motion abnormalities. The transmitral flow pattern was normal. The deceleration time of the early transmitral flow velocity was normal. The pulmonary vein flow pattern was normal. The tissue Doppler parameters were normal. Left ventricular diastolic function parameters were normal.  ------------------------------------------------------------------- Aortic valve:   Trileaflet.  Doppler:   There was no stenosis. There was no regurgitation.  ------------------------------------------------------------------- Aorta:  Aortic root: The aortic root was normal in size.  ------------------------------------------------------------------- Mitral valve:  Mild posterior leaflet prolapse.  Mildly thickened leaflets .  Doppler:  There was moderate eccentric regurgitation.  Valve area by pressure half-time: 4.4 cm^2. Indexed valve area by pressure half-time: 2.4 cm^2/m^2.     Peak gradient (D): 6 mm Hg.   ------------------------------------------------------------------- Left atrium:  The atrium was mildly dilated.  ------------------------------------------------------------------- Atrial septum:  No defect or patent foramen ovale was identified.   ------------------------------------------------------------------- Right ventricle:  The cavity size was normal. Wall thickness was normal. Systolic function was normal.  ------------------------------------------------------------------- Pulmonic valve:    The valve appears to be grossly normal. Doppler:  There was no significant regurgitation.  ------------------------------------------------------------------- Tricuspid valve:   Structurally normal valve.   Leaflet separation was normal.  Doppler:  Transvalvular velocity was within the normal range. There was trivial regurgitation.  ------------------------------------------------------------------- Right atrium:  The atrium was normal in size.  ------------------------------------------------------------------- Pericardium:  There was no pericardial effusion.  ------------------------------------------------------------------- Systemic veins: Inferior vena cava: The vessel was normal in size. The respirophasic diameter changes were in the normal range (= 50%), consistent with normal central venous pressure. Diameter: 20 mm.  ------------------------------------------------------------------- Measurements   IVC                                      Value          Reference  ID  20    mm       ----------    Left ventricle                           Value          Reference  LV ID, ED, PLAX chordal          (H)     56    mm       43 - 52  LV ID, ES, PLAX chordal                  38    mm       23 - 38  LV fx shortening, PLAX chordal           32    %        >=29  LV PW thickness, ED                       13    mm       ----------  IVS/LV PW ratio, ED                      0.77           <=1.3  Stroke volume, 2D                        76    ml       ----------  Stroke volume/bsa, 2D                    42    ml/m^2   ----------  LV e&', lateral                           16.2  cm/s     ----------  LV E/e&', lateral                         7.41           ----------  LV e&', medial                            9.57  cm/s     ----------  LV E/e&', medial                          12.54          ----------  LV e&', average                           12.89 cm/s     ----------  LV E/e&', average                         9.31           ----------    Ventricular septum                       Value          Reference  IVS thickness, ED  10    mm       ----------    LVOT                                     Value          Reference  LVOT ID, S                               22    mm       ----------  LVOT area                                3.8   cm^2     ----------  LVOT peak velocity, S                    105   cm/s     ----------  LVOT mean velocity, S                    74.1  cm/s     ----------  LVOT VTI, S                              20    cm       ----------    Aorta                                    Value          Reference  Aortic root ID, ED                       36    mm       ----------    Left atrium                              Value          Reference  LA ID, A-P, ES                           41    mm       ----------  LA ID/bsa, A-P                   (H)     2.24  cm/m^2   <=2.2  LA volume, S                             74.3  ml       ----------  LA volume/bsa, S                         40.6  ml/m^2   ----------  LA volume, ES, 1-p A4C                   60.1  ml       ----------  LA volume/bsa, ES, 1-p A4C  32.8  ml/m^2   ----------  LA volume, ES, 1-p A2C                   92.5  ml       ----------  LA volume/bsa, ES, 1-p A2C               50.5  ml/m^2    ----------    Mitral valve                             Value          Reference  Mitral E-wave peak velocity              120   cm/s     ----------  Mitral A-wave peak velocity              59.7  cm/s     ----------  Mitral deceleration time                 169   ms       150 - 230  Mitral pressure half-time                50    ms       ----------  Mitral peak gradient, D                  6     mm Hg    ----------  Mitral E/A ratio, peak                   2              ----------  Mitral valve area, PHT, DP               4.4   cm^2     ----------  Mitral valve area/bsa, PHT, DP           2.4   cm^2/m^2 ----------  Mitral regurg VTI, PISA                  125   cm       ----------    Pulmonary arteries                       Value          Reference  PA pressure, S, DP                       23    mm Hg    <=30    Tricuspid valve                          Value          Reference  Tricuspid regurg peak velocity           225   cm/s     ----------  Tricuspid peak RV-RA gradient            20    mm Hg    ----------  Tricuspid maximal regurg                 225   cm/s     ----------  velocity, PISA    Right atrium  Value          Reference  RA ID, S-I, ES, A4C              (H)     49.1  mm       34 - 49  RA area, ES, A4C                         15.1  cm^2     8.3 - 19.5  RA volume, ES, A/L                       38.9  ml       ----------  RA volume/bsa, ES, A/L                   21.2  ml/m^2   ----------    Systemic veins                           Value          Reference  Estimated CVP                            3     mm Hg    ----------    Right ventricle                          Value          Reference  TAPSE                                    23    mm       ----------  RV pressure, S, DP                       23    mm Hg    <=30  RV s&', lateral, S                        11.7  cm/s     ----------  Legend: (L)  and  (H)  mark values outside specified  reference range.  ------------------------------------------------------------------- Prepared and Electronically Authenticated by  Prentice Docker, MD 2018-10-10T16:41:08    Transesophageal Echocardiography  Patient:    Steven, Montoya MR #:       [id number] Study Date: 06/28/2017 Gender:     M Age:        40 Height:     182.9 cm Weight:     67.7 kg BSA:        1.85 m^2 Pt. Status: Room:   ADMITTING    Donato Schultz, M.D.  ATTENDING    Donato Schultz, M.D.  PERFORMING   Donato Schultz, M.D.  Janalee Dane  REFERRING    Norma Fredrickson C  SONOGRAPHER  Thurman Coyer  cc:  ------------------------------------------------------------------- LV EF: 60% -   65%  ------------------------------------------------------------------- Indications:      Mitral valve prolapse 424.0.  Mitral regurgitation 424.0.  ------------------------------------------------------------------- History:   Risk factors:  Current tobacco use.  ------------------------------------------------------------------- Study Conclusions  - Left ventricle: The cavity size was normal. Wall thickness was   normal. Systolic function was normal. The  estimated ejection   fraction was in the range of 60% to 65%. - Aortic valve: No evidence of vegetation. - Mitral valve: Severe prolapse, involving the posterior leaflet.   No evidence of vegetation. There was severe regurgitation   directed eccentrically wrapping around left atrium. - Left atrium: The atrium was mildly dilated. No evidence of   thrombus in the appendage. - Atrial septum: No defect or patent foramen ovale was identified.  Recommendations:  Will refer to [facility] for mitral valve repair evaluation.  ------------------------------------------------------------------- Study data:   Study status:  Routine.  Consent:  The risks, benefits, and alternatives to the procedure were explained to the patient and informed consent  was obtained.  Procedure:  The patient reported no pain pre or post test. Initial setup. The patient was brought to the laboratory. Surface ECG leads were monitored. Sedation. Conscious sedation was administered by anesthesiology staff. Transesophageal echocardiography. Topical anesthesia was obtained using viscous lidocaine. A transesophageal probe was inserted by the attending cardiologist. Image quality was adequate.  Study completion:  The patient tolerated the procedure well. There were no complications.          Diagnostic transesophageal echocardiography.  2D and color Doppler.  Birthdate:  Patient birthdate: 1976-01-16.  Age:  Patient is 42 yr old.  Sex:  Gender: male.    BMI: 20.3 kg/m^2.  Blood pressure:     139/102  Patient status:  Inpatient.  Study date:  Study date: 06/28/2017. Study time: 08:21 AM.  Location:  Endoscopy.  -------------------------------------------------------------------  ------------------------------------------------------------------- Left ventricle:  The cavity size was normal. Wall thickness was normal. Systolic function was normal. The estimated ejection fraction was in the range of 60% to 65%.  ------------------------------------------------------------------- Aortic valve:   Structurally normal valve.   Cusp separation was normal.  No evidence of vegetation.  Doppler:  There was no regurgitation.  ------------------------------------------------------------------- Aorta:  The aorta was normal, not dilated, and non-diseased.  ------------------------------------------------------------------- Mitral valve:  Leaflet separation was normal.  Severe prolapse, involving the posterior leaflet.  No evidence of vegetation. Doppler:  There was severe regurgitation directed eccentrically wrapping around left atrium.  ------------------------------------------------------------------- Left atrium:  The atrium was mildly dilated.  No evidence  of thrombus in the appendage. The appendage was of normal size. Emptying velocity was normal.  ------------------------------------------------------------------- Atrial septum:  No defect or patent foramen ovale was identified. Echo contrast study showed no right-to-left atrial level shunt, following an increase in RA pressure induced by provocative maneuvers.  ------------------------------------------------------------------- Right ventricle:  The cavity size was normal. Wall thickness was normal. Systolic function was normal.  ------------------------------------------------------------------- Pulmonic valve:    Structurally normal valve.   Cusp separation was normal.  No evidence of vegetation.  ------------------------------------------------------------------- Tricuspid valve:   Structurally normal valve.   Leaflet separation was normal.  No evidence of vegetation.  Doppler:  There was no regurgitation.  ------------------------------------------------------------------- Pulmonary artery:   The main pulmonary artery was normal-sized.  ------------------------------------------------------------------- Right atrium:  The atrium was normal in size.  No evidence of thrombus in the atrial cavity or appendage.  ------------------------------------------------------------------- Pericardium:  The pericardium was normal in appearance. There was no pericardial effusion.  ------------------------------------------------------------------- Systemic veins: Superior vena cava: The study excluded a thrombus.  ------------------------------------------------------------------- Post procedure conclusions Ascending Aorta:  - The aorta was normal, not dilated, and non-diseased.  ------------------------------------------------------------------- Prepared and Electronically Authenticated by  Donato SchultzMark Skains, M.D. 2018-10-30T09:24:29    RIGHT/LEFT HEART CATH AND  CORONARY ANGIOGRAPHY  Conclusion   1. Angiographically normal coronary arteries 2.  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 Normal right heart/PCWP pressures  Indications   Non-rheumatic mitral regurgitation [I34.0 (ICD-10-CM)]  Procedural Details/Technique   Technical Details INDICATION: Severe mitral regurgitation, pre-op study  PROCEDURAL DETAILS: There was an indwelling IV in a right antecubital vein. Using normal sterile technique, the IV was changed out for a 5 Fr brachial sheath over a 0.018 inch wire. The right wrist was then prepped, draped, and anesthetized with 1% lidocaine. Using the modified Seldinger technique a 5/6 French Slender sheath was placed in the right radial artery. Initially I was unable to pass a wire into the artery. Ultrasound guidance was then used with success. Intra-arterial verapamil was administered through the radial artery sheath. IV heparin was administered after a JR4 catheter was advanced into the central aorta. A Swan-Ganz catheter was used for the right heart catheterization. Standard protocol was followed for recording of right heart pressures and sampling of oxygen saturations. Fick cardiac output was calculated. Standard Judkins catheters were used for selective coronary angiography. There were no immediate procedural complications. The patient was transferred to the post catheterization recovery area for further monitoring.     Estimated blood loss <50 mL.  During this procedure the patient was administered the following to achieve and maintain moderate conscious sedation: Versed 6 mg, Fentanyl 75 mcg, while the patient's heart rate, blood pressure, and oxygen saturation were continuously monitored. The period of conscious sedation was 37 minutes, of which I was present face-to-face 100% of this time.  Coronary Findings   Diagnostic  Dominance: Right  Left Anterior  Descending  Vessel is angiographically normal. There is a fistula from the proximal LAD that appears to be connected to the pulmonary artery with flow from the LAD into the PA  Left Circumflex  Vessel is angiographically normal.  Right Coronary Artery  Vessel is angiographically normal.  Intervention   No interventions have been documented.  Right Heart   Right Heart Pressures LV EDP is normal.  Coronary Diagrams   Diagnostic Diagram       Implants        No implant documentation for this case.  MERGE Images   Show images for CARDIAC CATHETERIZATION   Link to Procedure Log   Procedure Log    Hemo Data    Most Recent Value  Fick Cardiac Output 5.56 L/min  Fick Cardiac Output Index 2.94 (L/min)/BSA  RA A Wave 5 mmHg  RA V Wave 4 mmHg  RA Mean 2 mmHg  RV Systolic Pressure 21 mmHg  RV Diastolic Pressure -4 mmHg  RV EDP 2 mmHg  PA Systolic Pressure 21 mmHg  PA Diastolic Pressure 6 mmHg  PA Mean 13 mmHg  PW A Wave 6 mmHg  PW V Wave 7 mmHg  PW Mean 5 mmHg  AO Systolic Pressure 110 mmHg  AO Diastolic Pressure 69 mmHg  AO Mean 86 mmHg  LV Systolic Pressure 108 mmHg  LV Diastolic Pressure -8 mmHg  LV EDP 10 mmHg  Arterial Occlusion Pressure Extended Systolic Pressure 111 mmHg  Arterial Occlusion Pressure Extended Diastolic Pressure 69 mmHg  Arterial Occlusion Pressure Extended Mean Pressure 87 mmHg  Left Ventricular Apex Extended Systolic Pressure 107 mmHg  Left Ventricular Apex Extended Diastolic Pressure 0 mmHg  Left Ventricular Apex Extended EDP Pressure 5 mmHg  QP/QS 0.88  TPVR Index 5.01 HRUI  TSVR Index 29.21 HRUI  PVR SVR Ratio 0.11  TPVR/TSVR Ratio 0.17     CT ANGIOGRAPHY CHEST, ABDOMEN AND PELVIS  TECHNIQUE: Multidetector CT imaging through the chest, abdomen and pelvis was performed using the standard protocol during bolus administration of intravenous contrast. Multiplanar reconstructed images and MIPs were obtained and reviewed  to evaluate the vascular anatomy.  CONTRAST: 75mL ISOVUE-370 IOPAMIDOL (ISOVUE-370) INJECTION 76%  COMPARISON: Chest radiographs 06/10/2017. CT abdomen and pelvis 07/25/2013.  FINDINGS: CTA CHEST FINDINGS  Cardiovascular: There is no evidence of thoracic aortic dissection or aneurysm. A normal variant aortic arch branching pattern is noted with common origin of the brachiocephalic and left common carotid arteries. The heart is enlarged. There is no pericardial effusion.  Mediastinum/Nodes: No enlarged axillary, mediastinal, or hilar lymph nodes. Grossly unremarkable esophagus and thyroid.  Lungs/Pleura: No pleural effusion or pneumothorax. Likely small volume mucous in the distal trachea. Mild paraseptal and centrilobular emphysema. No mass or suspicious nodules. Minimal dependent atelectasis in the lower lobes.  Musculoskeletal: No acute osseous abnormality or suspicious osseous lesion.  Review of the MIP images confirms the above findings.  CTA ABDOMEN AND PELVIS FINDINGS  VASCULAR  Aorta: Patent with mild calcified and soft plaque. No stenosis or dissection.  Celiac: Patent without evidence of aneurysm, dissection, vasculitis or significant stenosis. The right hepatic artery arises from the celiac artery.  SMA: Patent without evidence of aneurysm, dissection, vasculitis or significant stenosis.  Renals: Single patent renal arteries bilaterally without evidence of aneurysm, dissection, vasculitis, fibromuscular dysplasia or significant stenosis.  IMA: Patent without evidence of aneurysm, dissection, vasculitis or significant stenosis.  Inflow: Patent without evidence of aneurysm, dissection, vasculitis or significant stenosis.  Veins: Limited assessment due to contrast timing.  Review of the MIP images confirms the above findings.  NON-VASCULAR  Hepatobiliary: No focal liver abnormality is seen. No gallstones, gallbladder wall  thickening, or biliary dilatation.  Pancreas: Unremarkable.  Spleen: Unremarkable.  Adrenals/Urinary Tract: Unremarkable adrenal glands. Unchanged 2 mm nonobstructing left lower pole renal calculus. No evidence of renal mass or hydronephrosis. Unremarkable bladder.  Stomach/Bowel: The stomach is within normal limits. No bowel dilatation or gross wall thickening. Unremarkable appendix.  Lymphatic: No enlarged or suspicious lymph nodes.  Reproductive: Unremarkable prostate.  Other: No intraperitoneal free fluid. No abdominal wall mass or hernia.  Musculoskeletal: No acute osseous abnormality or suspicious osseous lesion.  Review of the MIP images confirms the above findings.  IMPRESSION: 1. No evidence of aortic dissection, aneurysm, or aortoiliac occlusive disease. 2. Aortic Atherosclerosis (ICD10-I70.0) and Emphysema (ICD10-J43.9). 3. Nonobstructing left nephrolithiasis.   Electronically Signed By: Sebastian Ache M.D. On: 08/01/2017 17:12    Impression:  Patient has mitral valve prolapse with stage D severe symptomatic primary mitral regurgitation.  He describes stable symptoms of exertional shortness of breath and chest tightness consistent with chronic diastolic congestive heart failure, [employer] [place] [employer] Association functional class I-II.  The patient had a syncopal episode recently of unclear etiology.  I have personally reviewed the patient's transthoracic and transesophageal echocardiograms, diagnostic cardiac catheterization, and CT angiogram.  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  There is severe mitral regurgitation.  Left ventricular function remains normal.  Diagnostic cardiac catheterization is notable for the absence of significant coronary artery disease.  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  CT angiography reveals no contraindication to minimally invasive approach for surgery.    Plan:  I have again reviewed the indications, risks, and potential benefits of mitral valve repair with the patient, his mother, and his wife in the office this afternoon.  The natural history of mitral regurgitation was discussed as well as [place]-term prognosis with continued medical therapy.    The likelihood of successful and durable valve repair has been discussed with particular reference to the findings of their recent echocardiogram.  Based upon these findings and previous experience, I have quoted them a greater than 95 percent likelihood of successful valve repair.  The patient understands and accepts all potential risks of surgery including but not limited to risk of death, stroke or other neurologic complication, myocardial infarction, congestive heart failure, respiratory failure, renal failure, bleeding requiring transfusion and/or reexploration, arrhythmia, infection or other wound complications, pneumonia, pleural and/or pericardial effusion, pulmonary embolus, aortic dissection or other major vascular complication, or delayed complications related to valve repair or replacement including but not limited to structural valve deterioration and failure, thrombosis, embolization, endocarditis, or paravalvular leak.  Alternative surgical approaches have been discussed including a comparison between conventional sternotomy and minimally-invasive techniques.  The relative risks and benefits of each have been reviewed as they pertain to the patient's specific circumstances, and all of their questions have been addressed.  Specific risks potentially related to the minimally-invasive approach were discussed at length, including but not limited to risk of  conversion to full or partial sternotomy, aortic dissection or other major vascular complication, unilateral acute lung injury or pulmonary edema, phrenic nerve dysfunction or paralysis, rib fracture, chronic pain, lung hernia, or lymphocele.  All of their questions have been answered.    We plan to proceed with surgery on Wednesday, September 07, 2017.   I spent in excess of 15 minutes during the conduct of this office consultation and >50% of this time involved direct face-to-face encounter with the patient for counseling and/or coordination of their care.   Salvatore Decent. Cornelius Moras, MD 09/05/2017 9:37 AM

## 2017-09-05 NOTE — H&P (View-Only) (Signed)
[address]       [place] [address]             [phone]     CARDIOTHORACIC SURGERY OFFICE NOTE  Referring Provider is Jake Bathe, MD PCP is Patient, No Pcp Per   HPI:  Patient is a 42 year old male with history of severe symptomatic primary mitral regurgitation caused by mitral valve prolapse who returns to the office today with tentative plans to proceed with elective mitral valve repair on Wednesday, September 07, 2017.  He was originally seen in consultation on July 04, 2017.  He was last seen here in our office on August 01, 2017.  He reports no new problems or complaints over the past month.  He continues to experience exertional shortness of breath with moderate level activity.  He also describes intermittent substernal chest pressure associated with shortness of breath that seems to come and go and is not related to physical activity.  He he continues to smoke cigarettes although he states that he has cut back and is currently only smoking 3 or 4 cigarettes daily.  He denies any productive cough, fevers, hemoptysis, or wheezing.  The remainder of his review of systems is unremarkable.   Current Outpatient Medications  Medication Sig Dispense Refill  . acetaminophen (TYLENOL) 500 MG tablet Take 1,000-1,500 mg by mouth every 6 (six) hours as needed for mild pain or moderate pain (depends on pain if takes 2-3 tablets).     . naproxen sodium (ALEVE) 220 MG tablet Take 440-660 mg by mouth 4 (four) times daily as needed (depends on pain if takes 2-3 tablets).    . methocarbamol (ROBAXIN-750) 750 MG tablet Take 1 tablet (750 mg total) by mouth 4 (four) times daily. (Patient not taking: Reported on 08/01/2017) 30 tablet 0  . oxyCODONE-acetaminophen (PERCOCET/ROXICET) 5-325 MG tablet Take 2 tablets by mouth every 4 (four) hours as needed for severe pain. (Patient not taking: Reported on 08/01/2017) 15 tablet 0   No current facility-administered medications for  this visit.       Physical Exam:   BP 132/83 (BP Location: Right Arm, Patient Position: Sitting, Cuff Size: Normal)   Pulse 81   Resp 18   Ht 6' (1.829 m)   Wt 145 lb 6.4 oz (66 kg)   SpO2 99% Comment: on RA  BMI 19.72 kg/m   General:  Well-appearing  Chest:   Clear to auscultation with symmetrical breath sounds  CV:   Regular rate and rhythm with prominent holosystolic murmur  Incisions:  n/a  Abdomen:  Soft nontender  Extremities:  Warm and well perfused  Diagnostic Tests:  Transthoracic Echocardiography  Patient:    Bilal, Manzer MR #:       [id number] Study Date: 06/08/2017 Gender:     M Age:        40 Height:     182.9 cm Weight:     66.7 kg BSA:        1.83 m^2 Pt. Status: Room:   ATTENDING    Armanda Magic, MD  Asencion Partridge, Jennet Maduro  REFERRING    Rosalio Macadamia  SONOGRAPHER  Clearence Ped, RCS  PERFORMING   [facility], [facility]  cc:  ------------------------------------------------------------------- LV EF: 60% -   65%  ------------------------------------------------------------------- Indications:      Murmur (R01.1).  ------------------------------------------------------------------- History:   PMH:   Mitral valve prolapse.  Risk factors:  Current tobacco use.  -------------------------------------------------------------------  Study Conclusions  - Left ventricle: The cavity size was normal. Wall thickness was   increased in a pattern of mild LVH. Systolic function was normal.   The estimated ejection fraction was in the range of 60% to 65%.   Wall motion was normal; there were no regional wall motion   abnormalities. Left ventricular diastolic function parameters   were normal. - Mitral valve: Mild posterior leaflet prolapse. Mildly thickened   leaflets . There was moderate eccentric regurgitation. - Left atrium: The atrium was mildly dilated.  Impressions:  - Consider TEE for a more comprehensive assessment of mitral    regurgitant jet.  ------------------------------------------------------------------- Study data:  No prior study was available for comparison.  Study status:  Routine.  Procedure:  The patient reported no pain pre or post test. Transthoracic echocardiography. Image quality was adequate.          Transthoracic echocardiography.  M-mode, complete 2D, spectral Doppler, and color Doppler.  Birthdate: Patient birthdate: September 09, 1975.  Age:  Patient is 42 yr old.  Sex: Gender: male.    BMI: 19.9 kg/m^2.  Blood pressure:     120/80 Patient status:  Outpatient.  Study date:  Study date: 06/08/2017. Study time: 03:09 PM.  Location:  [place]  -------------------------------------------------------------------  ------------------------------------------------------------------- Left ventricle:  The cavity size was normal. Wall thickness was increased in a pattern of mild LVH. Systolic function was normal. The estimated ejection fraction was in the range of 60% to 65%. Wall motion was normal; there were no regional wall motion abnormalities. The transmitral flow pattern was normal. The deceleration time of the early transmitral flow velocity was normal. The pulmonary vein flow pattern was normal. The tissue Doppler parameters were normal. Left ventricular diastolic function parameters were normal.  ------------------------------------------------------------------- Aortic valve:   Trileaflet.  Doppler:   There was no stenosis. There was no regurgitation.  ------------------------------------------------------------------- Aorta:  Aortic root: The aortic root was normal in size.  ------------------------------------------------------------------- Mitral valve:  Mild posterior leaflet prolapse.  Mildly thickened leaflets .  Doppler:  There was moderate eccentric regurgitation.  Valve area by pressure half-time: 4.4 cm^2. Indexed valve area by pressure half-time: 2.4 cm^2/m^2.     Peak gradient (D): 6 mm Hg.   ------------------------------------------------------------------- Left atrium:  The atrium was mildly dilated.  ------------------------------------------------------------------- Atrial septum:  No defect or patent foramen ovale was identified.   ------------------------------------------------------------------- Right ventricle:  The cavity size was normal. Wall thickness was normal. Systolic function was normal.  ------------------------------------------------------------------- Pulmonic valve:    The valve appears to be grossly normal. Doppler:  There was no significant regurgitation.  ------------------------------------------------------------------- Tricuspid valve:   Structurally normal valve.   Leaflet separation was normal.  Doppler:  Transvalvular velocity was within the normal range. There was trivial regurgitation.  ------------------------------------------------------------------- Right atrium:  The atrium was normal in size.  ------------------------------------------------------------------- Pericardium:  There was no pericardial effusion.  ------------------------------------------------------------------- Systemic veins: Inferior vena cava: The vessel was normal in size. The respirophasic diameter changes were in the normal range (= 50%), consistent with normal central venous pressure. Diameter: 20 mm.  ------------------------------------------------------------------- Measurements   IVC                                      Value          Reference  ID  20    mm       ----------    Left ventricle                           Value          Reference  LV ID, ED, PLAX chordal          (H)     56    mm       43 - 52  LV ID, ES, PLAX chordal                  38    mm       23 - 38  LV fx shortening, PLAX chordal           32    %        >=29  LV PW thickness, ED                       13    mm       ----------  IVS/LV PW ratio, ED                      0.77           <=1.3  Stroke volume, 2D                        76    ml       ----------  Stroke volume/bsa, 2D                    42    ml/m^2   ----------  LV e&', lateral                           16.2  cm/s     ----------  LV E/e&', lateral                         7.41           ----------  LV e&', medial                            9.57  cm/s     ----------  LV E/e&', medial                          12.54          ----------  LV e&', average                           12.89 cm/s     ----------  LV E/e&', average                         9.31           ----------    Ventricular septum                       Value          Reference  IVS thickness, ED  10    mm       ----------    LVOT                                     Value          Reference  LVOT ID, S                               22    mm       ----------  LVOT area                                3.8   cm^2     ----------  LVOT peak velocity, S                    105   cm/s     ----------  LVOT mean velocity, S                    74.1  cm/s     ----------  LVOT VTI, S                              20    cm       ----------    Aorta                                    Value          Reference  Aortic root ID, ED                       36    mm       ----------    Left atrium                              Value          Reference  LA ID, A-P, ES                           41    mm       ----------  LA ID/bsa, A-P                   (H)     2.24  cm/m^2   <=2.2  LA volume, S                             74.3  ml       ----------  LA volume/bsa, S                         40.6  ml/m^2   ----------  LA volume, ES, 1-p A4C                   60.1  ml       ----------  LA volume/bsa, ES, 1-p A4C  32.8  ml/m^2   ----------  LA volume, ES, 1-p A2C                   92.5  ml       ----------  LA volume/bsa, ES, 1-p A2C               50.5  ml/m^2    ----------    Mitral valve                             Value          Reference  Mitral E-wave peak velocity              120   cm/s     ----------  Mitral A-wave peak velocity              59.7  cm/s     ----------  Mitral deceleration time                 169   ms       150 - 230  Mitral pressure half-time                50    ms       ----------  Mitral peak gradient, D                  6     mm Hg    ----------  Mitral E/A ratio, peak                   2              ----------  Mitral valve area, PHT, DP               4.4   cm^2     ----------  Mitral valve area/bsa, PHT, DP           2.4   cm^2/m^2 ----------  Mitral regurg VTI, PISA                  125   cm       ----------    Pulmonary arteries                       Value          Reference  PA pressure, S, DP                       23    mm Hg    <=30    Tricuspid valve                          Value          Reference  Tricuspid regurg peak velocity           225   cm/s     ----------  Tricuspid peak RV-RA gradient            20    mm Hg    ----------  Tricuspid maximal regurg                 225   cm/s     ----------  velocity, PISA    Right atrium  Value          Reference  RA ID, S-I, ES, A4C              (H)     49.1  mm       34 - 49  RA area, ES, A4C                         15.1  cm^2     8.3 - 19.5  RA volume, ES, A/L                       38.9  ml       ----------  RA volume/bsa, ES, A/L                   21.2  ml/m^2   ----------    Systemic veins                           Value          Reference  Estimated CVP                            3     mm Hg    ----------    Right ventricle                          Value          Reference  TAPSE                                    23    mm       ----------  RV pressure, S, DP                       23    mm Hg    <=30  RV s&', lateral, S                        11.7  cm/s     ----------  Legend: (L)  and  (H)  mark values outside specified  reference range.  ------------------------------------------------------------------- Prepared and Electronically Authenticated by  Prentice Docker, MD 2018-10-10T16:41:08    Transesophageal Echocardiography  Patient:    Quinterious, Walraven MR #:       [id number] Study Date: 06/28/2017 Gender:     M Age:        40 Height:     182.9 cm Weight:     67.7 kg BSA:        1.85 m^2 Pt. Status: Room:   ADMITTING    Donato Schultz, M.D.  ATTENDING    Donato Schultz, M.D.  PERFORMING   Donato Schultz, M.D.  Janalee Dane  REFERRING    Norma Fredrickson C  SONOGRAPHER  Thurman Coyer  cc:  ------------------------------------------------------------------- LV EF: 60% -   65%  ------------------------------------------------------------------- Indications:      Mitral valve prolapse 424.0.  Mitral regurgitation 424.0.  ------------------------------------------------------------------- History:   Risk factors:  Current tobacco use.  ------------------------------------------------------------------- Study Conclusions  - Left ventricle: The cavity size was normal. Wall thickness was   normal. Systolic function was normal. The  estimated ejection   fraction was in the range of 60% to 65%. - Aortic valve: No evidence of vegetation. - Mitral valve: Severe prolapse, involving the posterior leaflet.   No evidence of vegetation. There was severe regurgitation   directed eccentrically wrapping around left atrium. - Left atrium: The atrium was mildly dilated. No evidence of   thrombus in the appendage. - Atrial septum: No defect or patent foramen ovale was identified.  Recommendations:  Will refer to [facility] for mitral valve repair evaluation.  ------------------------------------------------------------------- Study data:   Study status:  Routine.  Consent:  The risks, benefits, and alternatives to the procedure were explained to the patient and informed consent  was obtained.  Procedure:  The patient reported no pain pre or post test. Initial setup. The patient was brought to the laboratory. Surface ECG leads were monitored. Sedation. Conscious sedation was administered by anesthesiology staff. Transesophageal echocardiography. Topical anesthesia was obtained using viscous lidocaine. A transesophageal probe was inserted by the attending cardiologist. Image quality was adequate.  Study completion:  The patient tolerated the procedure well. There were no complications.          Diagnostic transesophageal echocardiography.  2D and color Doppler.  Birthdate:  Patient birthdate: 1976-01-16.  Age:  Patient is 42 yr old.  Sex:  Gender: male.    BMI: 20.3 kg/m^2.  Blood pressure:     139/102  Patient status:  Inpatient.  Study date:  Study date: 06/28/2017. Study time: 08:21 AM.  Location:  Endoscopy.  -------------------------------------------------------------------  ------------------------------------------------------------------- Left ventricle:  The cavity size was normal. Wall thickness was normal. Systolic function was normal. The estimated ejection fraction was in the range of 60% to 65%.  ------------------------------------------------------------------- Aortic valve:   Structurally normal valve.   Cusp separation was normal.  No evidence of vegetation.  Doppler:  There was no regurgitation.  ------------------------------------------------------------------- Aorta:  The aorta was normal, not dilated, and non-diseased.  ------------------------------------------------------------------- Mitral valve:  Leaflet separation was normal.  Severe prolapse, involving the posterior leaflet.  No evidence of vegetation. Doppler:  There was severe regurgitation directed eccentrically wrapping around left atrium.  ------------------------------------------------------------------- Left atrium:  The atrium was mildly dilated.  No evidence  of thrombus in the appendage. The appendage was of normal size. Emptying velocity was normal.  ------------------------------------------------------------------- Atrial septum:  No defect or patent foramen ovale was identified. Echo contrast study showed no right-to-left atrial level shunt, following an increase in RA pressure induced by provocative maneuvers.  ------------------------------------------------------------------- Right ventricle:  The cavity size was normal. Wall thickness was normal. Systolic function was normal.  ------------------------------------------------------------------- Pulmonic valve:    Structurally normal valve.   Cusp separation was normal.  No evidence of vegetation.  ------------------------------------------------------------------- Tricuspid valve:   Structurally normal valve.   Leaflet separation was normal.  No evidence of vegetation.  Doppler:  There was no regurgitation.  ------------------------------------------------------------------- Pulmonary artery:   The main pulmonary artery was normal-sized.  ------------------------------------------------------------------- Right atrium:  The atrium was normal in size.  No evidence of thrombus in the atrial cavity or appendage.  ------------------------------------------------------------------- Pericardium:  The pericardium was normal in appearance. There was no pericardial effusion.  ------------------------------------------------------------------- Systemic veins: Superior vena cava: The study excluded a thrombus.  ------------------------------------------------------------------- Post procedure conclusions Ascending Aorta:  - The aorta was normal, not dilated, and non-diseased.  ------------------------------------------------------------------- Prepared and Electronically Authenticated by  Donato SchultzMark Skains, M.D. 2018-10-30T09:24:29    RIGHT/LEFT HEART CATH AND  CORONARY ANGIOGRAPHY  Conclusion   1. Angiographically normal coronary arteries 2.  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 Normal right heart/PCWP pressures  Indications   Non-rheumatic mitral regurgitation [I34.0 (ICD-10-CM)]  Procedural Details/Technique   Technical Details INDICATION: Severe mitral regurgitation, pre-op study  PROCEDURAL DETAILS: There was an indwelling IV in a right antecubital vein. Using normal sterile technique, the IV was changed out for a 5 Fr brachial sheath over a 0.018 inch wire. The right wrist was then prepped, draped, and anesthetized with 1% lidocaine. Using the modified Seldinger technique a 5/6 French Slender sheath was placed in the right radial artery. Initially I was unable to pass a wire into the artery. Ultrasound guidance was then used with success. Intra-arterial verapamil was administered through the radial artery sheath. IV heparin was administered after a JR4 catheter was advanced into the central aorta. A Swan-Ganz catheter was used for the right heart catheterization. Standard protocol was followed for recording of right heart pressures and sampling of oxygen saturations. Fick cardiac output was calculated. Standard Judkins catheters were used for selective coronary angiography. There were no immediate procedural complications. The patient was transferred to the post catheterization recovery area for further monitoring.     Estimated blood loss <50 mL.  During this procedure the patient was administered the following to achieve and maintain moderate conscious sedation: Versed 6 mg, Fentanyl 75 mcg, while the patient's heart rate, blood pressure, and oxygen saturation were continuously monitored. The period of conscious sedation was 37 minutes, of which I was present face-to-face 100% of this time.  Coronary Findings   Diagnostic  Dominance: Right  Left Anterior  Descending  Vessel is angiographically normal. There is a fistula from the proximal LAD that appears to be connected to the pulmonary artery with flow from the LAD into the PA  Left Circumflex  Vessel is angiographically normal.  Right Coronary Artery  Vessel is angiographically normal.  Intervention   No interventions have been documented.  Right Heart   Right Heart Pressures LV EDP is normal.  Coronary Diagrams   Diagnostic Diagram       Implants        No implant documentation for this case.  MERGE Images   Show images for CARDIAC CATHETERIZATION   Link to Procedure Log   Procedure Log    Hemo Data    Most Recent Value  Fick Cardiac Output 5.56 L/min  Fick Cardiac Output Index 2.94 (L/min)/BSA  RA A Wave 5 mmHg  RA V Wave 4 mmHg  RA Mean 2 mmHg  RV Systolic Pressure 21 mmHg  RV Diastolic Pressure -4 mmHg  RV EDP 2 mmHg  PA Systolic Pressure 21 mmHg  PA Diastolic Pressure 6 mmHg  PA Mean 13 mmHg  PW A Wave 6 mmHg  PW V Wave 7 mmHg  PW Mean 5 mmHg  AO Systolic Pressure 110 mmHg  AO Diastolic Pressure 69 mmHg  AO Mean 86 mmHg  LV Systolic Pressure 108 mmHg  LV Diastolic Pressure -8 mmHg  LV EDP 10 mmHg  Arterial Occlusion Pressure Extended Systolic Pressure 111 mmHg  Arterial Occlusion Pressure Extended Diastolic Pressure 69 mmHg  Arterial Occlusion Pressure Extended Mean Pressure 87 mmHg  Left Ventricular Apex Extended Systolic Pressure 107 mmHg  Left Ventricular Apex Extended Diastolic Pressure 0 mmHg  Left Ventricular Apex Extended EDP Pressure 5 mmHg  QP/QS 0.88  TPVR Index 5.01 HRUI  TSVR Index 29.21 HRUI  PVR SVR Ratio 0.11  TPVR/TSVR Ratio 0.17     CT ANGIOGRAPHY CHEST, ABDOMEN AND PELVIS  TECHNIQUE: Multidetector CT imaging through the chest, abdomen and pelvis was performed using the standard protocol during bolus administration of intravenous contrast. Multiplanar reconstructed images and MIPs were obtained and reviewed  to evaluate the vascular anatomy.  CONTRAST: 75mL ISOVUE-370 IOPAMIDOL (ISOVUE-370) INJECTION 76%  COMPARISON: Chest radiographs 06/10/2017. CT abdomen and pelvis 07/25/2013.  FINDINGS: CTA CHEST FINDINGS  Cardiovascular: There is no evidence of thoracic aortic dissection or aneurysm. A normal variant aortic arch branching pattern is noted with common origin of the brachiocephalic and left common carotid arteries. The heart is enlarged. There is no pericardial effusion.  Mediastinum/Nodes: No enlarged axillary, mediastinal, or hilar lymph nodes. Grossly unremarkable esophagus and thyroid.  Lungs/Pleura: No pleural effusion or pneumothorax. Likely small volume mucous in the distal trachea. Mild paraseptal and centrilobular emphysema. No mass or suspicious nodules. Minimal dependent atelectasis in the lower lobes.  Musculoskeletal: No acute osseous abnormality or suspicious osseous lesion.  Review of the MIP images confirms the above findings.  CTA ABDOMEN AND PELVIS FINDINGS  VASCULAR  Aorta: Patent with mild calcified and soft plaque. No stenosis or dissection.  Celiac: Patent without evidence of aneurysm, dissection, vasculitis or significant stenosis. The right hepatic artery arises from the celiac artery.  SMA: Patent without evidence of aneurysm, dissection, vasculitis or significant stenosis.  Renals: Single patent renal arteries bilaterally without evidence of aneurysm, dissection, vasculitis, fibromuscular dysplasia or significant stenosis.  IMA: Patent without evidence of aneurysm, dissection, vasculitis or significant stenosis.  Inflow: Patent without evidence of aneurysm, dissection, vasculitis or significant stenosis.  Veins: Limited assessment due to contrast timing.  Review of the MIP images confirms the above findings.  NON-VASCULAR  Hepatobiliary: No focal liver abnormality is seen. No gallstones, gallbladder wall  thickening, or biliary dilatation.  Pancreas: Unremarkable.  Spleen: Unremarkable.  Adrenals/Urinary Tract: Unremarkable adrenal glands. Unchanged 2 mm nonobstructing left lower pole renal calculus. No evidence of renal mass or hydronephrosis. Unremarkable bladder.  Stomach/Bowel: The stomach is within normal limits. No bowel dilatation or gross wall thickening. Unremarkable appendix.  Lymphatic: No enlarged or suspicious lymph nodes.  Reproductive: Unremarkable prostate.  Other: No intraperitoneal free fluid. No abdominal wall mass or hernia.  Musculoskeletal: No acute osseous abnormality or suspicious osseous lesion.  Review of the MIP images confirms the above findings.  IMPRESSION: 1. No evidence of aortic dissection, aneurysm, or aortoiliac occlusive disease. 2. Aortic Atherosclerosis (ICD10-I70.0) and Emphysema (ICD10-J43.9). 3. Nonobstructing left nephrolithiasis.   Electronically Signed By: Sebastian Ache M.D. On: 08/01/2017 17:12    Impression:  Patient has mitral valve prolapse with stage D severe symptomatic primary mitral regurgitation.  He describes stable symptoms of exertional shortness of breath and chest tightness consistent with chronic diastolic congestive heart failure, New [employer] Heart Association functional class I-II.  The patient had a syncopal episode recently of unclear etiology.  I have personally reviewed the patient's transthoracic and transesophageal echocardiograms, diagnostic cardiac catheterization, and CT angiogram.  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  There is severe mitral regurgitation.  Left ventricular function remains normal.  Diagnostic cardiac catheterization is notable for the absence of significant coronary artery disease.  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  CT angiography reveals no contraindication to minimally invasive approach for surgery.    Plan:  I have again reviewed the indications, risks, and potential benefits of mitral valve repair with the patient, his mother, and his wife in the office this afternoon.  The natural history of mitral regurgitation was discussed as well as [place]-term prognosis with continued medical therapy.    The likelihood of successful and durable valve repair has been discussed with particular reference to the findings of their recent echocardiogram.  Based upon these findings and previous experience, I have quoted them a greater than 95 percent likelihood of successful valve repair.  The patient understands and accepts all potential risks of surgery including but not limited to risk of death, stroke or other neurologic complication, myocardial infarction, congestive heart failure, respiratory failure, renal failure, bleeding requiring transfusion and/or reexploration, arrhythmia, infection or other wound complications, pneumonia, pleural and/or pericardial effusion, pulmonary embolus, aortic dissection or other major vascular complication, or delayed complications related to valve repair or replacement including but not limited to structural valve deterioration and failure, thrombosis, embolization, endocarditis, or paravalvular leak.  Alternative surgical approaches have been discussed including a comparison between conventional sternotomy and minimally-invasive techniques.  The relative risks and benefits of each have been reviewed as they pertain to the patient's specific circumstances, and all of their questions have been addressed.  Specific risks potentially related to the minimally-invasive approach were discussed at length, including but not limited to risk of  conversion to full or partial sternotomy, aortic dissection or other major vascular complication, unilateral acute lung injury or pulmonary edema, phrenic nerve dysfunction or paralysis, rib fracture, chronic pain, lung hernia, or lymphocele.  All of their questions have been answered.    We plan to proceed with surgery on Wednesday, September 07, 2017.   I spent in excess of 15 minutes during the conduct of this office consultation and >50% of this time involved direct face-to-face encounter with the patient for counseling and/or coordination of their care.   Salvatore Decent. Cornelius Moras, MD 09/05/2017 9:37 AM

## 2017-09-06 MED ORDER — MAGNESIUM SULFATE 50 % IJ SOLN
40.0000 meq | INTRAMUSCULAR | Status: DC
  Filled 2017-09-06: qty 9.85

## 2017-09-06 MED ORDER — KENNESTONE BLOOD CARDIOPLEGIA (KBC) MANNITOL SYRINGE (20%, 32ML)
32.0000 mL | INTRAVENOUS | Status: DC
  Filled 2017-09-06: qty 1

## 2017-09-06 MED ORDER — KENNESTONE BLOOD CARDIOPLEGIA VIAL
13.0000 mL | Status: DC
Start: 2017-09-07 — End: 2017-09-07
  Filled 2017-09-06: qty 1

## 2017-09-06 MED ORDER — DEXTROSE 5 % IV SOLN
0.0000 ug/min | INTRAVENOUS | Status: DC
  Filled 2017-09-06: qty 4

## 2017-09-06 MED ORDER — TRANEXAMIC ACID (OHS) PUMP PRIME SOLUTION
2.0000 mg/kg | INTRAVENOUS | Status: DC
  Filled 2017-09-06: qty 1.32

## 2017-09-06 MED ORDER — CEFUROXIME SODIUM 750 MG IJ SOLR
750.0000 mg | INTRAMUSCULAR | Status: DC
  Filled 2017-09-06: qty 750

## 2017-09-06 MED ORDER — DEXTROSE 5 % IV SOLN
1.5000 g | INTRAVENOUS | Status: AC
  Administered 2017-09-07: 1.5 g via INTRAVENOUS
  Filled 2017-09-06: qty 1.5

## 2017-09-06 MED ORDER — SODIUM CHLORIDE 0.9 % IV SOLN
30.0000 ug/min | INTRAVENOUS | Status: AC
  Administered 2017-09-07: 20 ug/min via INTRAVENOUS
  Filled 2017-09-06: qty 20

## 2017-09-06 MED ORDER — TRANEXAMIC ACID 1000 MG/10ML IV SOLN
1.5000 mg/kg/h | INTRAVENOUS | Status: AC
  Administered 2017-09-07: 1.5 mg/kg/h via INTRAVENOUS
  Filled 2017-09-06: qty 25

## 2017-09-06 MED ORDER — SODIUM CHLORIDE 0.9 % IV SOLN
INTRAVENOUS | Status: DC
  Filled 2017-09-06: qty 30

## 2017-09-06 MED ORDER — KENNESTONE BLOOD CARDIOPLEGIA VIAL
13.0000 mL | Status: DC
  Filled 2017-09-06: qty 1

## 2017-09-06 MED ORDER — VANCOMYCIN HCL 10 G IV SOLR
1250.0000 mg | INTRAVENOUS | Status: AC
  Administered 2017-09-07: 1250 mg via INTRAVENOUS
  Filled 2017-09-06: qty 1250

## 2017-09-06 MED ORDER — SODIUM CHLORIDE 0.9 % IV SOLN
INTRAVENOUS | Status: AC
  Administered 2017-09-07: 1000 mL
  Filled 2017-09-06: qty 1000

## 2017-09-06 MED ORDER — DEXMEDETOMIDINE HCL IN NACL 400 MCG/100ML IV SOLN
0.1000 ug/kg/h | INTRAVENOUS | Status: AC
  Administered 2017-09-07: .5 ug/kg/h via INTRAVENOUS
  Filled 2017-09-06: qty 100

## 2017-09-06 MED ORDER — PLASMA-LYTE 148 IV SOLN
INTRAVENOUS | Status: DC
  Filled 2017-09-06: qty 2.5

## 2017-09-06 MED ORDER — POTASSIUM CHLORIDE 2 MEQ/ML IV SOLN
80.0000 meq | INTRAVENOUS | Status: DC
  Filled 2017-09-06: qty 40

## 2017-09-06 MED ORDER — TRANEXAMIC ACID (OHS) BOLUS VIA INFUSION
15.0000 mg/kg | INTRAVENOUS | Status: AC
  Administered 2017-09-07: 993 mg via INTRAVENOUS
  Filled 2017-09-06: qty 993

## 2017-09-06 MED ORDER — DOPAMINE-DEXTROSE 3.2-5 MG/ML-% IV SOLN
0.0000 ug/kg/min | INTRAVENOUS | Status: DC
  Filled 2017-09-06: qty 250

## 2017-09-06 MED ORDER — NITROGLYCERIN IN D5W 200-5 MCG/ML-% IV SOLN
2.0000 ug/min | INTRAVENOUS | Status: DC
  Filled 2017-09-06: qty 250

## 2017-09-06 MED ORDER — SODIUM CHLORIDE 0.9 % IV SOLN
INTRAVENOUS | Status: AC
  Administered 2017-09-07: .7 [IU]/h via INTRAVENOUS
  Filled 2017-09-06: qty 1

## 2017-09-06 NOTE — Anesthesia Preprocedure Evaluation (Addendum)
Anesthesia Evaluation  Patient identified by MRN, date of birth, ID band Patient awake    Reviewed: Allergy & Precautions, NPO status , Patient's Chart, lab work & pertinent test results  History of Anesthesia Complications Negative for: history of anesthetic complications  Airway Mallampati: II  TM Distance: >3 FB Neck ROM: Full    Dental  (+) Edentulous Upper, Edentulous Lower, Dental Advisory Given   Pulmonary Current Smoker,    Pulmonary exam normal        Cardiovascular + Valvular Problems/Murmurs MR  Rhythm:Regular + Diastolic murmurs Conclusion   1.  Angiographically normal coronary arteries 2.  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 Normal right heart/PCWP pressures  Study Conclusions  - Left ventricle: The cavity size was normal. Wall thickness was   normal. Systolic function was normal. The estimated ejection   fraction was in the range of 60% to 65%. - Aortic valve: No evidence of vegetation. - Mitral valve: Severe prolapse, involving the posterior leaflet.   No evidence of vegetation. There was severe regurgitation   directed eccentrically wrapping around left atrium. - Left atrium: The atrium was mildly dilated. No evidence of   thrombus in the appendage. - Atrial septum: No defect or patent foramen ovale was identified.   Neuro/Psych  Headaches, PSYCHIATRIC DISORDERS Anxiety Depression    GI/Hepatic negative GI ROS, Neg liver ROS,   Endo/Other  negative endocrine ROS  Renal/GU negative Renal ROS  negative genitourinary   Musculoskeletal negative musculoskeletal ROS (+)   Abdominal   Peds negative pediatric ROS (+)  Hematology negative hematology ROS (+)   Anesthesia Other Findings   Reproductive/Obstetrics negative OB ROS                            Anesthesia Physical Anesthesia  Plan  ASA: III  Anesthesia Plan: General   Post-op Pain Management:    Induction: Intravenous  PONV Risk Score and Plan: 2 and Ondansetron and Dexamethasone  Airway Management Planned: Double Lumen EBT  Additional Equipment: Arterial line, 3D TEE, PA Cath and Ultrasound Guidance Line Placement  Intra-op Plan:   Post-operative Plan: Post-operative intubation/ventilation  Informed Consent: I have reviewed the patients History and Physical, chart, labs and discussed the procedure including the risks, benefits and alternatives for the proposed anesthesia with the patient or authorized representative who has indicated his/her understanding and acceptance.   Dental advisory given  Plan Discussed with: CRNA, Anesthesiologist and Surgeon  Anesthesia Plan Comments:        Anesthesia Quick Evaluation

## 2017-09-07 ENCOUNTER — Inpatient Hospital Stay (HOSPITAL_COMMUNITY): Payer: Medicaid Other | Admitting: Certified Registered Nurse Anesthetist

## 2017-09-07 ENCOUNTER — Inpatient Hospital Stay (HOSPITAL_COMMUNITY): Payer: Medicaid Other

## 2017-09-07 ENCOUNTER — Inpatient Hospital Stay (HOSPITAL_COMMUNITY)
Admission: RE | Admit: 2017-09-07 | Discharge: 2017-09-12 | DRG: 220 | Disposition: A | Discharge status: Home / Self Care | Payer: Medicaid Other | Source: Ambulatory Visit | Attending: Thoracic Surgery (Cardiothoracic Vascular Surgery) | Admitting: Thoracic Surgery (Cardiothoracic Vascular Surgery)

## 2017-09-07 ENCOUNTER — Encounter (HOSPITAL_COMMUNITY)
Admission: RE | Disposition: A | Discharge status: Home / Self Care | Payer: Self-pay | Source: Ambulatory Visit | Attending: Thoracic Surgery (Cardiothoracic Vascular Surgery)

## 2017-09-07 ENCOUNTER — Encounter (HOSPITAL_COMMUNITY): Payer: Self-pay | Admitting: *Deleted

## 2017-09-07 DIAGNOSIS — I442 Atrioventricular block, complete: Secondary | ICD-10-CM | POA: Diagnosis not present

## 2017-09-07 DIAGNOSIS — F419 Anxiety disorder, unspecified: Secondary | ICD-10-CM | POA: Diagnosis present

## 2017-09-07 DIAGNOSIS — I700 Atherosclerosis of aorta: Secondary | ICD-10-CM | POA: Diagnosis present

## 2017-09-07 DIAGNOSIS — D62 Acute posthemorrhagic anemia: Secondary | ICD-10-CM | POA: Diagnosis not present

## 2017-09-07 DIAGNOSIS — E162 Hypoglycemia, unspecified: Secondary | ICD-10-CM | POA: Diagnosis not present

## 2017-09-07 DIAGNOSIS — Z79899 Other long term (current) drug therapy: Secondary | ICD-10-CM | POA: Diagnosis not present

## 2017-09-07 DIAGNOSIS — J9811 Atelectasis: Secondary | ICD-10-CM | POA: Diagnosis not present

## 2017-09-07 DIAGNOSIS — E8770 Fluid overload, unspecified: Secondary | ICD-10-CM | POA: Diagnosis not present

## 2017-09-07 DIAGNOSIS — I341 Nonrheumatic mitral (valve) prolapse: Secondary | ICD-10-CM | POA: Diagnosis present

## 2017-09-07 DIAGNOSIS — F17210 Nicotine dependence, cigarettes, uncomplicated: Secondary | ICD-10-CM | POA: Diagnosis present

## 2017-09-07 DIAGNOSIS — R072 Precordial pain: Secondary | ICD-10-CM | POA: Diagnosis present

## 2017-09-07 DIAGNOSIS — N200 Calculus of kidney: Secondary | ICD-10-CM | POA: Diagnosis present

## 2017-09-07 DIAGNOSIS — I110 Hypertensive heart disease with heart failure: Secondary | ICD-10-CM | POA: Diagnosis present

## 2017-09-07 DIAGNOSIS — J9819 Other pulmonary collapse: Secondary | ICD-10-CM | POA: Diagnosis not present

## 2017-09-07 DIAGNOSIS — F329 Major depressive disorder, single episode, unspecified: Secondary | ICD-10-CM | POA: Diagnosis present

## 2017-09-07 DIAGNOSIS — I340 Nonrheumatic mitral (valve) insufficiency: Secondary | ICD-10-CM | POA: Diagnosis present

## 2017-09-07 DIAGNOSIS — I5032 Chronic diastolic (congestive) heart failure: Secondary | ICD-10-CM | POA: Diagnosis present

## 2017-09-07 DIAGNOSIS — Z9889 Other specified postprocedural states: Secondary | ICD-10-CM

## 2017-09-07 DIAGNOSIS — J939 Pneumothorax, unspecified: Secondary | ICD-10-CM

## 2017-09-07 HISTORY — DX: Other specified postprocedural states: Z98.890

## 2017-09-07 HISTORY — PX: TEE WITHOUT CARDIOVERSION: SHX5443

## 2017-09-07 HISTORY — PX: MITRAL VALVE REPAIR: SHX2039

## 2017-09-07 LAB — POCT I-STAT 3, ART BLOOD GAS (G3+)
ACID-BASE DEFICIT: 1 mmol/L (ref 0.0–2.0)
ACID-BASE DEFICIT: 6 mmol/L — AB (ref 0.0–2.0)
ACID-BASE DEFICIT: 4 mmol/L — AB (ref 0.0–2.0)
Acid-base deficit: 5 mmol/L — ABNORMAL HIGH (ref 0.0–2.0)
Acid-base deficit: 7 mmol/L — ABNORMAL HIGH (ref 0.0–2.0)
Acid-base deficit: 4 mmol/L — ABNORMAL HIGH (ref 0.0–2.0)
BICARBONATE: 25.1 mmol/L (ref 20.0–28.0)
BICARBONATE: 22.1 mmol/L (ref 20.0–28.0)
BICARBONATE: 19.8 mmol/L — AB (ref 20.0–28.0)
BICARBONATE: 21 mmol/L (ref 20.0–28.0)
BICARBONATE: 21.7 mmol/L (ref 20.0–28.0)
Bicarbonate: 19.5 mmol/L — ABNORMAL LOW (ref 20.0–28.0)
Bicarbonate: 24.8 mmol/L (ref 20.0–28.0)
O2 SAT: 100 %
O2 SAT: 92 %
O2 SAT: 84 %
O2 SAT: 97 %
O2 Saturation: 98 %
O2 Saturation: 96 %
O2 Saturation: 95 %
PCO2 ART: 48.5 mmHg — AB (ref 32.0–48.0)
PCO2 ART: 40.6 mmHg (ref 32.0–48.0)
PCO2 ART: 39.4 mmHg (ref 32.0–48.0)
PH ART: 7.313 — AB (ref 7.350–7.450)
PH ART: 7.336 — AB (ref 7.350–7.450)
PH ART: 7.376 (ref 7.350–7.450)
PO2 ART: 385 mmHg — AB (ref 83.0–108.0)
PO2 ART: 71 mmHg — AB (ref 83.0–108.0)
PO2 ART: 126 mmHg — AB (ref 83.0–108.0)
PO2 ART: 100 mmHg (ref 83.0–108.0)
PO2 ART: 79 mmHg — AB (ref 83.0–108.0)
Patient temperature: 36.1
Patient temperature: 37.1
Patient temperature: 37.5
Patient temperature: 37.6
TCO2: 27 mmol/L (ref 22–32)
TCO2: 24 mmol/L (ref 22–32)
TCO2: 21 mmol/L — AB (ref 22–32)
TCO2: 21 mmol/L — ABNORMAL LOW (ref 22–32)
TCO2: 22 mmol/L (ref 22–32)
TCO2: 23 mmol/L (ref 22–32)
TCO2: 26 mmol/L (ref 22–32)
pCO2 arterial: 49.1 mmHg — ABNORMAL HIGH (ref 32.0–48.0)
pCO2 arterial: 38.7 mmHg (ref 32.0–48.0)
pCO2 arterial: 43.6 mmHg (ref 32.0–48.0)
pCO2 arterial: 42.6 mmHg (ref 32.0–48.0)
pH, Arterial: 7.318 — ABNORMAL LOW (ref 7.350–7.450)
pH, Arterial: 7.263 — ABNORMAL LOW (ref 7.350–7.450)
pH, Arterial: 7.29 — ABNORMAL LOW (ref 7.350–7.450)
pH, Arterial: 7.307 — ABNORMAL LOW (ref 7.350–7.450)
pO2, Arterial: 51 mmHg — ABNORMAL LOW (ref 83.0–108.0)
pO2, Arterial: 90 mmHg (ref 83.0–108.0)

## 2017-09-07 LAB — CBC
HCT: 34.8 % — ABNORMAL LOW (ref 39.0–52.0)
HEMATOCRIT: 36.2 % — AB (ref 39.0–52.0)
HEMOGLOBIN: 12.1 g/dL — AB (ref 13.0–17.0)
Hemoglobin: 11.5 g/dL — ABNORMAL LOW (ref 13.0–17.0)
MCH: 31.1 pg (ref 26.0–34.0)
MCH: 30.4 pg (ref 26.0–34.0)
MCHC: 33.4 g/dL (ref 30.0–36.0)
MCHC: 33 g/dL (ref 30.0–36.0)
MCV: 93.1 fL (ref 78.0–100.0)
MCV: 92.1 fL (ref 78.0–100.0)
PLATELETS: 154 10*3/uL (ref 150–400)
Platelets: 162 10*3/uL (ref 150–400)
RBC: 3.89 MIL/uL — ABNORMAL LOW (ref 4.22–5.81)
RBC: 3.78 MIL/uL — ABNORMAL LOW (ref 4.22–5.81)
RDW: 14.3 % (ref 11.5–15.5)
RDW: 14.2 % (ref 11.5–15.5)
WBC: 20 10*3/uL — ABNORMAL HIGH (ref 4.0–10.5)
WBC: 18.9 10*3/uL — ABNORMAL HIGH (ref 4.0–10.5)

## 2017-09-07 LAB — POCT I-STAT 4, (NA,K, GLUC, HGB,HCT)
Glucose, Bld: 97 mg/dL (ref 65–99)
HCT: 36 % — ABNORMAL LOW (ref 39.0–52.0)
Hemoglobin: 12.2 g/dL — ABNORMAL LOW (ref 13.0–17.0)
Potassium: 4.1 mmol/L (ref 3.5–5.1)
SODIUM: 144 mmol/L (ref 135–145)

## 2017-09-07 LAB — HEMOGLOBIN AND HEMATOCRIT, BLOOD
HCT: 26.7 % — ABNORMAL LOW (ref 39.0–52.0)
Hemoglobin: 8.8 g/dL — ABNORMAL LOW (ref 13.0–17.0)

## 2017-09-07 LAB — POCT I-STAT, CHEM 8
BUN: 8 mg/dL (ref 6–20)
BUN: 7 mg/dL (ref 6–20)
BUN: 7 mg/dL (ref 6–20)
BUN: 7 mg/dL (ref 6–20)
BUN: 6 mg/dL (ref 6–20)
BUN: 6 mg/dL (ref 6–20)
CALCIUM ION: 1.12 mmol/L — AB (ref 1.15–1.40)
CALCIUM ION: 1.48 mmol/L — AB (ref 1.15–1.40)
CALCIUM ION: 1.23 mmol/L (ref 1.15–1.40)
CHLORIDE: 104 mmol/L (ref 101–111)
CHLORIDE: 107 mmol/L (ref 101–111)
CREATININE: 0.6 mg/dL — AB (ref 0.61–1.24)
CREATININE: 0.6 mg/dL — AB (ref 0.61–1.24)
CREATININE: 0.5 mg/dL — AB (ref 0.61–1.24)
CREATININE: 0.6 mg/dL — AB (ref 0.61–1.24)
CREATININE: 0.6 mg/dL — AB (ref 0.61–1.24)
Calcium, Ion: 1.31 mmol/L (ref 1.15–1.40)
Calcium, Ion: 1.28 mmol/L (ref 1.15–1.40)
Calcium, Ion: 1.12 mmol/L — ABNORMAL LOW (ref 1.15–1.40)
Chloride: 107 mmol/L (ref 101–111)
Chloride: 103 mmol/L (ref 101–111)
Chloride: 105 mmol/L (ref 101–111)
Chloride: 107 mmol/L (ref 101–111)
Creatinine, Ser: 0.6 mg/dL — ABNORMAL LOW (ref 0.61–1.24)
GLUCOSE: 104 mg/dL — AB (ref 65–99)
GLUCOSE: 106 mg/dL — AB (ref 65–99)
GLUCOSE: 135 mg/dL — AB (ref 65–99)
GLUCOSE: 134 mg/dL — AB (ref 65–99)
GLUCOSE: 129 mg/dL — AB (ref 65–99)
Glucose, Bld: 94 mg/dL (ref 65–99)
HCT: 29 % — ABNORMAL LOW (ref 39.0–52.0)
HCT: 26 % — ABNORMAL LOW (ref 39.0–52.0)
HCT: 28 % — ABNORMAL LOW (ref 39.0–52.0)
HCT: 34 % — ABNORMAL LOW (ref 39.0–52.0)
HEMATOCRIT: 38 % — AB (ref 39.0–52.0)
HEMATOCRIT: 35 % — AB (ref 39.0–52.0)
HEMOGLOBIN: 8.8 g/dL — AB (ref 13.0–17.0)
HEMOGLOBIN: 9.5 g/dL — AB (ref 13.0–17.0)
Hemoglobin: 12.9 g/dL — ABNORMAL LOW (ref 13.0–17.0)
Hemoglobin: 11.9 g/dL — ABNORMAL LOW (ref 13.0–17.0)
Hemoglobin: 9.9 g/dL — ABNORMAL LOW (ref 13.0–17.0)
Hemoglobin: 11.6 g/dL — ABNORMAL LOW (ref 13.0–17.0)
Potassium: 3.7 mmol/L (ref 3.5–5.1)
Potassium: 3.9 mmol/L (ref 3.5–5.1)
Potassium: 4.2 mmol/L (ref 3.5–5.1)
Potassium: 4.3 mmol/L (ref 3.5–5.1)
Potassium: 3.9 mmol/L (ref 3.5–5.1)
Potassium: 4.4 mmol/L (ref 3.5–5.1)
SODIUM: 143 mmol/L (ref 135–145)
Sodium: 142 mmol/L (ref 135–145)
Sodium: 142 mmol/L (ref 135–145)
Sodium: 139 mmol/L (ref 135–145)
Sodium: 142 mmol/L (ref 135–145)
Sodium: 141 mmol/L (ref 135–145)
TCO2: 27 mmol/L (ref 22–32)
TCO2: 27 mmol/L (ref 22–32)
TCO2: 27 mmol/L (ref 22–32)
TCO2: 25 mmol/L (ref 22–32)
TCO2: 25 mmol/L (ref 22–32)
TCO2: 22 mmol/L (ref 22–32)

## 2017-09-07 LAB — CREATININE, SERUM
Creatinine, Ser: 0.74 mg/dL (ref 0.61–1.24)
GFR calc non Af Amer: >60 mL/min (ref >60)

## 2017-09-07 LAB — GLUCOSE, CAPILLARY
GLUCOSE-CAPILLARY: 150 mg/dL — AB (ref 65–99)
Glucose-Capillary: 118 mg/dL — ABNORMAL HIGH (ref 65–99)
Glucose-Capillary: 120 mg/dL — ABNORMAL HIGH (ref 65–99)
Glucose-Capillary: 122 mg/dL — ABNORMAL HIGH (ref 65–99)

## 2017-09-07 LAB — PROTIME-INR
INR: 1.22
Prothrombin Time: 15.3 seconds — ABNORMAL HIGH (ref 11.4–15.2)

## 2017-09-07 LAB — ECHO TEE
CHL CUP MV M VEL: 51.2
MVANNULUSVTI: 21.2 cm
Mean grad: 1 mmHg

## 2017-09-07 LAB — MAGNESIUM: Magnesium: 2.8 mg/dL — ABNORMAL HIGH (ref 1.7–2.4)

## 2017-09-07 LAB — APTT: APTT: 29 s (ref 24–36)

## 2017-09-07 LAB — PLATELET COUNT: Platelets: 173 10*3/uL (ref 150–400)

## 2017-09-07 SURGERY — REPAIR, MITRAL VALVE, MINIMALLY INVASIVE
Anesthesia: General | Site: Chest | Laterality: Right

## 2017-09-07 MED ORDER — DEXTROSE 5 % IV SOLN
INTRAVENOUS | Status: DC | PRN
  Administered 2017-09-07: 750 mg via INTRAVENOUS

## 2017-09-07 MED ORDER — SODIUM CHLORIDE 0.9 % IV SOLN
INTRAVENOUS | Status: DC
  Administered 2017-09-07: 15:00:00 via INTRAVENOUS

## 2017-09-07 MED ORDER — ORAL CARE MOUTH RINSE
15.0000 mL | Freq: Four times a day (QID) | OROMUCOSAL | Status: DC
  Administered 2017-09-07: 15 mL via OROMUCOSAL

## 2017-09-07 MED ORDER — DOCUSATE SODIUM 100 MG PO CAPS
200.0000 mg | ORAL_CAPSULE | Freq: Every day | ORAL | Status: DC
  Filled 2017-09-07: qty 2

## 2017-09-07 MED ORDER — PANTOPRAZOLE SODIUM 40 MG PO TBEC
40.0000 mg | DELAYED_RELEASE_TABLET | Freq: Every day | ORAL | Status: DC
  Administered 2017-09-08 – 2017-09-12 (×3): 40 mg via ORAL
  Filled 2017-09-07 (×4): qty 1

## 2017-09-07 MED ORDER — INSULIN ASPART 100 UNIT/ML ~~LOC~~ SOLN
0.0000 [IU] | SUBCUTANEOUS | Status: AC
  Administered 2017-09-07 – 2017-09-08 (×3): 2 [IU] via SUBCUTANEOUS

## 2017-09-07 MED ORDER — MIDAZOLAM HCL 10 MG/2ML IJ SOLN
INTRAMUSCULAR | Status: AC
  Filled 2017-09-07: qty 2

## 2017-09-07 MED ORDER — LACTATED RINGERS IV SOLN
INTRAVENOUS | Status: DC | PRN
  Administered 2017-09-07: 07:00:00 via INTRAVENOUS

## 2017-09-07 MED ORDER — SODIUM CHLORIDE 0.9 % IV SOLN
INTRAVENOUS | Status: DC
  Filled 2017-09-07: qty 1

## 2017-09-07 MED ORDER — ASPIRIN EC 325 MG PO TBEC
325.0000 mg | DELAYED_RELEASE_TABLET | Freq: Every day | ORAL | Status: DC
  Administered 2017-09-08: 325 mg via ORAL
  Filled 2017-09-07: qty 1

## 2017-09-07 MED ORDER — ASPIRIN 81 MG PO CHEW: 324.0000 mg | CHEWABLE_TABLET | Freq: Every day | ORAL | Status: DC

## 2017-09-07 MED ORDER — DEXMEDETOMIDINE HCL 200 MCG/2ML IV SOLN
0.0000 ug/kg/h | INTRAVENOUS | Status: DC
  Administered 2017-09-07: 0.7 ug/kg/h via INTRAVENOUS
  Administered 2017-09-08: 0.3 ug/kg/h via INTRAVENOUS
  Filled 2017-09-07 (×2): qty 2

## 2017-09-07 MED ORDER — PROPOFOL 10 MG/ML IV BOLUS
INTRAVENOUS | Status: AC
  Filled 2017-09-07: qty 20

## 2017-09-07 MED ORDER — CHLORHEXIDINE GLUCONATE 4 % EX LIQD: 30.0000 mL | CUTANEOUS | Status: DC

## 2017-09-07 MED ORDER — MORPHINE SULFATE (PF) 4 MG/ML IV SOLN
1.0000 mg | INTRAVENOUS | Status: DC | PRN
  Administered 2017-09-07 – 2017-09-09 (×12): 2 mg via INTRAVENOUS
  Filled 2017-09-07 (×12): qty 1

## 2017-09-07 MED ORDER — BISACODYL 10 MG RE SUPP: 10.0000 mg | Freq: Every day | RECTAL | Status: DC

## 2017-09-07 MED ORDER — HEPARIN SODIUM (PORCINE) 1000 UNIT/ML IJ SOLN
INTRAMUSCULAR | Status: DC | PRN
  Administered 2017-09-07: 26000 [IU] via INTRAVENOUS

## 2017-09-07 MED ORDER — ACETAMINOPHEN 160 MG/5ML PO SOLN: 650.0000 mg | Freq: Once | ORAL | Status: AC

## 2017-09-07 MED ORDER — SODIUM CHLORIDE 0.9% FLUSH: 10.0000 mL | INTRAVENOUS | Status: DC | PRN

## 2017-09-07 MED ORDER — PROTAMINE SULFATE 10 MG/ML IV SOLN
INTRAVENOUS | Status: AC
  Filled 2017-09-07: qty 25

## 2017-09-07 MED ORDER — 0.9 % SODIUM CHLORIDE (POUR BTL) OPTIME
TOPICAL | Status: DC | PRN
  Administered 2017-09-07: 5000 mL

## 2017-09-07 MED ORDER — HEPARIN SODIUM (PORCINE) 1000 UNIT/ML IJ SOLN
INTRAMUSCULAR | Status: AC
  Filled 2017-09-07: qty 1

## 2017-09-07 MED ORDER — CHLORHEXIDINE GLUCONATE 0.12 % MT SOLN
15.0000 mL | Freq: Once | OROMUCOSAL | Status: AC
  Administered 2017-09-07: 15 mL via OROMUCOSAL

## 2017-09-07 MED ORDER — CHLORHEXIDINE GLUCONATE 0.12% ORAL RINSE (MEDLINE KIT)
15.0000 mL | Freq: Two times a day (BID) | OROMUCOSAL | Status: DC
  Administered 2017-09-07: 15 mL via OROMUCOSAL

## 2017-09-07 MED ORDER — SODIUM BICARBONATE 8.4 % IV SOLN
50.0000 meq | Freq: Once | INTRAVENOUS | Status: AC
  Administered 2017-09-07: 50 meq via INTRAVENOUS

## 2017-09-07 MED ORDER — ROCURONIUM BROMIDE 100 MG/10ML IV SOLN: INTRAVENOUS | Status: DC | PRN

## 2017-09-07 MED ORDER — ACETAMINOPHEN 160 MG/5ML PO SOLN: 1000.0000 mg | Freq: Four times a day (QID) | ORAL | Status: DC

## 2017-09-07 MED ORDER — POTASSIUM CHLORIDE 10 MEQ/50ML IV SOLN: 10.0000 meq | INTRAVENOUS | Status: AC

## 2017-09-07 MED ORDER — LACTATED RINGERS IV SOLN
INTRAVENOUS | Status: DC
  Administered 2017-09-07: 15:00:00 via INTRAVENOUS

## 2017-09-07 MED ORDER — CEFUROXIME SODIUM 1.5 G IV SOLR
1.5000 g | Freq: Two times a day (BID) | INTRAVENOUS | Status: AC
  Administered 2017-09-07 – 2017-09-09 (×4): 1.5 g via INTRAVENOUS
  Filled 2017-09-07 (×4): qty 1.5

## 2017-09-07 MED ORDER — PROTAMINE SULFATE 10 MG/ML IV SOLN
INTRAVENOUS | Status: DC | PRN
  Administered 2017-09-07: 250 mg via INTRAVENOUS

## 2017-09-07 MED ORDER — ACETAMINOPHEN 650 MG RE SUPP
650.0000 mg | Freq: Once | RECTAL | Status: AC
  Administered 2017-09-07: 650 mg via RECTAL

## 2017-09-07 MED ORDER — METOPROLOL TARTRATE 12.5 MG HALF TABLET
ORAL_TABLET | ORAL | Status: AC
  Administered 2017-09-07: 12.5 mg via ORAL
  Filled 2017-09-07: qty 1

## 2017-09-07 MED ORDER — MORPHINE SULFATE (PF) 2 MG/ML IV SOLN: 1.0000 mg | INTRAVENOUS | Status: DC | PRN

## 2017-09-07 MED ORDER — SODIUM CHLORIDE 0.9% FLUSH
3.0000 mL | Freq: Two times a day (BID) | INTRAVENOUS | Status: DC
  Administered 2017-09-08 – 2017-09-11 (×4): 3 mL via INTRAVENOUS

## 2017-09-07 MED ORDER — MIDAZOLAM HCL 2 MG/2ML IJ SOLN: 2.0000 mg | INTRAMUSCULAR | Status: DC | PRN

## 2017-09-07 MED ORDER — BUPIVACAINE 0.5 % ON-Q PUMP SINGLE CATH 400 ML
400.0000 mL | INJECTION | Status: DC
  Filled 2017-09-07: qty 400

## 2017-09-07 MED ORDER — METOPROLOL TARTRATE 12.5 MG HALF TABLET
12.5000 mg | ORAL_TABLET | Freq: Two times a day (BID) | ORAL | Status: DC
  Administered 2017-09-07 – 2017-09-08 (×3): 12.5 mg via ORAL
  Filled 2017-09-07 (×3): qty 1

## 2017-09-07 MED ORDER — BUPIVACAINE HCL (PF) 0.5 % IJ SOLN
INTRAMUSCULAR | Status: DC | PRN
  Administered 2017-09-07: 10 mL

## 2017-09-07 MED ORDER — TRAMADOL HCL 50 MG PO TABS
50.0000 mg | ORAL_TABLET | ORAL | Status: DC | PRN
  Administered 2017-09-09: 100 mg via ORAL
  Filled 2017-09-07 (×2): qty 2

## 2017-09-07 MED ORDER — CHLORHEXIDINE GLUCONATE 0.12 % MT SOLN
15.0000 mL | OROMUCOSAL | Status: AC
  Administered 2017-09-07: 15 mL via OROMUCOSAL

## 2017-09-07 MED ORDER — FAMOTIDINE IN NACL 20-0.9 MG/50ML-% IV SOLN
20.0000 mg | Freq: Two times a day (BID) | INTRAVENOUS | Status: DC
  Administered 2017-09-07: 20 mg via INTRAVENOUS

## 2017-09-07 MED ORDER — ALBUTEROL SULFATE HFA 108 (90 BASE) MCG/ACT IN AERS
INHALATION_SPRAY | RESPIRATORY_TRACT | Status: AC
  Filled 2017-09-07: qty 13.4

## 2017-09-07 MED ORDER — VANCOMYCIN HCL IN DEXTROSE 1-5 GM/200ML-% IV SOLN
1000.0000 mg | Freq: Once | INTRAVENOUS | Status: AC
  Administered 2017-09-07: 1000 mg via INTRAVENOUS
  Filled 2017-09-07: qty 200

## 2017-09-07 MED ORDER — MORPHINE SULFATE (PF) 4 MG/ML IV SOLN
1.0000 mg | INTRAVENOUS | Status: DC | PRN
  Administered 2017-09-07: 2 mg via INTRAVENOUS
  Administered 2017-09-08: 4 mg via INTRAVENOUS
  Filled 2017-09-07 (×2): qty 1

## 2017-09-07 MED ORDER — LACTATED RINGERS IV SOLN: INTRAVENOUS | Status: DC

## 2017-09-07 MED ORDER — CHLORHEXIDINE GLUCONATE 0.12 % MT SOLN
OROMUCOSAL | Status: AC
  Administered 2017-09-07: 15 mL via OROMUCOSAL
  Filled 2017-09-07: qty 15

## 2017-09-07 MED ORDER — LACTATED RINGERS IV SOLN
500.0000 mL | Freq: Once | INTRAVENOUS | Status: AC | PRN
  Administered 2017-09-07: 250 mL via INTRAVENOUS

## 2017-09-07 MED ORDER — SODIUM CHLORIDE 0.9% FLUSH: 3.0000 mL | INTRAVENOUS | Status: DC | PRN

## 2017-09-07 MED ORDER — INSULIN REGULAR BOLUS VIA INFUSION
0.0000 [IU] | Freq: Three times a day (TID) | INTRAVENOUS | Status: DC
  Filled 2017-09-07: qty 10

## 2017-09-07 MED ORDER — CHLORHEXIDINE GLUCONATE CLOTH 2 % EX PADS
6.0000 | MEDICATED_PAD | Freq: Every day | CUTANEOUS | Status: DC
  Administered 2017-09-08: 6 via TOPICAL

## 2017-09-07 MED ORDER — BISACODYL 5 MG PO TBEC
10.0000 mg | DELAYED_RELEASE_TABLET | Freq: Every day | ORAL | Status: DC
  Filled 2017-09-07: qty 2

## 2017-09-07 MED ORDER — OXYCODONE HCL 5 MG PO TABS
5.0000 mg | ORAL_TABLET | ORAL | Status: DC | PRN
  Administered 2017-09-08 – 2017-09-12 (×27): 10 mg via ORAL
  Filled 2017-09-07 (×27): qty 2

## 2017-09-07 MED ORDER — FENTANYL CITRATE (PF) 250 MCG/5ML IJ SOLN
INTRAMUSCULAR | Status: AC
  Filled 2017-09-07: qty 25

## 2017-09-07 MED ORDER — SODIUM CHLORIDE 0.45 % IV SOLN
INTRAVENOUS | Status: DC | PRN
  Administered 2017-09-07: 15:00:00 via INTRAVENOUS

## 2017-09-07 MED ORDER — ROCURONIUM BROMIDE 10 MG/ML (PF) SYRINGE
PREFILLED_SYRINGE | INTRAVENOUS | Status: DC | PRN
  Administered 2017-09-07: 50 mg via INTRAVENOUS
  Administered 2017-09-07: 100 mg via INTRAVENOUS
  Administered 2017-09-07 (×3): 50 mg via INTRAVENOUS
  Administered 2017-09-07: 100 mg via INTRAVENOUS

## 2017-09-07 MED ORDER — ACETAMINOPHEN 500 MG PO TABS
1000.0000 mg | ORAL_TABLET | Freq: Four times a day (QID) | ORAL | Status: DC
  Administered 2017-09-07 – 2017-09-12 (×17): 1000 mg via ORAL
  Filled 2017-09-07 (×19): qty 2

## 2017-09-07 MED ORDER — SODIUM CHLORIDE 0.9 % IV SOLN: INTRAVENOUS | Status: DC

## 2017-09-07 MED ORDER — NITROGLYCERIN IN D5W 200-5 MCG/ML-% IV SOLN: 0.0000 ug/min | INTRAVENOUS | Status: DC

## 2017-09-07 MED ORDER — MIDAZOLAM HCL 5 MG/5ML IJ SOLN
INTRAMUSCULAR | Status: DC | PRN
  Administered 2017-09-07: 2 mg via INTRAVENOUS
  Administered 2017-09-07 (×2): 3 mg via INTRAVENOUS
  Administered 2017-09-07: 2 mg via INTRAVENOUS

## 2017-09-07 MED ORDER — FENTANYL CITRATE (PF) 250 MCG/5ML IJ SOLN
INTRAMUSCULAR | Status: DC | PRN
  Administered 2017-09-07: 100 ug via INTRAVENOUS
  Administered 2017-09-07: 50 ug via INTRAVENOUS
  Administered 2017-09-07: 150 ug via INTRAVENOUS
  Administered 2017-09-07: 450 ug via INTRAVENOUS
  Administered 2017-09-07: 150 ug via INTRAVENOUS
  Administered 2017-09-07: 100 ug via INTRAVENOUS
  Administered 2017-09-07: 150 ug via INTRAVENOUS
  Administered 2017-09-07: 100 ug via INTRAVENOUS

## 2017-09-07 MED ORDER — BUPIVACAINE 0.5 % ON-Q PUMP SINGLE CATH 400 ML
INJECTION | Status: AC | PRN
  Administered 2017-09-07: 400 mL

## 2017-09-07 MED ORDER — BUPIVACAINE HCL (PF) 0.5 % IJ SOLN
INTRAMUSCULAR | Status: AC
  Filled 2017-09-07: qty 10

## 2017-09-07 MED ORDER — METOPROLOL TARTRATE 25 MG/10 ML ORAL SUSPENSION: 12.5000 mg | Freq: Two times a day (BID) | ORAL | Status: DC

## 2017-09-07 MED ORDER — SODIUM CHLORIDE 0.9% FLUSH
10.0000 mL | Freq: Two times a day (BID) | INTRAVENOUS | Status: DC
  Administered 2017-09-09: 10 mL

## 2017-09-07 MED ORDER — ALBUMIN HUMAN 5 % IV SOLN
250.0000 mL | INTRAVENOUS | Status: AC | PRN
  Administered 2017-09-07: 250 mL via INTRAVENOUS

## 2017-09-07 MED ORDER — PROPOFOL 10 MG/ML IV BOLUS
INTRAVENOUS | Status: DC | PRN
  Administered 2017-09-07: 150 mg via INTRAVENOUS

## 2017-09-07 MED ORDER — METOPROLOL TARTRATE 12.5 MG HALF TABLET
12.5000 mg | ORAL_TABLET | Freq: Once | ORAL | Status: AC
  Administered 2017-09-07: 12.5 mg via ORAL

## 2017-09-07 MED ORDER — LACTATED RINGERS IV SOLN
INTRAVENOUS | Status: DC
Start: 2017-09-07 — End: 2017-09-07
  Administered 2017-09-07: 07:00:00 via INTRAVENOUS

## 2017-09-07 MED ORDER — ONDANSETRON HCL 4 MG/2ML IJ SOLN
4.0000 mg | Freq: Four times a day (QID) | INTRAMUSCULAR | Status: DC | PRN
  Administered 2017-09-07: 4 mg via INTRAVENOUS
  Filled 2017-09-07: qty 2

## 2017-09-07 MED ORDER — SODIUM CHLORIDE 0.9 % IV SOLN
0.0000 ug/min | INTRAVENOUS | Status: DC
  Filled 2017-09-07: qty 2

## 2017-09-07 MED ORDER — IPRATROPIUM-ALBUTEROL 0.5-2.5 (3) MG/3ML IN SOLN: 3.0000 mL | RESPIRATORY_TRACT | Status: DC | PRN

## 2017-09-07 MED ORDER — MAGNESIUM SULFATE 4 GM/100ML IV SOLN
4.0000 g | Freq: Once | INTRAVENOUS | Status: AC
  Administered 2017-09-07: 4 g via INTRAVENOUS
  Filled 2017-09-07: qty 100

## 2017-09-07 MED ORDER — SODIUM CHLORIDE 0.9 % IV SOLN: 250.0000 mL | INTRAVENOUS | Status: DC

## 2017-09-07 MED ORDER — METOPROLOL TARTRATE 5 MG/5ML IV SOLN: 2.5000 mg | INTRAVENOUS | Status: DC | PRN

## 2017-09-07 SURGICAL SUPPLY — 101 items
ADAPTER CARDIO PERF ANTE/RETRO (ADAPTER) ×3 IMPLANT
ADH SKN CLS APL DERMABOND .7 (GAUZE/BANDAGES/DRESSINGS) ×4
ADH SKN CLS LQ APL DERMABOND (GAUZE/BANDAGES/DRESSINGS) ×2
ADPR PRFSN 84XANTGRD RTRGD (ADAPTER) ×2
BAG DECANTER FOR FLEXI CONT (MISCELLANEOUS) ×6 IMPLANT
BLADE SURG 11 STRL SS (BLADE) ×3 IMPLANT
BLADE SURG ROTATE 9660 (MISCELLANEOUS) ×1 IMPLANT
CANISTER SUCT 3000ML PPV (MISCELLANEOUS) ×6 IMPLANT
CANNULA FEM VENOUS REMOTE 22FR (CANNULA) ×1 IMPLANT
CANNULA FEMORAL ART 14 SM (MISCELLANEOUS) ×3 IMPLANT
CANNULA GUNDRY RCSP 15FR (MISCELLANEOUS) ×3 IMPLANT
CANNULA OPTISITE PERFUSION 16F (CANNULA) IMPLANT
CANNULA OPTISITE PERFUSION 18F (CANNULA) ×1 IMPLANT
CANNULA SUMP PERICARDIAL (CANNULA) ×6 IMPLANT
CATH KIT ON Q 5IN SLV (PAIN MANAGEMENT) IMPLANT
CATH KIT ON-Q SILVERSOAK 5 (CATHETERS) IMPLANT
CATH KIT ON-Q SILVERSOAK 5IN (CATHETERS) ×3 IMPLANT
CONN ST 1/4X3/8  BEN (MISCELLANEOUS) ×4
CONN ST 1/4X3/8 BEN (MISCELLANEOUS) ×4 IMPLANT
CONNECTOR 1/2X3/8X1/2 3 WAY (MISCELLANEOUS) ×1
CONNECTOR 1/2X3/8X1/2 3WAY (MISCELLANEOUS) ×2 IMPLANT
CONT SPEC 4OZ CLIKSEAL STRL BL (MISCELLANEOUS) ×3 IMPLANT
COVER BACK TABLE 24X17X13 BIG (DRAPES) ×3 IMPLANT
CRADLE DONUT ADULT HEAD (MISCELLANEOUS) ×3 IMPLANT
DERMABOND ADHESIVE PROPEN (GAUZE/BANDAGES/DRESSINGS) ×1
DERMABOND ADVANCED (GAUZE/BANDAGES/DRESSINGS) ×2
DERMABOND ADVANCED .7 DNX12 (GAUZE/BANDAGES/DRESSINGS) ×4 IMPLANT
DERMABOND ADVANCED .7 DNX6 (GAUZE/BANDAGES/DRESSINGS) IMPLANT
DEVICE PMI PUNCTURE CLOSURE (MISCELLANEOUS) ×3 IMPLANT
DEVICE SUT CK QUICK LOAD INDV (Prosthesis & Implant Heart) ×1 IMPLANT
DEVICE SUT CK QUICK LOAD MINI (Prosthesis & Implant Heart) ×2 IMPLANT
DEVICE TROCAR PUNCTURE CLOSURE (ENDOMECHANICALS) ×3 IMPLANT
DRAIN CHANNEL 28F RND 3/8 FF (WOUND CARE) ×6 IMPLANT
DRAPE BILATERAL SPLIT (DRAPES) ×3 IMPLANT
DRAPE C-ARM 42X72 X-RAY (DRAPES) ×3 IMPLANT
DRAPE CV SPLIT W-CLR ANES SCRN (DRAPES) ×3 IMPLANT
DRAPE INCISE IOBAN 66X45 STRL (DRAPES) ×9 IMPLANT
DRAPE SLUSH/WARMER DISC (DRAPES) ×3 IMPLANT
DRSG COVADERM 4X8 (GAUZE/BANDAGES/DRESSINGS) ×3 IMPLANT
ELECT BLADE 6.5 EXT (BLADE) ×3 IMPLANT
ELECT REM PT RETURN 9FT ADLT (ELECTROSURGICAL) ×6
ELECTRODE REM PT RTRN 9FT ADLT (ELECTROSURGICAL) ×4 IMPLANT
FELT TEFLON 1X6 (MISCELLANEOUS) ×6 IMPLANT
FEMORAL VENOUS CANN RAP (CANNULA) IMPLANT
GAUZE SPONGE 4X4 12PLY STRL (GAUZE/BANDAGES/DRESSINGS) ×1 IMPLANT
GLOVE BIO SURGEON STRL SZ 6.5 (GLOVE) ×6 IMPLANT
GLOVE ORTHO TXT STRL SZ7.5 (GLOVE) ×9 IMPLANT
GOWN STRL REUS W/ TWL LRG LVL3 (GOWN DISPOSABLE) ×8 IMPLANT
GOWN STRL REUS W/TWL LRG LVL3 (GOWN DISPOSABLE) ×24
KIT BASIN OR (CUSTOM PROCEDURE TRAY) ×3 IMPLANT
KIT DILATOR VASC 18G NDL (KITS) ×3 IMPLANT
KIT DRAINAGE VACCUM ASSIST (KITS) ×1 IMPLANT
KIT ROOM TURNOVER OR (KITS) ×3 IMPLANT
KIT SUCTION CATH 14FR (SUCTIONS) ×3 IMPLANT
KIT SUT CK MINI COMBO 4X17 (Prosthesis & Implant Heart) ×1 IMPLANT
LEAD PACING MYOCARDI (MISCELLANEOUS) ×3 IMPLANT
LINE VENT (MISCELLANEOUS) ×1 IMPLANT
NDL AORTIC ROOT 14G 7F (CATHETERS) ×2 IMPLANT
NEEDLE AORTIC ROOT 14G 7F (CATHETERS) ×3 IMPLANT
NS IRRIG 1000ML POUR BTL (IV SOLUTION) ×15 IMPLANT
PACK OPEN HEART (CUSTOM PROCEDURE TRAY) ×3 IMPLANT
PAD ARMBOARD 7.5X6 YLW CONV (MISCELLANEOUS) ×6 IMPLANT
PAD ELECT DEFIB RADIOL ZOLL (MISCELLANEOUS) ×3 IMPLANT
RING MITRAL MEMO 3D 34MM SMD34 (Prosthesis & Implant Heart) ×1 IMPLANT
SET CANNULATION TOURNIQUET (MISCELLANEOUS) ×3 IMPLANT
SET CARDIOPLEGIA MPS 5001102 (MISCELLANEOUS) ×1 IMPLANT
SET IRRIG TUBING LAPAROSCOPIC (IRRIGATION / IRRIGATOR) ×3 IMPLANT
SOLUTION ANTI FOG 6CC (MISCELLANEOUS) ×3 IMPLANT
SPONGE GAUZE 4X4 12PLY STER LF (GAUZE/BANDAGES/DRESSINGS) ×3 IMPLANT
SUT BONE WAX W31G (SUTURE) ×3 IMPLANT
SUT E-PACK MINIMALLY INVASIVE (SUTURE) ×3 IMPLANT
SUT ETHIBOND (SUTURE) ×2 IMPLANT
SUT ETHIBOND 2 0 SH (SUTURE) ×1 IMPLANT
SUT ETHIBOND 2-0 RB-1 WHT (SUTURE) ×2 IMPLANT
SUT ETHIBOND X763 2 0 SH 1 (SUTURE) ×3 IMPLANT
SUT GORETEX CV 4 TH 22 36 (SUTURE) ×3 IMPLANT
SUT GORETEX CV-5THC-13 36IN (SUTURE) ×9 IMPLANT
SUT GORETEX CV4 TH-18 (SUTURE) ×6 IMPLANT
SUT PROLENE 3 0 SH1 36 (SUTURE) ×13 IMPLANT
SUT PTFE CHORD X 24MM (SUTURE) ×1 IMPLANT
SUT SILK  1 MH (SUTURE) ×4
SUT SILK 1 MH (SUTURE) IMPLANT
SUT SILK 2 0 SH CR/8 (SUTURE) IMPLANT
SUT SILK 3 0 SH CR/8 (SUTURE) IMPLANT
SUT VIC AB 2-0 CTX 36 (SUTURE) IMPLANT
SUT VIC AB 3-0 SH 8-18 (SUTURE) IMPLANT
SUT VICRYL 2 TP 1 (SUTURE) IMPLANT
SYR 10ML LL (SYRINGE) ×3 IMPLANT
SYSTEM SAHARA CHEST DRAIN ATS (WOUND CARE) ×3 IMPLANT
TAPE CLOTH SURG 4X10 WHT LF (GAUZE/BANDAGES/DRESSINGS) ×1 IMPLANT
TAPE PAPER 2X10 WHT MICROPORE (GAUZE/BANDAGES/DRESSINGS) ×1 IMPLANT
TOWEL GREEN STERILE (TOWEL DISPOSABLE) ×3 IMPLANT
TOWEL GREEN STERILE FF (TOWEL DISPOSABLE) ×3 IMPLANT
TRAY FOLEY SILVER 16FR TEMP (SET/KITS/TRAYS/PACK) ×3 IMPLANT
TROCAR XCEL BLADELESS 5X75MML (TROCAR) ×3 IMPLANT
TROCAR XCEL NON-BLD 11X100MML (ENDOMECHANICALS) ×6 IMPLANT
TUBE SUCT INTRACARD DLP 20F (MISCELLANEOUS) ×3 IMPLANT
TUNNELER SHEATH ON-Q 11GX8 DSP (PAIN MANAGEMENT) ×1 IMPLANT
UNDERPAD 30X30 (UNDERPADS AND DIAPERS) ×3 IMPLANT
WATER STERILE IRR 1000ML POUR (IV SOLUTION) ×6 IMPLANT
WIRE .035 3MM-J 145CM (WIRE) ×3 IMPLANT

## 2017-09-07 NOTE — Progress Notes (Signed)
Follow up ABG drawn 1650. RN commuincated to respiratory. RRT placed pt on 60 FIO2 per protocol. Will continue to monitor.

## 2017-09-07 NOTE — Interval H&P Note (Signed)
History and Physical Interval Note:  09/07/2017 6:52 AM  Steven R Strack Jr.  has presented today for surgery, with the diagnosis of Mitral Regurgitation  The various methods of treatment have been discussed with the patient and family. After consideration of risks, benefits and other options for treatment, the patient has consented to  Procedure(s): MINIMALLY INVASIVE MITRAL VALVE REPAIR (MVR) (Right) TRANSESOPHAGEAL ECHOCARDIOGRAM (TEE) (N/A) as a surgical intervention .  The patient's history has been reviewed, patient examined, no change in status, stable for surgery.  I have reviewed the patient's chart and labs.  Questions were answered to the patient's satisfaction.     Purcell Nailslarence H Owen

## 2017-09-07 NOTE — Anesthesia Procedure Notes (Signed)
Procedure Name: Intubation Date/Time: 09/07/2017 8:54 AM Performed by: Shirlyn Goltz, CRNA Pre-anesthesia Checklist: Patient identified, Emergency Drugs available, Suction available and Patient being monitored Patient Re-evaluated:Patient Re-evaluated prior to induction Oxygen Delivery Method: Circle system utilized Preoxygenation: Pre-oxygenation with 100% oxygen Induction Type: IV induction Ventilation: Mask ventilation without difficulty Laryngoscope Size: Mac and 4 Grade View: Grade I Endobronchial tube: Left, Double lumen EBT, EBT position confirmed by auscultation and EBT position confirmed by fiberoptic bronchoscope and 41 Fr Number of attempts: 1 Airway Equipment and Method: Stylet Placement Confirmation: ETT inserted through vocal cords under direct vision,  positive ETCO2 and breath sounds checked- equal and bilateral Tube secured with: Tape Dental Injury: Teeth and Oropharynx as per pre-operative assessment

## 2017-09-07 NOTE — Progress Notes (Signed)
  Echocardiogram Echocardiogram Transesophageal has been performed.  Janalyn HarderWest, Jettie Lazare R 09/07/2017, 2:42 PM

## 2017-09-07 NOTE — Op Note (Signed)
CARDIOTHORACIC SURGERY OPERATIVE NOTE  Date of Procedure:  09/07/2017  Preoperative Diagnosis: Severe Mitral Regurgitation  Postoperative Diagnosis: Same  Procedure:    Minimally-Invasive Mitral Valve Repair  Complex valvuloplasty including triangular resection of posterior leaflet  Artificial Gore-tex neochord placement x6  Sorin Memo 3D Ring Annuloplasty (size 34 mm, catalog # [id number], serial # [id number])    Surgeon: Salvatore Decent. 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  Approximately 1-1/2 years ago the patient was incarcerated in prison.  The prison physician noted a loud systolic murmur on exam.  An echocardiogram was performed and reportedly revealed mitral valve prolapse with at least moderate mitral regurgitation and normal left ventricular systolic function.  He completed his 85-month prison sentence and has since then completed his parole.   He was referred to Dr. Anne Fu who first evaluated the patient in February of this year.  An echocardiogram was ordered but never completed.  The patient returns for follow-up and was evaluated by Norma Fredrickson last month.  Subsequent transthoracic echocardiogram performed June 08, 2017 revealed normal left ventricular systolic function with ejection  fraction estimated 60-65% there was mitral valve prolapse with at least moderate mitral regurgitation.  Transesophageal echocardiogram was performed June 28, 2017 and revealed severe prolapse involving the posterior leaflet with severe mitral regurgitation.  Left ventricular systolic function was normal.  The patient was referred for elective surgical consultation.  The patient has been seen in consultation and counseled at length regarding the indications, risks and potential benefits of surgery.  All questions have been answered, and the patient provides full informed consent for the operation as described.    DETAILS OF THE OPERATIVE PROCEDURE  Preparation:  The patient is brought to the operating room on the above mentioned date and central monitoring was established by the anesthesia team including placement of Swan-Ganz catheter through the left internal jugular vein.  A radial arterial line is placed. The patient is placed in the supine position on the operating table.  Intravenous antibiotics are administered. General endotracheal anesthesia is induced uneventfully. The patient is initially intubated using a dual lumen endotracheal tube.  A Foley catheter is placed.  Baseline transesophageal echocardiogram was performed.  Findings were notable for myxomatous type degenerative disease of the mitral valve with a large prolapsing segment involving a portion of the posterior leaflet (P2).  There were multiple elongated chordae tendinae.  There was severe mitral regurgitation with an eccentric jet of mitral regurgitation coursing around the left atrium.  Left ventricular size and function appeared normal.  No other significant abnormalities were noted.  A soft roll is placed behind the patient's left scapula and the neck gently extended and turned to the left.   The patient's right neck, chest, abdomen, both groins, and both lower extremities are prepared and draped in a sterile manner.  A time out  procedure is performed.  Surgical Approach:  A right miniature anterolateral thoracotomy incision is performed. The incision is placed just lateral to and superior to the right nipple. The pectoralis major muscle is retracted medially and completely preserved. The right pleural space is entered through the 3rd intercostal space. A soft tissue retractor is placed.  Two 11 mm ports are placed through separate stab incisions inferiorly. The right pleural space is insufflated continuously with carbon dioxide gas through the posterior port during the remainder of the operation.  A pledgeted sutures placed through the dome of the right hemidiaphragm and retracted inferiorly to facilitate exposure.  A longitudinal incision is made in the pericardium 3 cm anterior to the phrenic nerve and silk traction sutures are placed on either side of the incision for exposure.   Extracorporeal Cardiopulmonary Bypass and Myocardial Protection:  A small incision is made in the right inguinal crease and the anterior surface of the right common femoral artery and right common femoral vein are identified.  The patient is placed in Trendelenburg position. The right internal jugular vein is cannulated with Seldinger technique and a guidewire advanced into the right atrium. The patient is heparinized systemically. The right internal jugular vein is cannulated with a 14 [place] pediatric femoral venous cannula. Pursestring sutures are placed on the anterior surface of the right common femoral vein and right common femoral artery. The right common femoral vein is cannulated with the Seldinger technique and a guidewire is advanced under transesophageal echocardiogram guidance through the right atrium. The femoral vein is cannulated with a Meisinger 22 French femoral venous cannula. The right common femoral artery is cannulated with Seldinger technique and a flexible guidewire is advanced until it can be appreciated intraluminally in the  descending thoracic aorta on transesophageal echocardiogram. The femoral artery is cannulated with an 18 French femoral arterial cannula.  Adequate heparinization is verified.     The entire pre-bypass portion of the operation was notable for stable hemodynamics.  Cardiopulmonary bypass was begun.  Vacuum assist venous drainage is utilized. The incision in the pericardium is extended in both directions. Venous drainage and exposure are notably excellent. A retrograde cardioplegia cannula is placed through the right atrium into the coronary sinus using transesophageal echocardiogram guidance.  An antegrade cardioplegia cannula is placed in the ascending aorta.    The patient is cooled to 28C systemic temperature.  The aortic cross clamp is applied and cardioplegia is delivered initially in an antegrade fashion through the aortic root using modified [place] cold blood cardioplegia ([place] blood cardioplegia protocol).   The initial cardioplegic arrest is rapid with early diastolic arrest.  Repeat doses of cardioplegia are administered at 90 minutes and every 30 minutes thereafter through the coronary sinus catheter in order to maintain completely flat electrocardiogram.  Myocardial protection was felt to be excellent.   Mitral Valve Repair:  A left atriotomy incision was performed through the interatrial groove and extended partially across the back wall of the left atrium after opening the oblique sinus inferiorly.  The mitral valve is exposed using a self-retaining retractor.  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  There was severe prolapse involving a portion of the middle scallop (P2) of the posterior leaflet.  The severe prolapse involving the portion of P2 on the anterior side of midline.  There were multiple elongated chordae tendinae.  There were no ruptured chordae.  There was redundant leaflet  tissue.  There was  billowing without significant prolapse involving the contralateral side of P2.  Interrupted 2-0 Ethibond horizontal mattress sutures are placed circumferentially around the entire mitral valve annulus. The sutures will ultimately be utilized for ring annuloplasty, and at this juncture there are utilized to suspend the valve symmetrically.  The severe prolapsing portion of P2 was repaired using a simple triangular resection.  Approximately 20% of the total surface area of P2 was resected.  The intervening vertical defect was closed using simple interrupted CV 5 Gore-Tex suture.  Artificial neochord placement was performed using Chord-X multi-strand CV-4 Goretex pre-measured loops.  The appropriate cord length was measured from corresponding normal length primary cords from the P1 segment of the posterior leaflet. The papillary muscle suture of the Chord-X multi-strand suture was placed through the head of the anterior papillary muscle in a horizontal mattress fashion and tied over Teflon felt pledgets. Each of the three pre-measured loops were then reimplanted into the free margin of the P2 segment of the posterior leaflet.    The valve was tested with saline and appeared competent even without ring annuloplasty complete. The valve was sized to a 34 mm annuloplasty ring, based upon the transverse distance between the left and right commissures and the height of the anterior leaflet, corresponding to a size just slightly larger than the overall surface area of the anterior leaflet.  A Sorin Memo 3D annuloplasty ring (size 34mm, catalog [id number], serial [id number]) was secured in place uneventfully. All ring sutures were secured using a Cor-knot device.    The valve was tested with saline and appeared competent. There is no residual leak. There was a broad, symmetrical line of coaptation of the anterior and posterior leaflet which was confirmed using the blue ink test.  Rewarming is begun.   Procedure  Completion:  The atriotomy was closed using a 2-layer closure of running 3-0 Prolene suture after placing a sump drain across the mitral valve to serve as a left ventricular vent.  One final dose of warm retrograde "reanimation dose" cardioplegia was administered retrograde through the coronary sinus catheter while all air was evacuated through the aortic root.  The aortic cross clamp was removed after a total cross clamp time of 109 minutes.  Epicardial pacing wires are fixed to the inferior wall of the right ventricule and to the right atrial appendage. The patient is rewarmed to 37C temperature. The left ventricular vent is removed.  The patient is ventilated and flow volumes turndown while the mitral valve repair is inspected using transesophageal echocardiogram. The valve repair appears intact with no residual leak. The antegrade cardioplegia cannula is now removed. The patient is weaned and disconnected from cardiopulmonary bypass.  The patient's rhythm at separation from bypass was sinus.  The patient was weaned from bypass without any inotropic support. Total cardiopulmonary bypass time for the operation was 153 minutes.  Followup transesophageal echocardiogram performed after separation from bypass revealed a well-seated annuloplasty ring in the mitral position with a normal functioning mitral valve. There was no residual leak.  Left ventricular function was unchanged from preoperatively.  The mean gradient across the mitral valve was estimated to be 4 mmHg.  The femoral arterial and venous cannulae were removed uneventfully. There was a palpable pulse in the distal right common femoral artery after removal of the cannula. Protamine was administered to reverse the anticoagulation. The right internal jugular cannula was removed and manual pressure held on the neck for 15 minutes.  Single lung ventilation  was begun. The atriotomy closure was inspected for hemostasis. The pericardial sac was drained  using a 28 French Bard drain placed through the anterior port incision.  The right pleural space is irrigated with saline solution and inspected for hemostasis. The On-Q postoperative pain management system was used.  A single lumen On-Q catheter was tunneled through the subcutaneous tissues to the posterior port incision and then tunneled through the chest wall into the right pleural space.  The catheter was tunneled into the subpleural space posteriorly to cover the second through the sixth intercostal nerve roots.  The catheter was flushed with 0.5% bupivacaine solution and ultimately connected to a continuous infusion pump.  The right pleural space was drained using a 28 French Bard drain placed through the posterior port incision. The miniature thoracotomy incision was closed in multiple layers in routine fashion. The right groin incision was inspected for hemostasis and closed in multiple layers in routine fashion.  The post-bypass portion of the operation was notable for stable rhythm and hemodynamics.  No blood products were administered during the operation.   Disposition:  The patient tolerated the procedure well.  The patient was reintubated using a single lumen endotracheal tube and subsequently transported to the surgical intensive care unit in stable condition. There were no intraoperative complications. All sponge instrument and needle counts are verified correct at completion of the operation.     Salvatore Decentlarence H. Cornelius Moraswen MD 09/07/2017 2:18 PM

## 2017-09-07 NOTE — Procedures (Signed)
Extubation Procedure Note  Patient Details:   Name: Steven BorerDonnie R Mainwaring Jr. DOB: Jan 15, 1976 MRN: [id number]  Patient completed cardiac rapid wean. PT follows all commands. Nif -20 VC 1.4L Cuff leak positive pt extubated to 2L [place] no stridor sats 100%     Evaluation  O2 sats: stable throughout Complications: No apparent complications Patient did tolerate procedure well. Bilateral Breath Sounds: Rhonchi   Yes  Phill MyronJessica L Smith Allen 09/07/2017, 8:50pm

## 2017-09-07 NOTE — Progress Notes (Signed)
Dr. Cornelius Moraswen aware of ABG drawn at 1519. Orders to up respiratory rate to 16 and do a recruitment maneuver. RN communicated to respiratory.

## 2017-09-07 NOTE — Anesthesia Procedure Notes (Signed)
Performed by: Poseidon Pam M, CRNA       

## 2017-09-07 NOTE — Progress Notes (Signed)
RT Note: Cardiac rapid wean initiated

## 2017-09-07 NOTE — Anesthesia Procedure Notes (Signed)
Arterial Line Insertion Start/End1/04/2018 7:15 AM, 09/07/2017 7:25 AM Performed by: Adonis Housekeeperongell, Ahuva Poynor M, CRNA, CRNA  Patient location: Pre-op. Preanesthetic checklist: patient identified, IV checked, site marked, risks and benefits discussed, surgical consent, monitors and equipment checked and pre-op evaluation Lidocaine 1% used for infiltration and patient sedated Left, radial was placed Catheter size: 20 G Hand hygiene performed , maximum sterile barriers used  and Seldinger technique used Allen's test indicative of satisfactory collateral circulation Attempts: 3 Procedure performed without using ultrasound guided technique. Ultrasound Notes:anatomy identified, needle tip was noted to be adjacent to the nerve/plexus identified and no ultrasound evidence of intravascular and/or intraneural injection Following insertion, Biopatch and dressing applied. Post procedure assessment: normal  Post procedure complications: local hematoma (right side). Patient tolerated the procedure with difficulty.

## 2017-09-07 NOTE — Transfer of Care (Signed)
Immediate Anesthesia Transfer of Care Note  Patient: Aleatha BorerDonnie R Wilhelmsen Jr.  Procedure(s) Performed: MINIMALLY INVASIVE MITRAL VALVE REPAIR (MVR) using Sorin Memo 3D Ring size 34 (Right Chest) TRANSESOPHAGEAL ECHOCARDIOGRAM (TEE) (N/A )  Patient Location: ICU  Anesthesia Type:General  Level of Consciousness: sedated and Patient remains intubated per anesthesia plan  Airway & Oxygen Therapy: Patient remains intubated per anesthesia plan and Patient placed on Ventilator (see vital sign flow sheet for setting)  Post-op Assessment: Report given to RN and Post -op Vital signs reviewed and stable  Post vital signs: Reviewed and stable  Last Vitals:  Vitals:   09/07/17 0714 09/07/17 1446  BP: (!) 109/59   Pulse: 66   Resp: 18 14  Temp: 36.7 C   SpO2: 98%     Last Pain:  Vitals:   09/07/17 0714  TempSrc: Oral         Complications: No apparent anesthesia complications

## 2017-09-07 NOTE — Anesthesia Procedure Notes (Signed)
Central Venous Catheter Insertion Performed by: Heather RobertsSinger, Adedamola Seto, MD, anesthesiologist Start/End1/04/2018 8:44 AM, 09/07/2017 8:58 AM Patient location: Pre-op. Preanesthetic checklist: patient identified, IV checked, site marked, risks and benefits discussed, surgical consent, monitors and equipment checked, pre-op evaluation, timeout performed and anesthesia consent Position: Trendelenburg Lidocaine 1% used for infiltration and patient sedated Hand hygiene performed , maximum sterile barriers used  and Seldinger technique used Catheter size: 8.5 Fr Total catheter length 8. PA cath was placed.Sheath introducer Swan type:thermodilution PA Cath depth:50 Procedure performed using ultrasound guided technique. Ultrasound Notes:anatomy identified, needle tip was noted to be adjacent to the nerve/plexus identified, no ultrasound evidence of intravascular and/or intraneural injection and image(s) printed for medical record Attempts: 1 Following insertion, line sutured and dressing applied. Post procedure assessment: free fluid flow, blood return through all ports and no air  Patient tolerated the procedure well with no immediate complications.

## 2017-09-07 NOTE — Progress Notes (Signed)
      [address]       [place] [id number]             [phone]      S/p MV repair  Intubated, sedated  On 60% FiO2- has had recruitment x 2 for atelectasis  BP 97/67   Pulse 92   Temp 98.6 F (37 C)   Resp 19   Ht 6' (1.829 m)   Wt 145 lb 14 oz (66.2 kg)   SpO2 95%   BMI 19.78 kg/m  20/13 CI= 2.1   Intake/Output Summary (Last 24 hours) at 09/07/2017 1833 Last data filed at 09/07/2017 1800 Gross per 24 hour  Intake 3545.06 ml  Output 1945 ml  Net 1600.06 ml    Wean vent as oxygenation allows  Viviann SpareSteven C. Dorris FetchHendrickson, MD [facility] [phone]

## 2017-09-07 NOTE — Anesthesia Postprocedure Evaluation (Signed)
Anesthesia Post Note  Patient: Aleatha BorerDonnie R Ruppel Jr.  Procedure(s) Performed: MINIMALLY INVASIVE MITRAL VALVE REPAIR (MVR) using Sorin Memo 3D Ring size 34 (Right Chest) TRANSESOPHAGEAL ECHOCARDIOGRAM (TEE) (N/A )     Patient location during evaluation: SICU Anesthesia Type: General Level of consciousness: sedated Pain management: pain level controlled Vital Signs Assessment: post-procedure vital signs reviewed and stable Respiratory status: patient remains intubated per anesthesia plan Cardiovascular status: stable Postop Assessment: no apparent nausea or vomiting Anesthetic complications: no    Last Vitals:  Vitals:   09/07/17 1530 09/07/17 1545  BP: 108/87 (!) 119/99  Pulse: 91 90  Resp: (!) 24 10  Temp: (!) 36 C (!) 36 C  SpO2: 93% 100%    Last Pain:  Vitals:   09/07/17 1500  TempSrc: Core (Comment)                 Clarke Amburn DANIEL

## 2017-09-07 NOTE — Progress Notes (Signed)
Dr. Dorris FetchHendrickson aware of ABG at 1834. Orders for one amp of bicarb and to start weaning his FiO2. Will follow orders and continue to monitor.

## 2017-09-07 NOTE — Brief Op Note (Signed)
09/07/2017  2:15 PM  PATIENT:  Steven Boreronnie R Sabino Jr.  42 y.o. male  PRE-OPERATIVE DIAGNOSIS:  Mitral Regurgitation  POST-OPERATIVE DIAGNOSIS:  Mitral Regurgitation  PROCEDURE:  Procedure(s):  MINIMALLY INVASIVE MITRAL VALVE REPAIR  -Triangular Resection of Posterior Leaflet -Placement of Neo Chords x 6 -Ring Annuloplasty with a 34 Sorin Memo 3D Ring  TRANSESOPHAGEAL ECHOCARDIOGRAM (TEE) (N/A)  SURGEON:  Surgeon(s) and Role:    * Purcell Nailswen, Clarence H, MD - Primary  PHYSICIAN ASSISTANT: Erin Barrett PA-C  ANESTHESIA:   general  EBL:  900 mL   BLOOD ADMINISTERED: CELLSAVER  DRAINS: Right Pleural Chest Tube   LOCAL MEDICATIONS USED:  NONE  SPECIMEN:  Source of Specimen:  Portion of Posterior Mitral Valve Leaflet  DISPOSITION OF SPECIMEN:  PATHOLOGY  COUNTS:  YES  TOURNIQUET:  * No tourniquets in log *  DICTATION: .Dragon Dictation  PLAN OF CARE: Admit to inpatient   PATIENT DISPOSITION:  ICU - intubated and hemodynamically stable.   Delay start of Pharmacological VTE agent (>24hrs) due to surgical blood loss or risk of bleeding: yes

## 2017-09-08 ENCOUNTER — Other Ambulatory Visit: Payer: Self-pay

## 2017-09-08 ENCOUNTER — Encounter (HOSPITAL_COMMUNITY): Payer: Self-pay | Admitting: Anesthesiology

## 2017-09-08 ENCOUNTER — Inpatient Hospital Stay (HOSPITAL_COMMUNITY): Payer: Medicaid Other

## 2017-09-08 LAB — GLUCOSE, CAPILLARY
Glucose-Capillary: 116 mg/dL — ABNORMAL HIGH (ref 65–99)
Glucose-Capillary: 124 mg/dL — ABNORMAL HIGH (ref 65–99)
Glucose-Capillary: 109 mg/dL — ABNORMAL HIGH (ref 65–99)
Glucose-Capillary: 126 mg/dL — ABNORMAL HIGH (ref 65–99)

## 2017-09-08 LAB — BASIC METABOLIC PANEL
Anion gap: 4 — ABNORMAL LOW (ref 5–15)
BUN: 7 mg/dL (ref 6–20)
CALCIUM: 8 mg/dL — AB (ref 8.9–10.3)
CHLORIDE: 109 mmol/L (ref 101–111)
CO2: 25 mmol/L (ref 22–32)
CREATININE: 0.75 mg/dL (ref 0.61–1.24)
GFR calc non Af Amer: >60 mL/min (ref >60)
Glucose, Bld: 120 mg/dL — ABNORMAL HIGH (ref 65–99)
Potassium: 4.3 mmol/L (ref 3.5–5.1)
SODIUM: 138 mmol/L (ref 135–145)

## 2017-09-08 LAB — CBC
HCT: 33.5 % — ABNORMAL LOW (ref 39.0–52.0)
HCT: 33.7 % — ABNORMAL LOW (ref 39.0–52.0)
HEMOGLOBIN: 11.1 g/dL — AB (ref 13.0–17.0)
Hemoglobin: 11 g/dL — ABNORMAL LOW (ref 13.0–17.0)
MCH: 30.4 pg (ref 26.0–34.0)
MCH: 30.1 pg (ref 26.0–34.0)
MCHC: 33.1 g/dL (ref 30.0–36.0)
MCHC: 32.6 g/dL (ref 30.0–36.0)
MCV: 91.8 fL (ref 78.0–100.0)
MCV: 92.3 fL (ref 78.0–100.0)
PLATELETS: 161 10*3/uL (ref 150–400)
Platelets: 169 10*3/uL (ref 150–400)
RBC: 3.65 MIL/uL — ABNORMAL LOW (ref 4.22–5.81)
RBC: 3.65 MIL/uL — ABNORMAL LOW (ref 4.22–5.81)
RDW: 14.4 % (ref 11.5–15.5)
RDW: 14.4 % (ref 11.5–15.5)
WBC: 13.6 10*3/uL — ABNORMAL HIGH (ref 4.0–10.5)
WBC: 16.1 10*3/uL — ABNORMAL HIGH (ref 4.0–10.5)

## 2017-09-08 LAB — CREATININE, SERUM: CREATININE: 0.85 mg/dL (ref 0.61–1.24)

## 2017-09-08 LAB — POCT I-STAT, CHEM 8
BUN: 10 mg/dL (ref 6–20)
CHLORIDE: 101 mmol/L (ref 101–111)
Calcium, Ion: 1.22 mmol/L (ref 1.15–1.40)
Creatinine, Ser: 0.7 mg/dL (ref 0.61–1.24)
Glucose, Bld: 111 mg/dL — ABNORMAL HIGH (ref 65–99)
HEMATOCRIT: 35 % — AB (ref 39.0–52.0)
Hemoglobin: 11.9 g/dL — ABNORMAL LOW (ref 13.0–17.0)
Potassium: 4.1 mmol/L (ref 3.5–5.1)
SODIUM: 137 mmol/L (ref 135–145)
TCO2: 25 mmol/L (ref 22–32)

## 2017-09-08 LAB — MAGNESIUM
MAGNESIUM: 2 mg/dL (ref 1.7–2.4)
MAGNESIUM: 1.8 mg/dL (ref 1.7–2.4)

## 2017-09-08 MED ORDER — ENOXAPARIN SODIUM 40 MG/0.4ML ~~LOC~~ SOLN
40.0000 mg | Freq: Every day | SUBCUTANEOUS | Status: DC
  Filled 2017-09-08 (×2): qty 0.4

## 2017-09-08 MED ORDER — WARFARIN - PHYSICIAN DOSING INPATIENT: Freq: Every day | Status: DC

## 2017-09-08 MED ORDER — POTASSIUM CHLORIDE CRYS ER 20 MEQ PO TBCR
20.0000 meq | EXTENDED_RELEASE_TABLET | Freq: Every day | ORAL | Status: DC
  Administered 2017-09-08 – 2017-09-12 (×3): 20 meq via ORAL
  Filled 2017-09-08 (×4): qty 1

## 2017-09-08 MED ORDER — WARFARIN SODIUM 2.5 MG PO TABS
2.5000 mg | ORAL_TABLET | Freq: Every day | ORAL | Status: DC
  Administered 2017-09-08 – 2017-09-09 (×2): 2.5 mg via ORAL
  Filled 2017-09-08 (×2): qty 1

## 2017-09-08 MED ORDER — FUROSEMIDE 10 MG/ML IJ SOLN
40.0000 mg | Freq: Once | INTRAMUSCULAR | Status: AC
  Administered 2017-09-08: 40 mg via INTRAVENOUS
  Filled 2017-09-08: qty 4

## 2017-09-08 MED ORDER — INSULIN ASPART 100 UNIT/ML ~~LOC~~ SOLN
0.0000 [IU] | SUBCUTANEOUS | Status: DC
  Administered 2017-09-08: 2 [IU] via SUBCUTANEOUS

## 2017-09-08 MED ORDER — ALPRAZOLAM 0.5 MG PO TABS
0.5000 mg | ORAL_TABLET | Freq: Three times a day (TID) | ORAL | Status: DC | PRN
  Administered 2017-09-08 – 2017-09-12 (×10): 0.5 mg via ORAL
  Filled 2017-09-08 (×12): qty 1

## 2017-09-08 MED ORDER — KETOROLAC TROMETHAMINE 15 MG/ML IJ SOLN
15.0000 mg | Freq: Four times a day (QID) | INTRAMUSCULAR | Status: DC | PRN
  Administered 2017-09-08 – 2017-09-11 (×10): 15 mg via INTRAVENOUS
  Filled 2017-09-08 (×10): qty 1

## 2017-09-08 MED FILL — Heparin Sodium (Porcine) Inj 1000 Unit/ML: INTRAMUSCULAR | Qty: 20 | Status: AC

## 2017-09-08 MED FILL — Sodium Chloride IV Soln 0.9%: INTRAVENOUS | Qty: 2000 | Status: AC

## 2017-09-08 MED FILL — Electrolyte-R (PH 7.4) Solution: INTRAVENOUS | Qty: 4000 | Status: AC

## 2017-09-08 MED FILL — Mannitol IV Soln 20%: INTRAVENOUS | Qty: 1000 | Status: AC

## 2017-09-08 MED FILL — Sodium Bicarbonate IV Soln 8.4%: INTRAVENOUS | Qty: 50 | Status: AC

## 2017-09-08 MED FILL — Lidocaine HCl IV Inj 20 MG/ML: INTRAVENOUS | Qty: 25 | Status: AC

## 2017-09-08 NOTE — Progress Notes (Signed)
Patient ID: Steven Boreronnie R Deviney Jr., male   DOB: 1975-10-02, 42 y.o.   MRN: [id number] [facility] Evening Rounds  Hemodynamically stable in sinus rhythm 67  sats 94%  Urine output good  BMET    Component Value Date/Time   NA 137 09/08/2017 1650   NA 141 06/06/2017 1552   K 4.1 09/08/2017 1650   CL 101 09/08/2017 1650   CO2 25 09/08/2017 0411   GLUCOSE 111 (H) 09/08/2017 1650   BUN 10 09/08/2017 1650   BUN 13 06/06/2017 1552   CREATININE 0.70 09/08/2017 1650   CALCIUM 8.0 (L) 09/08/2017 0411   GFRNONAA >60 09/08/2017 1627   GFRAA >60 09/08/2017 1627   CBC    Component Value Date/Time   WBC 16.1 (H) 09/08/2017 1627   RBC 3.65 (L) 09/08/2017 1627   HGB 11.9 (L) 09/08/2017 1650   HGB 14.6 06/06/2017 1552   HCT 35.0 (L) 09/08/2017 1650   HCT 43.6 06/06/2017 1552   PLT 161 09/08/2017 1627   PLT 298 06/06/2017 1552   MCV 92.3 09/08/2017 1627   MCV 91 06/06/2017 1552   MCH 30.1 09/08/2017 1627   MCHC 32.6 09/08/2017 1627   RDW 14.4 09/08/2017 1627   RDW 14.3 06/06/2017 1552   LYMPHSABS 1.7 07/29/2013 1455   MONOABS 0.5 07/29/2013 1455   EOSABS 0.1 07/29/2013 1455   BASOSABS 0.0 07/29/2013 1455

## 2017-09-08 NOTE — Care Management Note (Signed)
Case Management Note Donn PieriniKristi Ryman Rathgeber RN, BSN Unit 4E-Case Manager-- 2H coverage [phone]  Patient Details  Name: Steven BorerDonnie R Pavon Jr. MRN: [id number] Date of Birth: 1976-02-18  Subjective/Objective:  Pt admitted s/p MVR                  Action/Plan: PTA pt lived at home with wife- independent- anticipate return home- CM to follow for transition of care needs  Expected Discharge Date:                  Expected Discharge Plan:  Home/Self Care  In-House Referral:     Discharge planning Services  CM Consult  Post Acute Care Choice:    Choice offered to:     DME Arranged:    DME Agency:     HH Arranged:    HH Agency:     Status of Service:  In process, will continue to follow  If discussed at [place] Length of Stay Meetings, dates discussed:    Discharge Disposition:   Additional Comments:  Darrold SpanWebster, Mahrosh Donnell Hall, RN 09/08/2017, 10:20 AM

## 2017-09-08 NOTE — Plan of Care (Signed)
  Pain Managment: General experience of comfort will improve 09/08/2017 0218 - Progressing by Seleta RhymesAmo Kuffour, Alinda MoneyMelvin, RN   Skin Integrity: Risk for impaired skin integrity will decrease 09/08/2017 0218 - Progressing by Cecile SheererAmo Kuffour, Neave Lenger, RN   Respiratory: Respiratory status will improve 09/08/2017 0218 - Progressing by Seleta RhymesAmo Kuffour, Alinda MoneyMelvin, RN   Skin Integrity: Wound healing without signs and symptoms of infection 09/08/2017 0218 - Progressing by Cecile SheererAmo Kuffour, Grier Vu, RN   Urinary Elimination: Ability to achieve and maintain adequate renal perfusion and functioning will improve 09/08/2017 0218 - Progressing by Cecile SheererAmo Kuffour, Tykeria Wawrzyniak, RN

## 2017-09-08 NOTE — Progress Notes (Addendum)
[facility] DAILY ICU PROGRESS NOTE                   [address]            [place] [address]          [phone]   1 Day Post-Op Procedure(s) (LRB): MINIMALLY INVASIVE MITRAL VALVE REPAIR (MVR) using Sorin Memo 3D Ring size 34 (Right) TRANSESOPHAGEAL ECHOCARDIOGRAM (TEE) (N/A)  Total Length of Stay:  LOS: 1 day   Subjective:  Patient complains of a lot of pain.  Large amounts of pain medications used overnight.  Objective: Vital signs in last 24 hours: Temp:  [96.8 F (36 C)-100.6 F (38.1 C)] 99.9 F (37.7 C) (01/10 0700) Pulse Rate:  [81-92] 84 (01/10 0500) Cardiac Rhythm: Normal sinus rhythm (01/09 2100) Resp:  [10-33] 27 (01/10 0700) BP: (92-129)/(66-99) 108/85 (01/10 0700) SpO2:  [91 %-100 %] 100 % (01/10 0500) Arterial Line BP: (90-148)/(59-91) 123/70 (01/10 0700) FiO2 (%):  [40 %-60 %] 40 % (01/09 2004) Weight:  [153 lb 3.5 oz (69.5 kg)] 153 lb 3.5 oz (69.5 kg) (01/10 0540)  Filed Weights   09/07/17 0714 09/08/17 0540  Weight: 145 lb 14 oz (66.2 kg) 153 lb 3.5 oz (69.5 kg)    Weight change:    Hemodynamic parameters for last 24 hours: PAP: (16-37)/(8-19) 30/15 CO:  [3.6 L/min-5.6 L/min] 5.6 L/min CI:  [1.9 L/min/m2-3 L/min/m2] 3 L/min/m2  Intake/Output from previous day: 01/09 0701 - 01/10 0700 In: 5487 [P.O.:100; I.V.:4032; Blood:705; IV Piggyback:650] Out: 3070 [Urine:1670; Blood:900; Chest Tube:500]  Current Meds: Scheduled Meds: . acetaminophen  1,000 mg Oral Q6H   Or  . acetaminophen (TYLENOL) oral liquid 160 mg/5 mL  1,000 mg Per Tube Q6H  . aspirin EC  325 mg Oral Daily   Or  . aspirin  324 mg Per Tube Daily  . bisacodyl  10 mg Oral Daily   Or  . bisacodyl  10 mg Rectal Daily  . chlorhexidine gluconate (MEDLINE KIT)  15 mL Mouth Rinse BID  . Chlorhexidine Gluconate Cloth  6 each Topical Daily  . docusate sodium  200 mg Oral Daily  . [START ON 09/09/2017] enoxaparin (LOVENOX) injection  40 mg Subcutaneous QHS  . insulin  aspart  0-24 Units Subcutaneous Q4H  . insulin aspart  0-24 Units Subcutaneous Q4H  . mouth rinse  15 mL Mouth Rinse QID  . metoprolol tartrate  12.5 mg Oral BID   Or  . metoprolol tartrate  12.5 mg Per Tube BID  . pantoprazole  40 mg Oral Daily  . sodium chloride flush  10-40 mL Intracatheter Q12H  . sodium chloride flush  3 mL Intravenous Q12H  . warfarin  2.5 mg Oral q1800   Continuous Infusions: . sodium chloride 20 mL/hr at 09/07/17 2000  . sodium chloride    . sodium chloride 10 mL/hr at 09/08/17 0400  . albumin human    . cefUROXime (ZINACEF)  IV Stopped (09/07/17 2052)  . dexmedetomidine (PRECEDEX) IV infusion Stopped (09/08/17 8768)  . famotidine (PEPCID) IV Stopped (09/07/17 1549)  . lactated ringers    . lactated ringers 20 mL/hr at 09/07/17 2000  . nitroGLYCERIN Stopped (09/07/17 2250)  . phenylephrine (NEO-SYNEPHRINE) Adult infusion Stopped (09/07/17 1620)   PRN Meds:.sodium chloride, albumin human, ipratropium-albuterol, ketorolac, metoprolol tartrate, midazolam, morphine injection, ondansetron (ZOFRAN) IV, oxyCODONE, sodium chloride flush, sodium chloride flush, traMADol  General appearance: alert, cooperative and no distress Heart: regular rate and rhythm Lungs:  coarse throughout Abdomen: soft, non-tender; bowel sounds normal; no masses,  no organomegaly Extremities: edema none appreciated Wound: clean and dry  Lab Results: CBC: Recent Labs    09/07/17 2025 09/07/17 2035 09/08/17 0411  WBC 18.9*  --  13.6*  HGB 11.5* 11.6* 11.1*  HCT 34.8* 34.0* 33.5*  PLT 154  --  169   BMET:  Recent Labs    09/05/17 1111  09/07/17 2035 09/08/17 0411  NA 136   < > 141 138  K 3.7   < > 4.4 4.3  CL 108   < > 107 109  CO2 20*  --   --  25  GLUCOSE 103*   < > 129* 120*  BUN 13   < > 6 7  CREATININE 0.81   < > 0.60* 0.75  CALCIUM 9.4  --   --  8.0*   < > = values in this interval not displayed.    CMET: Lab Results  Component Value Date   WBC 13.6 (H)  09/08/2017   HGB 11.1 (L) 09/08/2017   HCT 33.5 (L) 09/08/2017   PLT 169 09/08/2017   GLUCOSE 120 (H) 09/08/2017   ALT 9 (L) 09/05/2017   AST 22 09/05/2017   NA 138 09/08/2017   K 4.3 09/08/2017   CL 109 09/08/2017   CREATININE 0.75 09/08/2017   BUN 7 09/08/2017   CO2 25 09/08/2017   TSH 0.632 06/06/2017   INR 1.22 09/07/2017   HGBA1C 5.0 09/05/2017      PT/INR:  Recent Labs    09/07/17 1445  LABPROT 15.3*  INR 1.22   Radiology: Dg Chest Port 1 View  Result Date: 09/07/2017 CLINICAL DATA:  Chest tube placement.  Atelectasis. EXAM: PORTABLE CHEST 1 VIEW COMPARISON:  09/05/2017 FINDINGS: Endotracheal tube tip is 5 cm above the carina. Left internal jugular Swan-Ganz catheter tip is in the main pulmonary artery. New mitral valve replacement is noted. Epicardial leads are in place. Right chest tube is in place. There is considerable collapse within the right lung with near complete collapse the right lower lobe. No visible pneumothorax. Mediastinal shift towards the right. IMPRESSION: Status post mitral valve replacement. Lines and tubes well positioned. Volume loss of the right lung with mediastinal shift towards the right. No visible pneumothorax. Electronically Signed   By: Nelson Chimes M.D.   On: 09/07/2017 15:03     Assessment/Plan: S/P Procedure(s) (LRB): MINIMALLY INVASIVE MITRAL VALVE REPAIR (MVR) using Sorin Memo 3D Ring size 34 (Right) TRANSESOPHAGEAL ECHOCARDIOGRAM (TEE) (N/A)  1. CV- NSR, BP controlled- start low dose Lopressor, coumadin for MV Repair 2. Pulm- no acute issues, CXR with some atelectasis/collapse of RLL.Marland Kitchen Need aggressive IS, CT 200 cc since midnight.. Leave in place 3. Renal- creatinine WNL, weight is elevated, will start Lasix today 4. Expected post operative blood loss anemia, mild Hgb at 11.0 5. CBGs controlled, patient off insulin drip, will cover with SSIP for now 6. Pain control- patient has low pain tolerance, high use of pain medication  overnight, will give some Toradol 15 mg Q6 prn pain 7. Dispo- patient stable POD #1, progression orders, start coumadin, Lasix, leave chest tubes in place     Erin Barrett 09/08/2017 7:50 AM    I have seen and examined the patient and agree with the assessment and plan as outlined.  Doing well POD1.  Patient has demonstrated an extraordinarily high tolerance for narcotic pain relievers and asks for pain medicine continuously.  Rexene Alberts, MD  09/08/2017 8:33 AM

## 2017-09-09 ENCOUNTER — Inpatient Hospital Stay (HOSPITAL_COMMUNITY): Payer: Medicaid Other

## 2017-09-09 LAB — CBC
HCT: 31.4 % — ABNORMAL LOW (ref 39.0–52.0)
HEMOGLOBIN: 10.3 g/dL — AB (ref 13.0–17.0)
MCH: 30.3 pg (ref 26.0–34.0)
MCHC: 32.8 g/dL (ref 30.0–36.0)
MCV: 92.4 fL (ref 78.0–100.0)
Platelets: 149 10*3/uL — ABNORMAL LOW (ref 150–400)
RBC: 3.4 MIL/uL — ABNORMAL LOW (ref 4.22–5.81)
RDW: 14.4 % (ref 11.5–15.5)
WBC: 14.8 10*3/uL — ABNORMAL HIGH (ref 4.0–10.5)

## 2017-09-09 LAB — GLUCOSE, CAPILLARY
GLUCOSE-CAPILLARY: 71 mg/dL (ref 65–99)
Glucose-Capillary: 105 mg/dL — ABNORMAL HIGH (ref 65–99)
Glucose-Capillary: 66 mg/dL (ref 65–99)
Glucose-Capillary: 90 mg/dL (ref 65–99)

## 2017-09-09 LAB — BASIC METABOLIC PANEL
ANION GAP: 5 (ref 5–15)
BUN: 10 mg/dL (ref 6–20)
CALCIUM: 8.2 mg/dL — AB (ref 8.9–10.3)
CO2: 26 mmol/L (ref 22–32)
CREATININE: 0.77 mg/dL (ref 0.61–1.24)
Chloride: 104 mmol/L (ref 101–111)
GFR calc Af Amer: >60 mL/min (ref >60)
GLUCOSE: 120 mg/dL — AB (ref 65–99)
Potassium: 3.8 mmol/L (ref 3.5–5.1)
Sodium: 135 mmol/L (ref 135–145)

## 2017-09-09 LAB — PROTIME-INR
INR: 1.1
PROTHROMBIN TIME: 14.1 s (ref 11.4–15.2)

## 2017-09-09 MED ORDER — FUROSEMIDE 40 MG PO TABS
40.0000 mg | ORAL_TABLET | Freq: Every day | ORAL | Status: DC
  Administered 2017-09-09 – 2017-09-12 (×3): 40 mg via ORAL
  Filled 2017-09-09 (×4): qty 1

## 2017-09-09 MED ORDER — MOVING RIGHT ALONG BOOK
Freq: Once | Status: DC
  Filled 2017-09-09: qty 1

## 2017-09-09 MED ORDER — DOCUSATE SODIUM 100 MG PO CAPS: 200.0000 mg | ORAL_CAPSULE | Freq: Two times a day (BID) | ORAL | Status: DC | PRN

## 2017-09-09 MED ORDER — BISACODYL 5 MG PO TBEC: 10.0000 mg | DELAYED_RELEASE_TABLET | Freq: Every day | ORAL | Status: DC | PRN

## 2017-09-09 MED ORDER — POTASSIUM CHLORIDE CRYS ER 20 MEQ PO TBCR
40.0000 meq | EXTENDED_RELEASE_TABLET | Freq: Once | ORAL | Status: AC
  Administered 2017-09-09: 40 meq via ORAL
  Filled 2017-09-09: qty 2

## 2017-09-09 MED ORDER — WARFARIN VIDEO: Freq: Once | Status: DC

## 2017-09-09 MED ORDER — BISACODYL 10 MG RE SUPP: 10.0000 mg | Freq: Every day | RECTAL | Status: DC | PRN

## 2017-09-09 MED ORDER — PATIENT'S GUIDE TO USING COUMADIN BOOK
Freq: Once | Status: DC
  Filled 2017-09-09: qty 1

## 2017-09-09 MED ORDER — ASPIRIN EC 81 MG PO TBEC
81.0000 mg | DELAYED_RELEASE_TABLET | Freq: Every day | ORAL | Status: DC
  Administered 2017-09-09 – 2017-09-12 (×4): 81 mg via ORAL
  Filled 2017-09-09 (×4): qty 1

## 2017-09-09 MED FILL — Potassium Chloride Inj 2 mEq/ML: INTRAVENOUS | Qty: 40 | Status: AC

## 2017-09-09 MED FILL — Magnesium Sulfate Inj 50%: INTRAMUSCULAR | Qty: 10 | Status: AC

## 2017-09-09 MED FILL — Heparin Sodium (Porcine) Inj 1000 Unit/ML: INTRAMUSCULAR | Qty: 30 | Status: AC

## 2017-09-09 MED FILL — Heparin Sodium (Porcine) Inj 1000 Unit/ML: INTRAMUSCULAR | Qty: 2500 | Status: AC

## 2017-09-09 NOTE — Discharge Instructions (Addendum)
Mitral Valve Repair, Care After This sheet gives you information about how to care for yourself after your procedure. Your health care provider may also give you more specific instructions. If you have problems or questions, contact your health care provider. What can I expect after the procedure? After the procedure, it is common to have:  Pain at the incision area that may last for several weeks.  Follow these instructions at home: Incision care  Follow instructions from your health care provider about how to take care of your incision. Make sure you: ? Wash your hands with soap and water before you change your bandage (dressing). If soap and water are not available, use hand sanitizer. ? Change your dressing as told by your health care provider. ? Leave stitches (sutures), skin glue, or adhesive strips in place. These skin closures may need to stay in place for 2 weeks or longer. If adhesive strip edges start to loosen and curl up, you may trim the loose edges. Do not remove adhesive strips completely unless your health care provider tells you to do that.  Check your incision area every day for signs of infection. Check for: ? More redness, swelling, or pain. ? More fluid or blood. ? Warmth. ? Pus or a bad smell.  Do not apply powder or lotion to the area. Driving  Do not drive until your health care provider approves.  Do not drive or use heavy machinery while taking prescription pain medicines. Bathing  Do not take baths, swim, or use a hot tub for 2-4 weeks after surgery, or until your health care provider approves. Ask your health care provider if you may take showers.  To wash the incision site, gently wash with soap and water and pat the area dry with a clean towel. Do not rub the incision area. That may cause bleeding. Activity  Rest as told by your health care provider. Ask your health care provider when you can resume normal activities, including sexual  activity.  Avoid the following activities for 6-8 weeks, or as [place] as directed: ? Lifting anything that is heavier than 10 lb (4.5 kg), or the limit that your health care provider tells you. ? Pushing or pulling things with your arms.  Avoid climbing stairs and using the handrail to pull yourself up for the first 2-3 weeks after surgery.  Avoid airplane travel for 4-6 weeks, or as [place] as directed.  Avoid sitting for [place] periods of time and crossing your legs. Get up and move around at least once every 1-2 hours.  If you are taking blood thinners (anticoagulants), avoid activities that have a high risk of injury. Ask your health care provider what activities are safe for you. Lifestyle  Limit alcohol intake to no more than 1 drink a day for nonpregnant women and 2 drinks a day for men. One drink equals 12 oz of beer, 5 oz of wine, or 1 oz of hard liquor.  Do not use any products that contain nicotine or tobacco, such as cigarettes and e-cigarettes. If you need help quitting, ask your health care provider. General instructions  Take your temperature every day and weigh yourself every morning for the first 7 days after surgery. Write your temperatures and weight down and take this record with you to any follow-up visits.  Take over-the-counter and prescription medicines only as told by your health care provider.  To prevent or treat constipation while you are taking prescription pain medicine, your health care provider  may recommend that you: ? Drink enough fluid to keep your urine clear or pale yellow. ? Take over-the-counter or prescription medicines. ? Eat foods that are high in fiber, such as fresh fruits and vegetables, whole grains, and beans. ? Limit foods that are high in fat and processed sugars, such as fried and sweet foods.  Follow instructions from your health care provider about eating or drinking restrictions.  Wear compression stockings for at least 2 weeks, or as  [place] as told by your health care provider. These stockings help to prevent blood clots and reduce swelling in your legs. If your ankles are swollen after 2 weeks, continue to wear the stockings.  Keep all follow-up visits as told by your health care provider. This is important. Contact a health care provider if:  You develop a skin rash.  Your weight is increasing each day over 2-3 days.  You gain 2 lb (1 kg) or more in a single day.  You have a fever. Get help right away if:  You develop chest pain that feels different from the pain caused by your incision.  You develop shortness of breath or difficulty breathing.  You have more redness, swelling, or pain around your incision.  You have more fluid or blood coming from your incision.  Your incision feels warm to the touch.  You have pus or a bad smell coming from your incision.  You feel light-headed. This information is not intended to replace advice given to you by your health care provider. Make sure you discuss any questions you have with your health care provider. Document Released: 03/05/2005 Document Revised: 05/28/2016 Document Reviewed: 05/28/2016 Elsevier Interactive Patient Education  2018 [employer].    Information on my medicine - Coumadin   (Warfarin)  This medication education was reviewed with me or my healthcare representative as part of my discharge preparation.  The pharmacist that spoke with me during my hospital stay was:  Clarnce Flock, [facility]  Why was Coumadin prescribed for you? Coumadin was prescribed for you because you have a blood clot or a medical condition that can cause an increased risk of forming blood clots. Blood clots can cause serious health problems by blocking the flow of blood to the heart, lung, or brain. Coumadin can prevent harmful blood clots from forming. As a reminder your indication for Coumadin is:   Blood Clot Prevention After Heart Valve Surgery  What test will check on  my response to Coumadin? While on Coumadin (warfarin) you will need to have an INR test regularly to ensure that your dose is keeping you in the desired range. The INR (international normalized ratio) number is calculated from the result of the laboratory test called prothrombin time (PT).  If an INR APPOINTMENT HAS NOT ALREADY BEEN MADE FOR YOU please schedule an appointment to have this lab work done by your health care provider within 7 days. Your INR goal is usually a number between:  2 to 3 or your provider may give you a more narrow range like 2-2.5.  Ask your health care provider during an office visit what your goal INR is.  What  do you need to  know  About  COUMADIN? Take Coumadin (warfarin) exactly as prescribed by your healthcare provider about the same time each day.  DO NOT stop taking without talking to the doctor who prescribed the medication.  Stopping without other blood clot prevention medication to take the place of Coumadin may increase your risk  of developing a new clot or stroke.  Get refills before you run out.  What do you do if you miss a dose? If you miss a dose, take it as soon as you remember on the same day then continue your regularly scheduled regimen the next day.  Do not take two doses of Coumadin at the same time.  Important Safety Information A possible side effect of Coumadin (Warfarin) is an increased risk of bleeding. You should call your healthcare provider right away if you experience any of the following: ? Bleeding from an injury or your nose that does not stop. ? Unusual colored urine (red or dark brown) or unusual colored stools (red or black). ? Unusual bruising for unknown reasons. ? A serious fall or if you hit your head (even if there is no bleeding).  Some foods or medicines interact with Coumadin (warfarin) and might alter your response to warfarin. To help avoid this: ? Eat a balanced diet, maintaining a consistent amount of Vitamin  K. ? Notify your provider about major diet changes you plan to make. ? Avoid alcohol or limit your intake to 1 drink for women and 2 drinks for men per day. (1 drink is 5 oz. wine, 12 oz. beer, or 1.5 oz. liquor.)  Make sure that ANY health care provider who prescribes medication for you knows that you are taking Coumadin (warfarin).  Also make sure the healthcare provider who is monitoring your Coumadin knows when you have started a new medication including herbals and non-prescription products.  Coumadin (Warfarin)  Major Drug Interactions  Increased Warfarin Effect Decreased Warfarin Effect  Alcohol (large quantities) Antibiotics (esp. Septra/Bactrim, Flagyl, Cipro) Amiodarone (Cordarone) Aspirin (ASA) Cimetidine (Tagamet) Megestrol (Megace) NSAIDs (ibuprofen, naproxen, etc.) Piroxicam (Feldene) Propafenone (Rythmol SR) Propranolol (Inderal) Isoniazid (INH) Posaconazole (Noxafil) Barbiturates (Phenobarbital) Carbamazepine (Tegretol) Chlordiazepoxide (Librium) Cholestyramine (Questran) Griseofulvin Oral Contraceptives Rifampin Sucralfate (Carafate) Vitamin K   Coumadin (Warfarin) Major Herbal Interactions  Increased Warfarin Effect Decreased Warfarin Effect  Garlic Ginseng Ginkgo biloba Coenzyme Q10 Green tea St. Johns wort    Coumadin (Warfarin) FOOD Interactions  Eat a consistent number of servings per week of foods HIGH in Vitamin K (1 serving =  cup)  Collards (cooked, or boiled & drained) Kale (cooked, or boiled & drained) Mustard greens (cooked, or boiled & drained) Parsley *serving size only =  cup Spinach (cooked, or boiled & drained) Swiss chard (cooked, or boiled & drained) Turnip greens (cooked, or boiled & drained)  Eat a consistent number of servings per week of foods MEDIUM-HIGH in Vitamin K (1 serving = 1 cup)  Asparagus (cooked, or boiled & drained) Broccoli (cooked, boiled & drained, or raw & chopped) Brussel sprouts (cooked, or boiled &  drained) *serving size only =  cup Lettuce, raw (green leaf, endive, romaine) Spinach, raw Turnip greens, raw & chopped   These websites have more information on Coumadin (warfarin):  http://www.king-russell.com/www.coumadin.com; https://www.hines.net/www.ahrq.gov/consumer/coumadin.htm;

## 2017-09-09 NOTE — Progress Notes (Addendum)
[facility] DAILY ICU PROGRESS NOTE                   [address]            [place] [address]          [phone]   2 Days Post-Op Procedure(s) (LRB): MINIMALLY INVASIVE MITRAL VALVE REPAIR (MVR) using Sorin Memo 3D Ring size 34 (Right) TRANSESOPHAGEAL ECHOCARDIOGRAM (TEE) (N/A)  Total Length of Stay:  LOS: 2 days   Subjective:  Continues to complain of pain.  Toradol provides relief.  Ambulated only 1 time since surgery.    Objective: Vital signs in last 24 hours: Temp:  [98 F (36.7 C)-99.5 F (37.5 C)] 98.9 F (37.2 C) (01/11 0344) Pulse Rate:  [42-148] 48 (01/11 0700) Cardiac Rhythm: Heart block (01/11 0400) Resp:  [15-38] 21 (01/11 0700) BP: (102-187)/(69-106) 133/71 (01/11 0700) SpO2:  [88 %-97 %] 89 % (01/11 0700) Arterial Line BP: (126)/(73) 126/73 (01/10 0800) Weight:  [144 lb 6.4 oz (65.5 kg)] 144 lb 6.4 oz (65.5 kg) (01/11 0641)  Filed Weights   09/07/17 0714 09/08/17 0540 09/09/17 0641  Weight: 145 lb 14 oz (66.2 kg) 153 lb 3.5 oz (69.5 kg) 144 lb 6.4 oz (65.5 kg)    Weight change: -7.6 oz (-0.668 kg)   Hemodynamic parameters for last 24 hours: PAP: (29)/(14) 29/14  Intake/Output from previous day: 01/10 0701 - 01/11 0700 In: 1020 [P.O.:480; I.V.:440; IV Piggyback:100] Out: 1975 [Urine:1575; Chest Tube:400]  Current Meds: Scheduled Meds: . acetaminophen  1,000 mg Oral Q6H  . aspirin EC  81 mg Oral Daily  . bisacodyl  10 mg Oral Daily   Or  . bisacodyl  10 mg Rectal Daily  . Chlorhexidine Gluconate Cloth  6 each Topical Daily  . docusate sodium  200 mg Oral Daily  . enoxaparin (LOVENOX) injection  40 mg Subcutaneous QHS  . furosemide  40 mg Oral Daily  . metoprolol tartrate  12.5 mg Oral BID  . pantoprazole  40 mg Oral Daily  . potassium chloride  20 mEq Oral Daily  . sodium chloride flush  10-40 mL Intracatheter Q12H  . sodium chloride flush  3 mL Intravenous Q12H  . warfarin  2.5 mg Oral q1800  . Warfarin - Physician Dosing  Inpatient   Does not apply q1800   Continuous Infusions: . sodium chloride    . cefUROXime (ZINACEF)  IV Stopped (09/08/17 2024)  . lactated ringers    . lactated ringers 20 mL/hr at 09/09/17 0400   PRN Meds:.ALPRAZolam, ipratropium-albuterol, ketorolac, metoprolol tartrate, ondansetron (ZOFRAN) IV, oxyCODONE, sodium chloride flush, sodium chloride flush, traMADol  General appearance: alert, cooperative and no distress Heart: regular rate and rhythm and brady Lungs: diminished breath sounds bibasilar Abdomen: soft, non-tender; bowel sounds normal; no masses,  no organomegaly Extremities: edema none appreciated Wound: clean and dry  Lab Results: CBC: Recent Labs    09/08/17 1627 09/08/17 1650 09/09/17 0259  WBC 16.1*  --  14.8*  HGB 11.0* 11.9* 10.3*  HCT 33.7* 35.0* 31.4*  PLT 161  --  149*   BMET:  Recent Labs    09/08/17 0411  09/08/17 1650 09/09/17 0259  NA 138  --  137 135  K 4.3  --  4.1 3.8  CL 109  --  101 104  CO2 25  --   --  26  GLUCOSE 120*  --  111* 120*  BUN 7  --  10 10  CREATININE 0.75   < > 0.70 0.77  CALCIUM 8.0*  --   --  8.2*   < > = values in this interval not displayed.    CMET: Lab Results  Component Value Date   WBC 14.8 (H) 09/09/2017   HGB 10.3 (L) 09/09/2017   HCT 31.4 (L) 09/09/2017   PLT 149 (L) 09/09/2017   GLUCOSE 120 (H) 09/09/2017   ALT 9 (L) 09/05/2017   AST 22 09/05/2017   NA 135 09/09/2017   K 3.8 09/09/2017   CL 104 09/09/2017   CREATININE 0.77 09/09/2017   BUN 10 09/09/2017   CO2 26 09/09/2017   TSH 0.632 06/06/2017   INR 1.10 09/09/2017   HGBA1C 5.0 09/05/2017      PT/INR:  Recent Labs    09/09/17 0259  LABPROT 14.1  INR 1.10   Radiology: Dg Chest Port 1 View  Result Date: 09/09/2017 CLINICAL DATA:  42 year old male status post mitral valve repair presents with right-sided pain and shortness of breath. EXAM: PORTABLE CHEST 1 VIEW COMPARISON:  Earlier chest radiograph dated 09/08/2017. FINDINGS: There  has been interval removal of the Swan-Ganz catheter. Right-sided chest tube in stable position as before. Right lung base density appear similar to prior radiograph and may represent atelectasis although infiltrate not excluded. Minimal left lung base atelectatic changes also noted. Trace pleural effusion may be present. There is no pneumothorax. Stable mild cardiomegaly. Mechanical cardiac valve. No acute osseous pathology. Mild soft tissue emphysema of the right lateral chest wall at the insertion of chest tube. IMPRESSION: 1. Interval removal of the Swan-Ganz catheter. 2. No significant interval change in the appearance of the lungs. Right lung base atelectasis similar to prior radiograph. Infiltrate not excluded. 3. Stable position of the right chest tube.  No pneumothorax. Electronically Signed   By: Elgie CollardArash  Radparvar M.D.   On: 09/09/2017 03:34     Assessment/Plan: S/P Procedure(s) (LRB): MINIMALLY INVASIVE MITRAL VALVE REPAIR (MVR) using Sorin Memo 3D Ring size 34 (Right) TRANSESOPHAGEAL ECHOCARDIOGRAM (TEE) (N/A)  1. CV- Sinus Huston FoleyBrady, rates in the 40s at times-- will d/c Lopressor, set on back up pacer, SBP in the 130s, likely due to pain.. Will watch if remains elevated can start low dose ACE, continue coumadin at 2.5 mg daily 2. Pulm- wean oxygen as tolerated, per nursing patient not using incentive, counseled patient extensively on importance of taking good deep breaths and using incentive several times per hour despite pain... CT output 200 cc since midnight, leave in place today 3. Renal- creatinine WNL. Weight trended down 8 lbs with IV lasix yesterday, will transition to oral Lasix today 4. Pain control- patient with desaturations at time, will d/c IV Morphine continue Toradol prn, encouraged patient to utilize his oral pain medications so we can get a regimen for when he is ready to go home 5. CBGs- controlled, hypoglycemic at times.. Will d/c SSIP and glucose checks 6. GI- night nurse  concerned patient isn't taking stool softners, ordered these prn per patient request as he has diverticulitis and has issues with diarrhea at times 7. Dispo- patient stable, sinus brady this morning rates in the 40s at times... Will d/c BB, will rehook to back up pacer, continue coumadin, diuretics, stop sliding scale.., possibly ready for transfer to step down today    Steven Dandyrin Barrett 09/09/2017 7:47 AM   I have seen and examined the patient and agree with the assessment and plan as outlined.  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  Stop  beta blocker and DDD pace for now.  Mobilize.  Diuresis.  Leave chest tubes in for now.  D/C Cordis sleeve.  Transfer step down.  Steven Nails, MD 09/09/2017 8:51 AM

## 2017-09-09 NOTE — Progress Notes (Signed)
Patient arrived from 2H to 4E room 4 after mini-valve repair with 2 right chest tubes in place. Telemetry monitor applied and CCMD notified.  Patient oriented to unit and room to include call light and phone.  Will continue to monitor.

## 2017-09-09 NOTE — Progress Notes (Signed)
[id number] Came to see pt to walk. He stated he could not walk at this time as he could not breathe well. No apparent SOB and sats at 95%RA. Hooked  pt to continuous sat monitoring. Pt stated he has walked twice already. Encouraged third walk today with staff. Pt just received pain med.  Will follow up tomorrow. Luetta NuttingCharlene Haruko Mersch RN BSN 09/09/2017 2:11 PM

## 2017-09-09 NOTE — Progress Notes (Signed)
CRITICAL VALUE ALERT  Critical Value:  CBG 66  Date & Time Notied:  N/A Hypoglycemia protocol followed  Provider Notified: N/A  Orders Received/Actions taken: 4 oz of orange juice administered and rechecked CBG 15 minutes= 71. Gave patient a orange Breeze and he drank about half. Will continue to monitor.

## 2017-09-09 NOTE — Discharge Summary (Signed)
Physician Discharge Summary  Patient ID: Steven Boreronnie R Espinal Jr. MRN: [id number] DOB/AGE: 02/25/1976 42 y.o.  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  He was originally seen in consultation on July 04, 2017.  He was last seen here in our office on August 01, 2017.  He reports no new problems or complaints over the past month.  He continues to experience exertional shortness of breath with moderate level activity.  He also describes intermittent substernal chest pressure associated with shortness of breath that seems to come and go and is not related to physical activity.  He he continues to smoke cigarettes although he states that he has cut back and is currently only smoking 3 or 4 cigarettes daily.  He denies any productive cough, fevers, hemoptysis, or wheezing.  The remainder of his review of systems is unremarkable.    Hospital Course:  On September 07, 2017 Mr. Kleckley underwent a minimally invasive mitral valve repair with Dr. Cornelius Moraswen.  He tolerated the procedure well and was transferred to the surgical ICU.  Postop day 0 he did remain intubated and sedated.  He was on 60% FiO2.  Postop day 1 he was extubated without issue.  We initiated low-dose Lopressor for better blood pressure and heart rate support.  He was put on Coumadin for mitral valve repair. His PT and INR wee monitored daily. As of 01/14, he is on 5 mg of Coumadin. His latest PT and INR are 1.25. We continued his  chest tubes due to high output.  We encouraged incentive spirometry for atelectasis and collapse of the right lower lobe.  His creatinine remained stable, however his weight was slightly elevated due to fluid overload.  We initiated a diuretic regimen at this time.  He did have some expected acute blood loss anemia which we trended.  We initiated sliding scale insulin for better blood glucose control.  We added Toradol for pain since the patient has a low pain tolerance.  Postop day 2 the patient continued to have incisional pain.  He did have some sinus bradycardia with rates in the 40s at times therefore Lopressor was discontinued.  He was set on a backup pacer.  We initiated Coumadin 2.5 mg daily for valve thrombus prophylaxis.  He continued to remain fluid overloaded but we were able to transition to oral Lasix today. He was informed he will NOT be discharged on a patch but an oral narcotic.   He had a lot of pain and despite being on Morphine IV PRN, Toradol PRN, and Oxycodone his pain was not well controlled. As discussed with Dr. Cornelius Moraswen, Morphine was stopped and he was put on a Fentanyl patch 25 mcg. Chest tube output decreased and CXR remained stable. Chest tubes were removed on 09/10/2017. His epicardial pacer was turned off on 01/12. 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 His conduction system did gradually improve. He was ambulating on 1 liter of oxygen and will likely be able to be weaned to room air. He is tolerating a diet and has had  a bowel movement. Epicardial pacing wires were removed on 09/11/2017. Chest tube sutures will be removed in the office after discharge. He is felt surgically stable for discharge today.    Consults: None   Significant Diagnostic Studies:   LINICAL DATA:  42 year old male status post mitral valve repair  presents with right-sided pain and shortness of breath.   EXAM:  PORTABLE CHEST 1 VIEW   COMPARISON:  Earlier chest radiograph dated  09/08/2017.   FINDINGS:  There has been interval removal of the Swan-Ganz catheter.  Right-sided chest tube in stable position as before. Right lung  base  density appear similar to prior radiograph and may represent  atelectasis although infiltrate not excluded. Minimal left lung base  atelectatic changes also noted. Trace pleural effusion may be  present. There is no pneumothorax. Stable mild cardiomegaly.  Mechanical cardiac valve. No acute osseous pathology. Mild soft  tissue emphysema of the right lateral chest wall at the insertion  of  chest tube.   IMPRESSION:  1. Interval removal of the Swan-Ganz catheter.  2. No significant interval change in the appearance of the lungs.  Right lung base atelectasis similar to prior radiograph. Infiltrate  not excluded.  3. Stable position of the right chest tube.  No pneumothorax.    Electronically Signed    By: Elgie Collard M.D.    On: 09/09/2017 03:34  Treatments:    CARDIOTHORACIC SURGERY OPERATIVE NOTE  Date of Procedure:                09/07/2017  Preoperative Diagnosis:      Severe Mitral Regurgitation  Postoperative Diagnosis:    Same  Procedure:        Minimally-Invasive Mitral Valve Repair             Complex valvuloplasty including triangular resection of posterior leaflet             Artificial Gore-tex neochord placement x6             Sorin Memo 3D Ring Annuloplasty (size 34 mm, catalog # [id number], serial # [id number])               Surgeon:        Salvatore Decent. Cornelius Moras, MD  Assistant:       Lowella Dandy, PA-C  Anesthesia:    Heather Roberts, MD  Operative Findings: ? 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 Severe prolapse involving middle scallop (P2) of posterior leaflet ? Type II dysfunction with severe mitral regurgitation ? Normal left ventricular systolic function ? No residual mitral regurgitation after successful valve repair    Discharge Exam: Blood pressure  119/84, pulse (!) 40, temperature 98.5 F (36.9 C), temperature source Oral, resp. rate (!) 25, height 6' (1.829 m), weight 142 lb 1.6 oz (64.5 kg), SpO2 (!) 82 %.   Cardiovascular: Paced Pulmonary: Diminished bibasilar breath sounds Abdomen: Soft, non tender, bowel sounds present. Extremities:No lower extremity edema. Wounds: Dressing is clean and dry.       Disposition: 01-Home or Self Care  Discharge Instructions    Amb Referral to Cardiac Rehabilitation   Complete by:  As directed    Diagnosis:  Valve Repair   Valve:  Mitral     Allergies as of 09/12/2017      Reactions   Latex Other (See Comments)   HX SUPERFICIAL THROMBOPHLEBITIS LEFT FOREARM AFTER IV IN APR 2011   Adhesive [tape] Rash      Medication  List    STOP taking these medications   methocarbamol 750 MG tablet Commonly known as:  ROBAXIN-750   naproxen sodium 220 MG tablet Commonly known as:  ALEVE   oxyCODONE-acetaminophen 5-325 MG tablet Commonly known as:  PERCOCET/ROXICET     TAKE these medications   acetaminophen 500 MG tablet Commonly known as:  TYLENOL Take 1,000-1,500 mg by mouth every 6 (six) hours as needed for mild pain or moderate pain (depends on pain if takes 2-3 tablets).   aspirin 81 MG EC tablet Take 1 tablet (81 mg total) by mouth daily.   diphenhydramine-acetaminophen 25-500 MG Tabs tablet Commonly known as:  TYLENOL PM Take 3 tablets by mouth at bedtime.   furosemide 40 MG tablet Commonly known as:  LASIX Take 1 tablet (40 mg total) by mouth daily.   Oxycodone HCl 10 MG Tabs Take 1 tablet (10 mg total) by mouth every 4 (four) hours as needed for severe pain.   potassium chloride SA 20 MEQ tablet Commonly known as:  K-DUR,KLOR-CON Take 1 tablet (20 mEq total) by mouth daily.   warfarin 5 MG tablet Commonly known as:  COUMADIN Take 1 tablet (5 mg total) by mouth daily.      Follow-up Information    Leone Brand, NP Follow up.   Specialties:  Cardiology,  Radiology Why:  Nada Boozer, NP 1/29 @8 :30 am [facility])  Contact information: [address] [place] [address] [phone]        [facility] [employer]. Schedule an appointment as soon as possible for a visit.   Specialty:  Cardiology Why:  to have PT and INR drawn (on Couamdin for mitral valve repair, INR goal 2-2.5) 48 hours from discharge from [facility]. Contact information: [address] [place] [address] [phone]       Purcell Nails, MD. Go on 10/10/2017.   Specialty:  Cardiothoracic Surgery Why:  PA/LAT CXR to be taken (at [facility] which is in the same building as Dr. Orvan July office) on 10/10/2017 at 10:30 am;Appointment time is at 11:00 am Contact information: [address] [place] [address] [phone]          The patient has been discharged on:   1.Beta Blocker:  Yes [   ]                              No   [ x  ]                              If No, reason: heart block   2.Ace Inhibitor/ARB: Yes [   ]                                     No  [  x  ]                                     If No, reason: labile BP  3.Statin:   Yes [   ]                  No  [ x  ]  If No, reason:No CAD  4.Marlowe Kays:  Yes  [ x  ]                  No   [   ]                  If No, reason:   Signed: Hipolito Martinezlopez  PA-C 09/12/2017, 2:31 PM

## 2017-09-10 ENCOUNTER — Other Ambulatory Visit: Payer: Self-pay

## 2017-09-10 LAB — PROTIME-INR
INR: 1.01
PROTHROMBIN TIME: 13.2 s (ref 11.4–15.2)

## 2017-09-10 LAB — BASIC METABOLIC PANEL
ANION GAP: 10 (ref 5–15)
BUN: 9 mg/dL (ref 6–20)
CALCIUM: 8.6 mg/dL — AB (ref 8.9–10.3)
CO2: 23 mmol/L (ref 22–32)
Chloride: 103 mmol/L (ref 101–111)
Creatinine, Ser: 0.81 mg/dL (ref 0.61–1.24)
GFR calc Af Amer: >60 mL/min (ref >60)
GLUCOSE: 169 mg/dL — AB (ref 65–99)
POTASSIUM: 4.1 mmol/L (ref 3.5–5.1)
Sodium: 136 mmol/L (ref 135–145)

## 2017-09-10 MED ORDER — LABETALOL HCL 5 MG/ML IV SOLN: 10.0000 mg | INTRAVENOUS | Status: DC | PRN

## 2017-09-10 MED ORDER — FENTANYL 25 MCG/HR TD PT72
25.0000 ug | MEDICATED_PATCH | TRANSDERMAL | Status: DC
  Administered 2017-09-10: 25 ug via TRANSDERMAL
  Filled 2017-09-10: qty 1

## 2017-09-10 MED ORDER — MIDAZOLAM HCL 2 MG/2ML IJ SOLN
2.0000 mg | Freq: Once | INTRAMUSCULAR | Status: AC
  Administered 2017-09-10: 2 mg via INTRAVENOUS
  Filled 2017-09-10: qty 2

## 2017-09-10 MED ORDER — WARFARIN SODIUM 5 MG PO TABS
5.0000 mg | ORAL_TABLET | Freq: Once | ORAL | Status: AC
  Administered 2017-09-10: 5 mg via ORAL
  Filled 2017-09-10: qty 1

## 2017-09-10 MED ORDER — MORPHINE SULFATE (PF) 2 MG/ML IV SOLN
2.0000 mg | INTRAVENOUS | Status: DC | PRN
  Administered 2017-09-10: 2 mg via INTRAVENOUS
  Filled 2017-09-10: qty 1

## 2017-09-10 MED ORDER — NITROGLYCERIN 0.4 MG SL SUBL
SUBLINGUAL_TABLET | SUBLINGUAL | Status: AC
  Filled 2017-09-10: qty 1

## 2017-09-10 NOTE — Progress Notes (Signed)
Cardiac Rehab 1205 Came to try to ambulate pt. His nurse states that he is in a lot of pain and she has had to give him IV pain medication.She states that now is not a good time for us to try to walk him. He is having his chest tube removed today and his nurse states that she will try to get him to walk after that. We will continue to follow him.

## 2017-09-10 NOTE — Progress Notes (Signed)
2 chest tubes and OnQ pump removed. 2mg  versed given before procedure per order. Pt tolerated fair. States he feels his breathing is improved after removal. EPW rolled and taped. Pacer box was disconnect with Donielle PA at bedside. Telemetry updated.   Leonidas Rombergaitlin S Bumbledare, RN

## 2017-09-10 NOTE — Progress Notes (Signed)
Patient c/o of CP; 12 lead EKG completed & charted; Nitro PO given; Notified Physician; Orders received.

## 2017-09-10 NOTE — Plan of Care (Signed)
  Progressing Education: Knowledge of General Education information will improve 09/10/2017 0131 - Progressing by Ruel FavorsBrown-Staples, Taia Bramlett, RN Health Behavior/Discharge Planning: Ability to manage health-related needs will improve 09/10/2017 0131 - Progressing by Ruel FavorsBrown-Staples, Frona Yost, RN Clinical Measurements: Ability to maintain clinical measurements within normal limits will improve 09/10/2017 0131 - Progressing by Ruel FavorsBrown-Staples, Lillyrose Reitan, RN Will remain free from infection 09/10/2017 0131 - Progressing by Ruel FavorsBrown-Staples, Shana Younge, RN Diagnostic test results will improve 09/10/2017 0131 - Progressing by Ruel FavorsBrown-Staples, Coleby Yett, RN Respiratory complications will improve 09/10/2017 0131 - Progressing by Ruel FavorsBrown-Staples, Hamdan Toscano, RN Cardiovascular complication will be avoided 09/10/2017 0131 - Progressing by Ruel FavorsBrown-Staples, Ariatna Jester, RN Activity: Risk for activity intolerance will decrease 09/10/2017 0131 - Progressing by Ruel FavorsBrown-Staples, Saverio Kader, RN Nutrition: Adequate nutrition will be maintained 09/10/2017 0131 - Progressing by Ruel FavorsBrown-Staples, Paydon Carll, RN Coping: Level of anxiety will decrease 09/10/2017 0131 - Progressing by Ruel FavorsBrown-Staples, Navia Lindahl, RN Elimination: Will not experience complications related to bowel motility 09/10/2017 0131 - Progressing by Ruel FavorsBrown-Staples, Ryatt Corsino, RN Will not experience complications related to urinary retention 09/10/2017 0131 - Progressing by Ruel FavorsBrown-Staples, Terrence Pizana, RN Pain Managment: General experience of comfort will improve 09/10/2017 0131 - Progressing by Ruel FavorsBrown-Staples, Dymir Neeson, RN Safety: Ability to remain free from injury will improve 09/10/2017 0131 - Progressing by Ruel FavorsBrown-Staples, Tamea Bai, RN Skin Integrity: Risk for impaired skin integrity will decrease 09/10/2017 0131 - Progressing by Ruel FavorsBrown-Staples, Jakaden Ouzts, RN Education: Ability to demonstrate proper wound care will improve 09/10/2017 0131 - Progressing by Ruel FavorsBrown-Staples, Reise Gladney, RN Knowledge of disease  or condition will improve 09/10/2017 0131 - Progressing by Ruel FavorsBrown-Staples, Panagiotis Oelkers, RN Knowledge of the prescribed therapeutic regimen will improve 09/10/2017 0131 - Progressing by Ruel FavorsBrown-Staples, Monti Jilek, RN Activity: Risk for activity intolerance will decrease 09/10/2017 0131 - Progressing by Ruel FavorsBrown-Staples, Magnolia Mattila, RN Cardiac: Hemodynamic stability will improve 09/10/2017 0131 - Progressing by Ruel FavorsBrown-Staples, Herta Hink, RN Clinical Measurements: Postoperative complications will be avoided or minimized 09/10/2017 0131 - Progressing by Ruel FavorsBrown-Staples, Enijah Furr, RN Respiratory: Respiratory status will improve 09/10/2017 0131 - Progressing by Ruel FavorsBrown-Staples, Dayn Barich, RN Skin Integrity: Wound healing without signs and symptoms of infection 09/10/2017 0131 - Progressing by Ruel FavorsBrown-Staples, Wali Reinheimer, RN Risk for impaired skin integrity will decrease 09/10/2017 0131 - Progressing by Ruel FavorsBrown-Staples, Dream Harman, RN Urinary Elimination: Ability to achieve and maintain adequate renal perfusion and functioning will improve 09/10/2017 0131 - Progressing by Ruel FavorsBrown-Staples, Alasha Mcguinness, RN

## 2017-09-10 NOTE — Progress Notes (Signed)
Patient refused Lab Draw.

## 2017-09-10 NOTE — Plan of Care (Signed)
  Progressing Health Behavior/Discharge Planning: Ability to manage health-related needs will improve 09/10/2017 2016 - Progressing by Ruel FavorsBrown-Staples, Vernestine Brodhead, RN Clinical Measurements: Ability to maintain clinical measurements within normal limits will improve 09/10/2017 2016 - Progressing by Ruel FavorsBrown-Staples, Wil Slape, RN Diagnostic test results will improve 09/10/2017 2016 - Progressing by Ruel FavorsBrown-Staples, Jeron Grahn, RN Respiratory complications will improve 09/10/2017 2016 - Progressing by Ruel FavorsBrown-Staples, Kiandria Clum, RN Cardiovascular complication will be avoided 09/10/2017 2016 - Progressing by Ruel FavorsBrown-Staples, Jilliann Subramanian, RN Activity: Risk for activity intolerance will decrease 09/10/2017 2016 - Progressing by Ruel FavorsBrown-Staples, Jazarah Capili, RN Coping: Level of anxiety will decrease 09/10/2017 2016 - Progressing by Ruel FavorsBrown-Staples, Stephanine Reas, RN Elimination: Will not experience complications related to bowel motility 09/10/2017 2016 - Progressing by Ruel FavorsBrown-Staples, Savan Ruta, RN Will not experience complications related to urinary retention 09/10/2017 2016 - Progressing by Ruel FavorsBrown-Staples, Sabir Charters, RN Pain Managment: General experience of comfort will improve 09/10/2017 2016 - Progressing by Ruel FavorsBrown-Staples, Breauna Mazzeo, RN Safety: Ability to remain free from injury will improve 09/10/2017 2016 - Progressing by Ruel FavorsBrown-Staples, Prabhjot Piscitello, RN Education: Ability to demonstrate proper wound care will improve 09/10/2017 2016 - Progressing by Ruel FavorsBrown-Staples, Kendelle Schweers, RN Knowledge of the prescribed therapeutic regimen will improve 09/10/2017 2016 - Progressing by Ruel FavorsBrown-Staples, Verley Pariseau, RN Activity: Risk for activity intolerance will decrease 09/10/2017 2016 - Progressing by Ruel FavorsBrown-Staples, Crescentia Boutwell, RN Skin Integrity: Wound healing without signs and symptoms of infection 09/10/2017 2016 - Progressing by Ruel FavorsBrown-Staples, Dorothye Berni, RN Risk for impaired skin integrity will decrease 09/10/2017 2016 - Progressing by Ruel FavorsBrown-Staples, Sharri Loya, RN

## 2017-09-10 NOTE — Progress Notes (Addendum)
[address]       [place] [id number]             [phone]        3 Days Post-Op Procedure(s) (LRB): MINIMALLY INVASIVE MITRAL VALVE REPAIR (MVR) using Sorin Memo 3D Ring size 34 (Right) TRANSESOPHAGEAL ECHOCARDIOGRAM (TEE) (N/A)  Subjective: Patient states he had chest pain (mid sternum) earlier this am and was given Nitro. He states he did not sleep last night because was in a lot of pain.  Objective: Vital signs in last 24 hours: Temp:  [98.1 F (36.7 C)-98.7 F (37.1 C)] 98.7 F (37.1 C) (01/12 0408) Pulse Rate:  [47-106] 92 (01/12 0408) Cardiac Rhythm: Heart block (01/12 0400) Resp:  [18-30] 28 (01/12 0408) BP: (110-162)/(71-115) 150/102 (01/12 0503) SpO2:  [90 %-100 %] 93 % (01/12 0408) Weight:  [149 lb 11.1 oz (67.9 kg)] 149 lb 11.1 oz (67.9 kg) (01/12 0408)  Pre op weight 66.2 kg Current Weight  09/10/17 149 lb 11.1 oz (67.9 kg)      Intake/Output from previous day: 01/11 0701 - 01/12 0700 In: -  Out: 30 [Chest Tube:30]   Physical Exam:  Cardiovascular: Paced Pulmonary: Diminished bibasilar breath sounds Abdomen: Soft, non tender, bowel sounds present. Extremities:No lower extremity edema. Wounds: Dressing is clean and dry.    Lab Results: CBC: Recent Labs    09/08/17 1627 09/08/17 1650 09/09/17 0259  WBC 16.1*  --  14.8*  HGB 11.0* 11.9* 10.3*  HCT 33.7* 35.0* 31.4*  PLT 161  --  149*   BMET:  Recent Labs    09/08/17 0411  09/08/17 1650 09/09/17 0259  NA 138  --  137 135  K 4.3  --  4.1 3.8  CL 109  --  101 104  CO2 25  --   --  26  GLUCOSE 120*  --  111* 120*  BUN 7  --  10 10  CREATININE 0.75   < > 0.70 0.77  CALCIUM 8.0*  --   --  8.2*   < > = values in this interval not displayed.    PT/INR:  Lab Results  Component Value Date   INR 1.10 09/09/2017   INR 1.22 09/07/2017   INR 0.93 09/05/2017   ABG:  INR: Will add last result for INR, ABG once components are confirmed Will add last 4 CBG  results once components are confirmed  Assessment/Plan:  1. CV - Paced 86. 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 External pacer turned off around 12:10 pm and underlying rate in the 60's. Hypertensive, some of which is related to pain. On Coumadin. INR not drawn yet as patient refused. Await INR as now agreeable to dose Coumadin. 2.  Pulmonary - Chest tube output 30 cc last 24 hours. Will remove chest tubes as discussed with Dr. Cornelius Moraswen. On 2 liters of oxygen via . Wean as tolerates. Encourage incentive spirometer. 3. Volume Overload - On Lasix 40 mg daily 4.  Acute blood loss anemia - H and H yesterday 10.3 and 31.4 5. Regarding pain control, has Morphine ordered every 1 hours PRN breakththrough pain, Toradol 15 mg IV ever 6 hours PRN, and Oxy oral every 3 hours PRN. Will discuss with Dr. Cornelius Moraswen. Also, I did explain the importance of daily blood draws to check his INR and that he needs to allow us to do this. As discussed with Dr. Cornelius Moraswen, will try Fentanyl patch as [place] as he agrees  to take medications and blood draws.  Donielle M ZimmermanPA-C 09/10/2017,7:08 AM   I have seen and examined the patient and agree with the assessment and plan as outlined.  Continued anxiety and issues with pain management, requesting narcotics continuously.  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  Stop pacing and monitor.  D/C chest tubes.  Purcell Nails, MD 09/10/2017 1:22 PM

## 2017-09-11 ENCOUNTER — Inpatient Hospital Stay (HOSPITAL_COMMUNITY): Payer: Medicaid Other

## 2017-09-11 ENCOUNTER — Other Ambulatory Visit: Payer: Self-pay

## 2017-09-11 LAB — PROTIME-INR
INR: 1.13
PROTHROMBIN TIME: 14.4 s (ref 11.4–15.2)

## 2017-09-11 MED ORDER — WARFARIN SODIUM 5 MG PO TABS
5.0000 mg | ORAL_TABLET | Freq: Once | ORAL | Status: AC
  Administered 2017-09-11: 5 mg via ORAL
  Filled 2017-09-11: qty 1

## 2017-09-11 NOTE — Progress Notes (Signed)
EPW pulled per protocol at 1335. Pt tolerated fair. VSS. Requested Toradol after the procedure. Pt verbalized understanding of one hour bed rest. Wife at bedside.   Leonidas Rombergaitlin S Bumbledare, RN

## 2017-09-11 NOTE — Progress Notes (Addendum)
[address]       [place] [address]             [phone]        4 Days Post-Op Procedure(s) (LRB): MINIMALLY INVASIVE MITRAL VALVE REPAIR (MVR) using Sorin Memo 3D Ring size 34 (Right) TRANSESOPHAGEAL ECHOCARDIOGRAM (TEE) (N/A)  Subjective: Patient states he feels much better this am. He still is sore, but patch has helped take "edge" off severe pain he had previously.  Objective: Vital signs in last 24 hours: Temp:  [98.5 F (36.9 C)-100.1 F (37.8 C)] 98.6 F (37 C) (01/13 0315) Pulse Rate:  [60-95] 93 (01/13 0315) Cardiac Rhythm: Ventricular paced;Bundle branch block;Normal sinus rhythm (01/13 0400) Resp:  [18-33] 18 (01/13 0315) BP: (113-153)/(74-96) 113/74 (01/13 0315) SpO2:  [91 %-96 %] 92 % (01/13 0315) Weight:  [143 lb 9.6 oz (65.1 kg)] 143 lb 9.6 oz (65.1 kg) (01/13 0331)  Pre op weight 66.2 kg Current Weight  09/11/17 143 lb 9.6 oz (65.1 kg)      Intake/Output from previous day: 01/12 0701 - 01/13 0700 In: 600 [P.O.:600] Out: 900 [Urine:900]   Physical Exam:  Cardiovascular: Paced Pulmonary: Diminished bibasilar breath sounds Abdomen: Soft, non tender, bowel sounds present. Extremities:No lower extremity edema. Wounds: Dressing is clean and dry.    Lab Results: CBC: Recent Labs    09/08/17 1627 09/08/17 1650 09/09/17 0259  WBC 16.1*  --  14.8*  HGB 11.0* 11.9* 10.3*  HCT 33.7* 35.0* 31.4*  PLT 161  --  149*   BMET:  Recent Labs    09/09/17 0259 09/10/17 1220  NA 135 136  K 3.8 4.1  CL 104 103  CO2 26 23  GLUCOSE 120* 169*  BUN 10 9  CREATININE 0.77 0.81  CALCIUM 8.2* 8.6*    PT/INR:  Lab Results  Component Value Date   INR 1.13 09/11/2017   INR 1.01 09/10/2017   INR 1.10 09/09/2017   ABG:  INR: Will add last result for INR, ABG once components are confirmed Will add last 4 CBG results once components are confirmed  Assessment/Plan:  1. CV - Previous 2nd degree then CHB. HR in the 90's this  am. . On Coumadin. INR slightly increased from 1.01 to 1.13 with Coumadin 2.5 mg. He was given 5 mg last night and will give this dose again tonight to help increase INR. 2.  Pulmonary - Chest tubes removed yesterday. On 1 liter of oxygen via [place]. Continue to wean as able. CXR this am ordered but not taken yet. Encourage incentive spirometer. 3. Volume Overload - On Lasix 40 mg daily 4.  Acute blood loss anemia - H and H yesterday 10.3 and 31.4 5. Regarding pain control, has Morphine ordered every 1 hours PRN breakththrough pain, Toradol 15 mg IV ever 6 hours PRN, and Oxy oral every 3 hours PRN. Will discuss with Dr. Cornelius Moras. Also, I did explain the importance of daily blood draws to check his INR and that he needs to allow [place] to do this. As discussed with Dr. Cornelius Moras, will try Fentanyl patch as [place] as he agrees to take medications and blood draws. He will NOT be discharged with a Fentanyl patch  Steven Montoya 09/11/2017,7:13 AM    I have seen and examined the patient and agree with the assessment and plan as outlined.  Now back in NSR w/ [place]arez 1st degree AV block.  Otherwise doing well.  Anticipate possible d/c home  tomorrow  Steven Nailslarence H Owen, Steven Montoya 09/11/2017 10:59 AM

## 2017-09-11 NOTE — Progress Notes (Signed)
Spoke with patient regarding EPW removal. Pt expressed concerns of anticipated pain of the procedure. Pt given PRN oxycodone at 11:30. Will give medication time before pulling EPW. Emphasized to patient that wires needed to be pulled before 1pm.   Leonidas Rombergaitlin S Bumbledare, RN

## 2017-09-12 LAB — PROTIME-INR
INR: 1.25
PROTHROMBIN TIME: 15.5 s — AB (ref 11.4–15.2)

## 2017-09-12 MED ORDER — ASPIRIN 81 MG PO TBEC: 81.0000 mg | DELAYED_RELEASE_TABLET | Freq: Every day | ORAL | Status: DC

## 2017-09-12 MED ORDER — OXYCODONE HCL 10 MG PO TABS: 10.0000 mg | ORAL_TABLET | ORAL | 0 refills | Status: DC | PRN

## 2017-09-12 MED ORDER — POTASSIUM CHLORIDE CRYS ER 20 MEQ PO TBCR: 20.0000 meq | EXTENDED_RELEASE_TABLET | Freq: Every day | ORAL | 0 refills | Status: DC

## 2017-09-12 MED ORDER — WARFARIN SODIUM 5 MG PO TABS: 5.0000 mg | ORAL_TABLET | Freq: Every day | ORAL | 11 refills | Status: DC

## 2017-09-12 MED ORDER — FUROSEMIDE 40 MG PO TABS: 40.0000 mg | ORAL_TABLET | Freq: Every day | ORAL | 0 refills | Status: DC

## 2017-09-12 NOTE — Progress Notes (Addendum)
      [address]       [place], [id number]             [phone]      5 Days Post-Op Procedure(s) (LRB): MINIMALLY INVASIVE MITRAL VALVE REPAIR (MVR) using Sorin Memo 3D Ring size 34 (Right) TRANSESOPHAGEAL ECHOCARDIOGRAM (TEE) (N/A)   Subjective:  No new complaints.  Continues to complain of pain.  Per nursing refusing blood draw, until he can receive pain medication.  Explained to patient needs to have labs drawn this morning to check PT/INR, prior to discharge.  + ambulation   + BM  Objective: Vital signs in last 24 hours: Temp:  [98 F (36.7 C)-99.4 F (37.4 C)] 98.6 F (37 C) (01/14 0446) Pulse Rate:  [66-100] 66 (01/14 0446) Cardiac Rhythm: Junctional rhythm (01/14 0446) Resp:  [17-24] 20 (01/14 0446) BP: (88-143)/(56-96) 143/94 (01/14 0446) SpO2:  [93 %-100 %] 100 % (01/14 0446) Weight:  [142 lb 1.6 oz (64.5 kg)] 142 lb 1.6 oz (64.5 kg) (01/14 0446)  Intake/Output from previous day: 01/13 0701 - 01/14 0700 In: 840 [P.O.:840] Out: 600 [Urine:600]  General appearance: alert, cooperative and no distress Heart: regular rate and rhythm Lungs: diminished breath sounds bibasilar Abdomen: soft, non-tender; bowel sounds normal; no masses,  no organomegaly Extremities: edema none appreciate Wound: clean and dry  Lab Results: No results for input(s): WBC, HGB, HCT, PLT in the last 72 hours. BMET:  Recent Labs    09/10/17 1220  NA 136  K 4.1  CL 103  CO2 23  GLUCOSE 169*  BUN 9  CREATININE 0.81  CALCIUM 8.6*    PT/INR:  Recent Labs    09/11/17 0310  LABPROT 14.4  INR 1.13   ABG    Component Value Date/Time   PHART 7.376 09/07/2017 2200   HCO3 24.8 09/07/2017 2200   TCO2 25 09/08/2017 1650   ACIDBASEDEF 4.0 (H) 09/07/2017 2031   O2SAT 95.0 09/07/2017 2200   CBG (last 3)  No results for input(s): GLUCAP in the last 72 hours.  Assessment/Plan: S/P Procedure(s) (LRB): MINIMALLY INVASIVE MITRAL VALVE REPAIR (MVR) using Sorin Memo  3D Ring size 34 (Right) TRANSESOPHAGEAL ECHOCARDIOGRAM (TEE) (N/A)  1. CV- NSR with 1st degree AV block, EPW wires out yesterday 2. Pulm - no breathing issues, CXR shows minimal right side pneumothorax post chest tube removal, also with minor sub q air present 3. INR- patient refusing blood draw this morning, instructed patient need that lab value before he can be discharged home 4. Renal- creatinine WNL, weight is stable, continue lasix 5. Pain control- patient instructed he will not be given fentanyl patch at discharge 6. Dispo- patient stable, likely home today after PT/INR is obtained so we can ensure proper coumadin dosing   LOS: 5 days    Steven Dandyrin Barrett 09/12/2017  I have seen and examined the patient and agree with the assessment and plan as outlined.  Tentatively plan home today once INR results back.  Steven Nailslarence H Owen, MD 09/12/2017 8:26 AM

## 2017-09-12 NOTE — Care Management Note (Signed)
Case Management Note Donn PieriniKristi Xiara Knisley RN, BSN Unit 4E-Case Manager-- 2H coverage [phone]  Patient Details  Name: Steven BorerDonnie R Schetter Jr. MRN: [id number] Date of Birth: June 23, 1976  Subjective/Objective:  Pt admitted s/p MVR                  Action/Plan: PTA pt lived at home with wife- independent- anticipate return home- CM to follow for transition of care needs  Expected Discharge Date:  09/12/17               Expected Discharge Plan:  Home/Self Care  In-House Referral:  NA  Discharge planning Services  CM Consult  Post Acute Care Choice:  NA Choice offered to:  NA  DME Arranged:    DME Agency:     HH Arranged:    HH Agency:     Status of Service:  Completed, signed off  If discussed at [employer] Length of Stay Meetings, dates discussed:    Discharge Disposition: home/self care   Additional Comments:  09/12/17- 1200- Tonni Mansour RN, CM- pt for d/c home today- no CM needs noted for transition to home.   Darrold SpanWebster, [place]i Marsico Hall, RN 09/12/2017, 2:08 PM

## 2017-09-12 NOTE — Progress Notes (Signed)
Discussed restrictions, IS, mobility, tobacco cessation (pt dips, has been dipping in the room), and [facility]II with pt and wife. Voiced understanding and requests his referral be sent to [place] [facility].  [phone] Ethelda ChickKristan Kanylah Muench CES, ACSM 10:19 AM 09/12/2017

## 2017-09-13 ENCOUNTER — Telehealth: Payer: Self-pay

## 2017-09-13 NOTE — Telephone Encounter (Signed)
Mr. Steven Montoya called requesting xanax for anxiety to help him sleep.  Call was returned back to him and explained that he needed to try over the counter melatonin up to 10mg  for sleep or Benadryl up to 25 mg to also help with sleep.  Told to not mix the two medications, but could try each to see which worked best.  Also, advised to call if any questions arise.

## 2017-09-21 ENCOUNTER — Telehealth: Payer: Self-pay

## 2017-09-21 DIAGNOSIS — G8918 Other acute postprocedural pain: Secondary | ICD-10-CM

## 2017-09-21 MED ORDER — TRAMADOL HCL 50 MG PO TABS: 50.0000 mg | ORAL_TABLET | Freq: Four times a day (QID) | ORAL | 0 refills | Status: DC | PRN

## 2017-09-21 NOTE — Telephone Encounter (Signed)
RX for Tramadol 50 mg po every 6 hours prn/pain faxed to [facility]. Patient is aware

## 2017-09-27 ENCOUNTER — Ambulatory Visit: Payer: Self-pay | Admitting: Cardiology

## 2017-10-10 ENCOUNTER — Ambulatory Visit: Payer: Self-pay | Admitting: Thoracic Surgery (Cardiothoracic Vascular Surgery)

## 2017-10-10 IMAGING — DX DG KNEE COMPLETE 4+V*R*
4 series · 4 of 4 positions shown · non-contrast
Comparison: None.

CLINICAL DATA: Pain after trauma

EXAM:
RIGHT KNEE - COMPLETE 4+ VIEW

[knee ap]
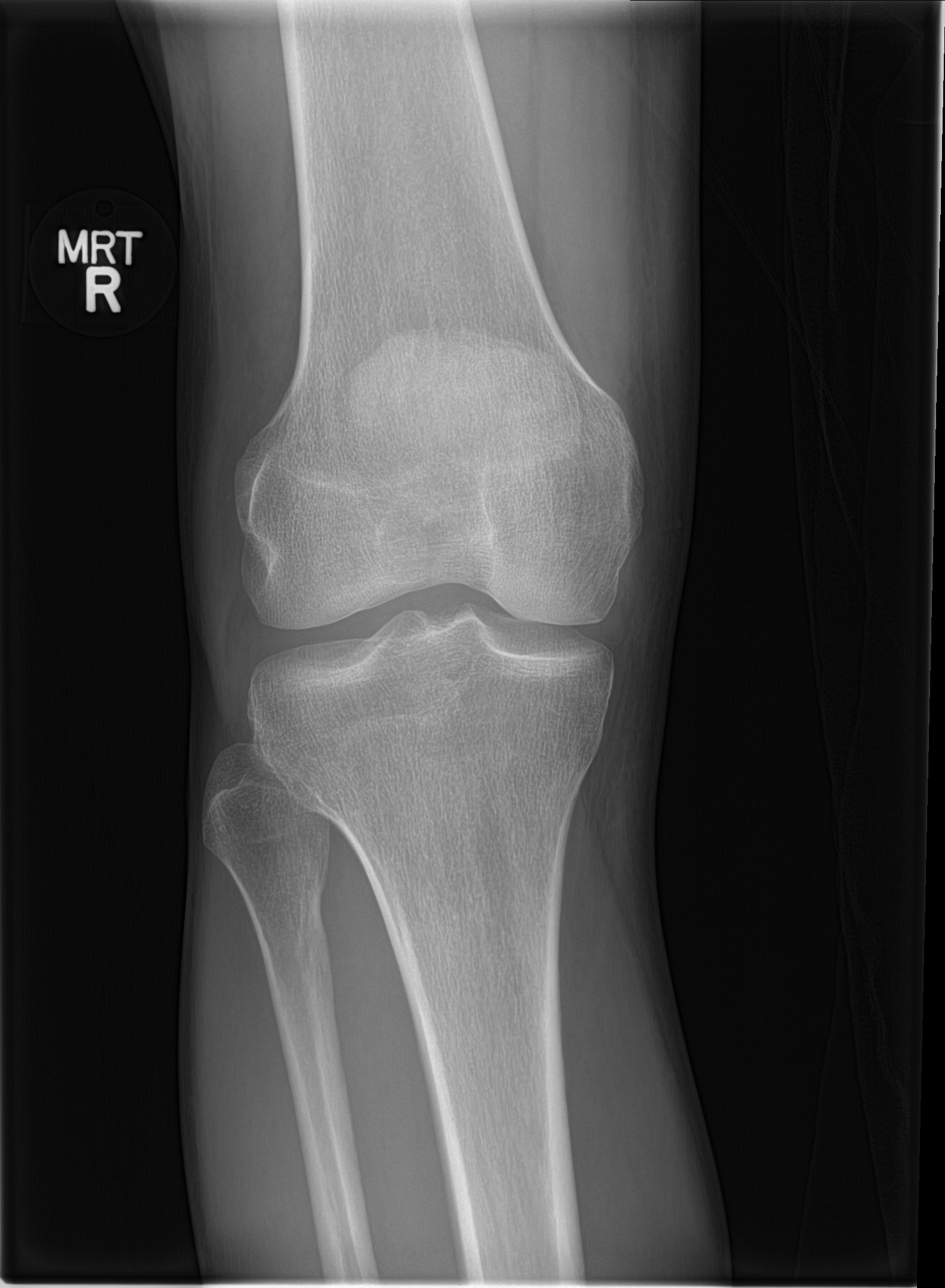

[knee lat]
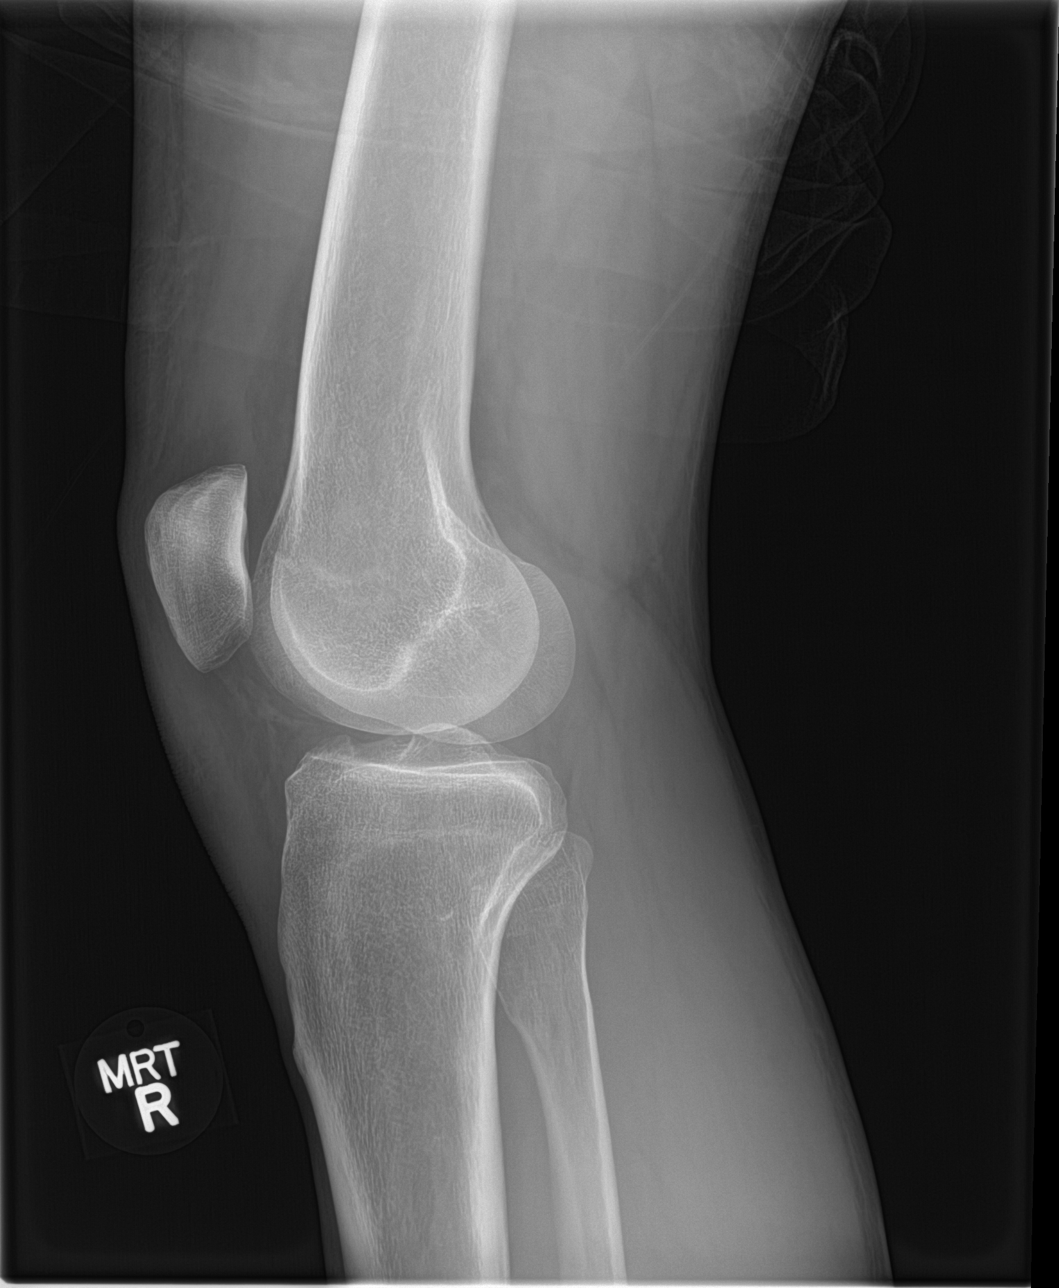

[knee obl (1 of 2)]
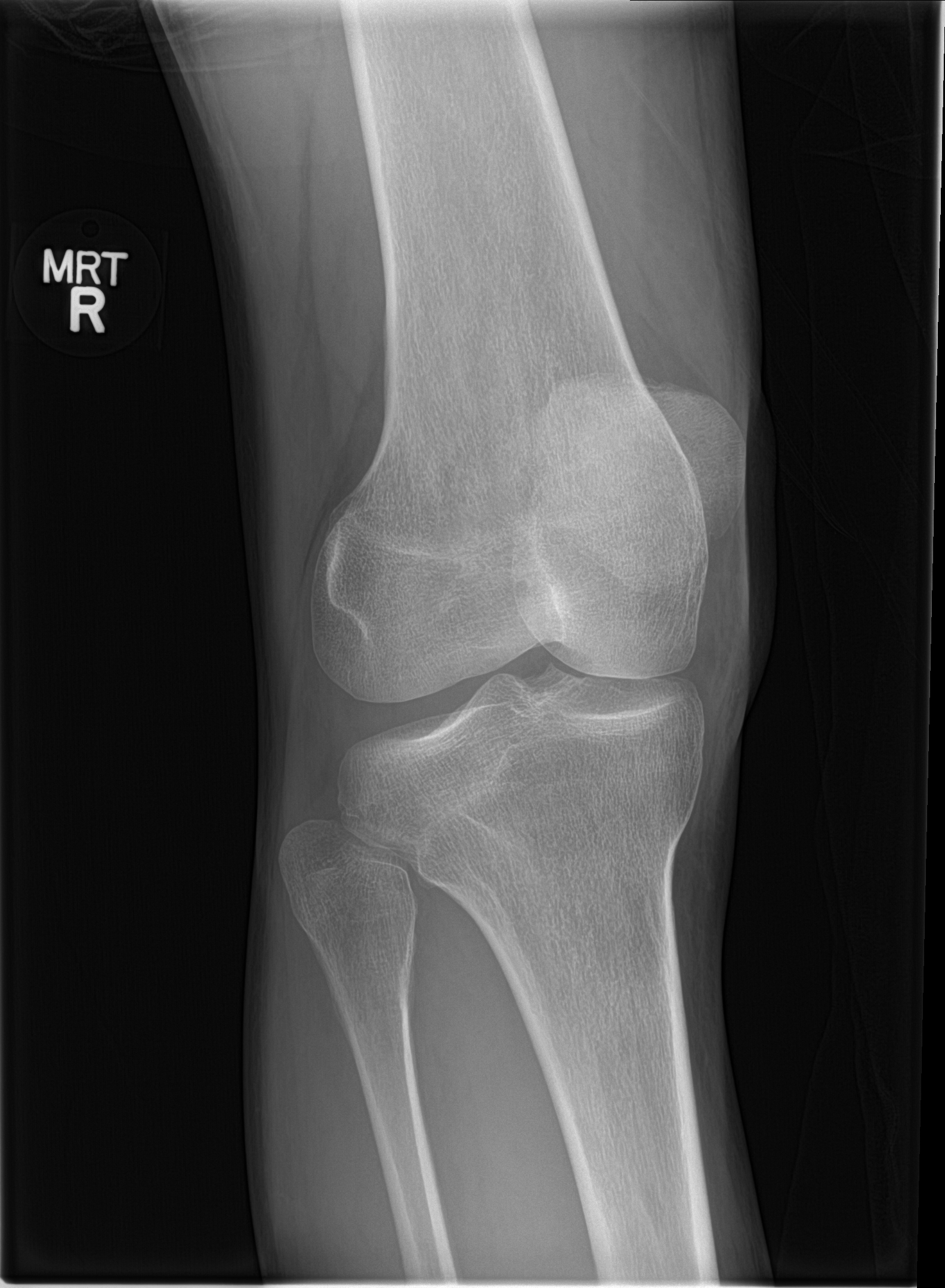

[knee obl (2 of 2)]
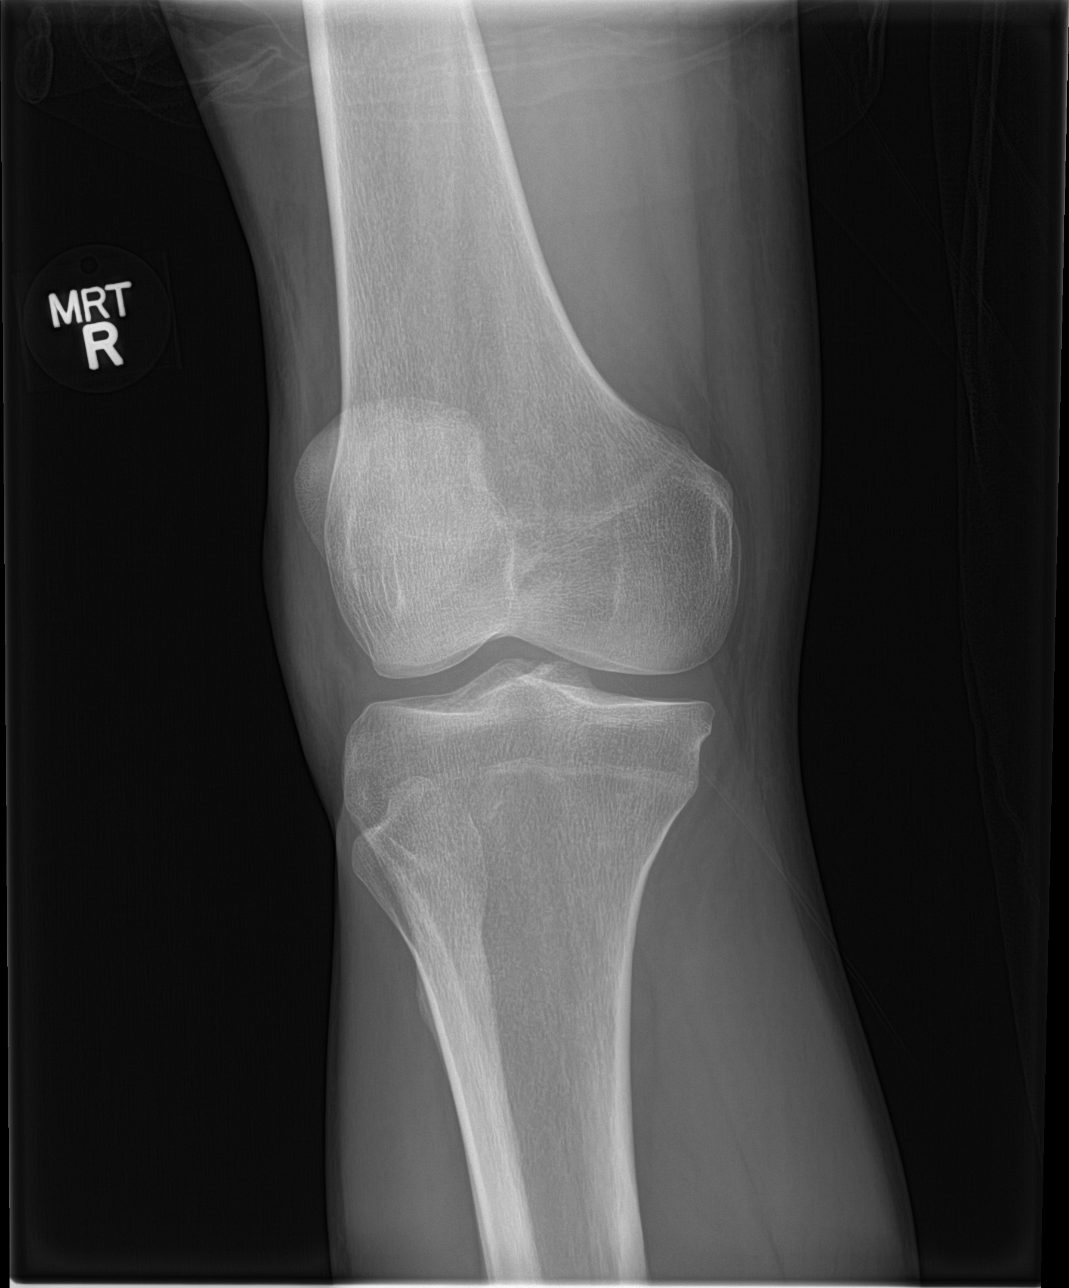

[4 of 4 positions shown; findings below may reference images not displayed]

FINDINGS: Mild anterior soft tissue swelling just below the patella. No
fractures or effusions.
IMPRESSION: Soft-tissue swelling with no fractures.

## 2017-10-20 ENCOUNTER — Other Ambulatory Visit: Payer: Self-pay | Admitting: Thoracic Surgery (Cardiothoracic Vascular Surgery)

## 2017-10-20 DIAGNOSIS — Z9889 Other specified postprocedural states: Secondary | ICD-10-CM

## 2017-10-23 NOTE — Progress Notes (Deleted)
Cardiology Office Note   Date:  10/23/2017   ID:  Steven Montoya., DOB 1975/11/14, MRN [id number]  PCP:  Patient, No Pcp Per  Cardiologist:  Dr. Anne Fu    No chief complaint on file.     History of Present Illness: Steven Montoya. is a 42 y.o. male who presents for post hospital for minimally invasive Mitral valve repair with complex valvuloplasty including triangular resection of posterior leaflet Artificial Gore-tex neochord placement x6;  Sorin Memo 3D Ring Annuloplasty (size 34 mm, catalog # [id number], serial # [id number])  09/07/17 by Dr. Cornelius Moras.  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     He has a history of mitral valve prolapse and moderate mitral regurgitation. Initially seen here back in February of 2018 - noted that during entrance into [facility] facility, MD noted loud murmur. ECHO ordered and showed MVP with moderate MR, normal EF and mild LAE. 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  Past Medical History:  Diagnosis Date  . Anxiety   . Chronic urethral stricture   . Depression   . Diverticulitis   . Frequency of urination   . Headache(784.0)   . Lower abdominal pain   . Mitral valve prolapse   . Nocturia   . 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 Urgency of urination   . Urinary straining     Past Surgical History:  Procedure Laterality Date  . COLONOSCOPY N/A 08/17/2013   Procedure: COLONOSCOPY;  Surgeon: Romie Levee, MD;  Location: [facility] ENDOSCOPY;  Service: Endoscopy;  Laterality: N/A;  . CYSTO/ RETROGRADE PYELOGRAM/ URETHRAL DILATION  11-05-2010  . MITRAL VALVE REPAIR Right 09/07/2017   Procedure: MINIMALLY  INVASIVE MITRAL VALVE REPAIR (MVR) using Sorin Memo 3D Ring size 34;  Surgeon: Purcell Nails, MD;  Location: [facility] OR;  Service: Open Heart Surgery;  Laterality: Right;  . RIGHT/LEFT HEART CATH AND CORONARY ANGIOGRAPHY N/A 07/22/2017   Procedure: RIGHT/LEFT HEART CATH AND CORONARY ANGIOGRAPHY;  Surgeon: Tonny Bollman, MD;  Location: [facility] INVASIVE CV LAB;  Service: Cardiovascular;  Laterality: N/A;  . TEE WITHOUT CARDIOVERSION N/A 06/28/2017   Procedure: TRANSESOPHAGEAL ECHOCARDIOGRAM (TEE);  Surgeon: Jake Bathe, MD;  Location: [facility] ENDOSCOPY;  Service: Cardiovascular;  Laterality: N/A;  . TEE WITHOUT CARDIOVERSION N/A 09/07/2017   Procedure: TRANSESOPHAGEAL ECHOCARDIOGRAM (TEE);  Surgeon: Purcell Nails, MD;  Location: [facility] OR;  Service: Open Heart Surgery;  Laterality: N/A;  . TOOTH EXTRACTION     canines     Current Outpatient Medications  Medication Sig Dispense Refill  . acetaminophen (TYLENOL) 500 MG tablet Take 1,000-1,500 mg by mouth every 6 (six) hours as needed for mild pain or moderate pain (depends on pain if takes 2-3 tablets).     Marland Kitchen aspirin EC 81 MG EC tablet Take 1 tablet (81 mg total) by mouth daily.    . diphenhydramine-acetaminophen (TYLENOL PM) 25-500 MG TABS tablet Take 3 tablets by mouth at bedtime.    . furosemide (LASIX) 40 MG tablet Take 1 tablet (40 mg total) by mouth daily. 7 tablet 0  . oxyCODONE 10 MG TABS Take  1 tablet (10 mg total) by mouth every 4 (four) hours as needed for severe pain. 40 tablet 0  . potassium chloride SA (K-DUR,KLOR-CON) 20 MEQ tablet Take 1 tablet (20 mEq total) by mouth daily. 7 tablet 0  . traMADol (ULTRAM) 50 MG tablet Take 1 tablet (50 mg total) by mouth every 6 (six) hours as needed. 40 tablet 0  . warfarin (COUMADIN) 5 MG tablet Take 1 tablet (5 mg total) by mouth daily. 30 tablet 11   No current facility-administered medications for this visit.     Allergies:   Latex and Adhesive [tape]    Social History:  The patient  reports that  he has been smoking cigarettes.  He has a 2.00 pack-year smoking history. His smokeless tobacco use includes chew and snuff. He reports that he does not drink alcohol or use drugs.   Family History:  The patient's ***family history includes Heart disease in his father.    ROS:  General:no colds or fevers, no weight changes Skin:no rashes or ulcers HEENT:no blurred vision, no congestion CV:see HPI PUL:see HPI GI:no diarrhea constipation or melena, no indigestion GU:no hematuria, no dysuria MS:no joint pain, no claudication Neuro:no syncope, no lightheadedness Endo:no diabetes, no thyroid disease Wt Readings from Last 3 Encounters:  09/12/17 142 lb 1.6 oz (64.5 kg)  09/05/17 145 lb 14.4 oz (66.2 kg)  09/05/17 145 lb 6.4 oz (66 kg)     PHYSICAL EXAM: VS:  There were no vitals taken for this visit. , BMI There is no height or weight on file to calculate BMI. General:Pleasant affect, NAD Skin:Warm and dry, brisk capillary refill HEENT:normocephalic, sclera clear, mucus membranes moist Neck:supple, no JVD, no bruits  Heart:S1S2 RRR without murmur, gallup, rub or click Lungs:clear without rales, rhonchi, or wheezes LKG:MWNUAbd:soft, non tender, + BS, do not palpate liver spleen or masses Ext:no lower ext edema, 2+ pedal pulses, 2+ radial pulses Neuro:alert and oriented, MAE, follows commands, + facial symmetry    EKG:  EKG is ordered today. The ekg ordered today demonstrates ***   Recent Labs: 06/06/2017: TSH 0.632 09/05/2017: ALT 9 09/08/2017: Magnesium 1.8 09/09/2017: Hemoglobin 10.3; Platelets 149 09/10/2017: BUN 9; Creatinine, Ser 0.81; Potassium 4.1; Sodium 136    Lipid Panel No results found for: CHOL, TRIG, HDL, CHOLHDL, VLDL, LDLCALC, LDLDIRECT     Other studies Reviewed: Additional studies/ records that were reviewed today include: ***.   ASSESSMENT AND PLAN:  1.  ***   Current medicines are reviewed with the patient today.  The patient Has no concerns regarding  medicines.  The following changes have been made:  See above Labs/ tests ordered today include:see above  Disposition:   FU:  see above  Signed, Nada BoozerLaura Ingold, NP  10/23/2017 4:43 PM    [facility] [address], [place], [place]  [address]/ [address] [place], [place] Phone: [phone]; Fax: [phone]  [phone]

## 2017-10-24 ENCOUNTER — Ambulatory Visit: Payer: Self-pay | Admitting: Thoracic Surgery (Cardiothoracic Vascular Surgery)

## 2017-10-24 ENCOUNTER — Telehealth: Payer: Self-pay | Admitting: *Deleted

## 2017-10-24 ENCOUNTER — Ambulatory Visit: Payer: Medicaid Other | Admitting: Cardiology

## 2017-10-24 NOTE — Telephone Encounter (Signed)
Left pt a message to call me back.  I need to see why pt hasn't kept his appts.  Need to know if he has had his INR checked anywhere.

## 2017-10-25 ENCOUNTER — Encounter: Payer: Self-pay | Admitting: Cardiology

## 2017-10-27 ENCOUNTER — Telehealth: Payer: Self-pay

## 2017-10-27 NOTE — Telephone Encounter (Signed)
LMOM to call back. He needs to reschedule post op appointment with Dr Cornelius Moraswen. Also needs to have his [facility] ASAP.

## 2017-11-03 NOTE — Telephone Encounter (Signed)
Was this a mistake sending this to me? I have not seen this patient before. Najma Bozarth PA-C

## 2017-11-03 NOTE — Telephone Encounter (Signed)
Did get in touch with pt.  He stated that he has had a problem with transportation is the reason he hasn't made any of his f/u appts.  He was advised of the importance of the follow up appts since he had a major surgery.  Pt did schedule f/u with Boyce MediciBrittany Simmons, PA-C 11/08/17.  He was advised to contact Dr. Orvan Julywen's office as well to schedule f/u with them.  Pt verbalized understanding.

## 2017-11-03 NOTE — Telephone Encounter (Signed)
Thank you :)

## 2017-11-08 ENCOUNTER — Emergency Department (HOSPITAL_COMMUNITY): Payer: Medicaid Other

## 2017-11-08 ENCOUNTER — Ambulatory Visit: Payer: Medicaid Other | Admitting: Cardiology

## 2017-11-08 ENCOUNTER — Emergency Department (HOSPITAL_COMMUNITY)
Admission: EM | Admit: 2017-11-08 | Discharge: 2017-11-09 | Disposition: A | Discharge status: Home / Self Care | Payer: Medicaid Other | Attending: Emergency Medicine | Admitting: Emergency Medicine

## 2017-11-08 ENCOUNTER — Encounter (HOSPITAL_COMMUNITY): Payer: Self-pay

## 2017-11-08 DIAGNOSIS — R45851 Suicidal ideations: Secondary | ICD-10-CM | POA: Insufficient documentation

## 2017-11-08 DIAGNOSIS — F17210 Nicotine dependence, cigarettes, uncomplicated: Secondary | ICD-10-CM | POA: Diagnosis not present

## 2017-11-08 DIAGNOSIS — F329 Major depressive disorder, single episode, unspecified: Secondary | ICD-10-CM | POA: Diagnosis not present

## 2017-11-08 DIAGNOSIS — Z91040 Latex allergy status: Secondary | ICD-10-CM | POA: Diagnosis not present

## 2017-11-08 DIAGNOSIS — R079 Chest pain, unspecified: Secondary | ICD-10-CM | POA: Diagnosis not present

## 2017-11-08 DIAGNOSIS — Z139 Encounter for screening, unspecified: Secondary | ICD-10-CM

## 2017-11-08 LAB — COMPREHENSIVE METABOLIC PANEL
ALBUMIN: 3.8 g/dL (ref 3.5–5.0)
ALT: 16 U/L — AB (ref 17–63)
AST: 24 U/L (ref 15–41)
Alkaline Phosphatase: 79 U/L (ref 38–126)
Anion gap: 10 (ref 5–15)
BUN: 6 mg/dL (ref 6–20)
CHLORIDE: 105 mmol/L (ref 101–111)
CO2: 23 mmol/L (ref 22–32)
CREATININE: 0.84 mg/dL (ref 0.61–1.24)
Calcium: 9.3 mg/dL (ref 8.9–10.3)
GFR calc Af Amer: >60 mL/min (ref >60)
GFR calc non Af Amer: >60 mL/min (ref >60)
GLUCOSE: 91 mg/dL (ref 65–99)
Potassium: 4 mmol/L (ref 3.5–5.1)
SODIUM: 138 mmol/L (ref 135–145)
Total Bilirubin: 0.3 mg/dL (ref 0.3–1.2)
Total Protein: 6.5 g/dL (ref 6.5–8.1)

## 2017-11-08 LAB — RAPID URINE DRUG SCREEN, HOSP PERFORMED
AMPHETAMINES: NOT DETECTED
Barbiturates: NOT DETECTED
Benzodiazepines: POSITIVE — AB
Cocaine: NOT DETECTED
OPIATES: NOT DETECTED
TETRAHYDROCANNABINOL: POSITIVE — AB

## 2017-11-08 LAB — ETHANOL: Alcohol, Ethyl (B): <10 mg/dL (ref <10)

## 2017-11-08 LAB — CBC
HEMATOCRIT: 41.1 % (ref 39.0–52.0)
HEMOGLOBIN: 13.8 g/dL (ref 13.0–17.0)
MCH: 30.4 pg (ref 26.0–34.0)
MCHC: 33.6 g/dL (ref 30.0–36.0)
MCV: 90.5 fL (ref 78.0–100.0)
Platelets: 349 10*3/uL (ref 150–400)
RBC: 4.54 MIL/uL (ref 4.22–5.81)
RDW: 14.7 % (ref 11.5–15.5)
WBC: 8.7 10*3/uL (ref 4.0–10.5)

## 2017-11-08 LAB — PROTIME-INR
INR: 0.95
Prothrombin Time: 12.6 seconds (ref 11.4–15.2)

## 2017-11-08 LAB — ACETAMINOPHEN LEVEL: Acetaminophen (Tylenol), Serum: <10 ug/mL — ABNORMAL LOW (ref 10–30)

## 2017-11-08 LAB — I-STAT TROPONIN, ED: Troponin i, poc: 0.02 ng/mL (ref 0.00–0.08)

## 2017-11-08 LAB — SALICYLATE LEVEL

## 2017-11-08 MED ORDER — NICOTINE 21 MG/24HR TD PT24
21.0000 mg | MEDICATED_PATCH | Freq: Once | TRANSDERMAL | Status: DC
  Administered 2017-11-08: 21 mg via TRANSDERMAL
  Filled 2017-11-08: qty 1

## 2017-11-08 MED ORDER — LORAZEPAM 1 MG PO TABS
1.0000 mg | ORAL_TABLET | Freq: Once | ORAL | Status: AC
  Administered 2017-11-08: 1 mg via ORAL
  Filled 2017-11-08: qty 1

## 2017-11-08 MED ORDER — OXYCODONE HCL 5 MG PO TABS
10.0000 mg | ORAL_TABLET | Freq: Once | ORAL | Status: AC
  Administered 2017-11-08: 10 mg via ORAL
  Filled 2017-11-08: qty 2

## 2017-11-08 NOTE — ED Notes (Signed)
Patient complaining of 10/10 mid chest pain. EDP made aware. Will obtain ekg when patient allows.

## 2017-11-08 NOTE — ED Notes (Signed)
Blue top drawn by this RN

## 2017-11-08 NOTE — ED Notes (Signed)
Patient transported to X-ray 

## 2017-11-08 NOTE — ED Triage Notes (Signed)
Patient complains of feeling down and depressed with suicidal thoughts for several weeks. Was suppose to be taking behavioral meds but none for years. Had valve repair in January and not taking coumadin as prescribed. Reports no ETOH use but does report marijuana. Alert and oriented with flat affect

## 2017-11-08 NOTE — ED Notes (Signed)
Pts belongings inventoried and placed in locker #1 

## 2017-11-09 NOTE — ED Provider Notes (Signed)
Patient cleared by TTS.  1:40 AM Reassessed by me.  Denies SI or HI.  States that he sees how what he said could have been taken the wrong way, but doesn't feel suicidal.  Agreeable with DC plan.   Roxy HorsemanBrowning, Kalisha Keadle, PA-C 11/09/17 0141    Zadie RhineWickline, Donald, MD 11/09/17 [phone]
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
## 2017-11-09 NOTE — ED Provider Notes (Signed)
[name] EMERGENCY DEPARTMENT Provider Note   CSN: [id number] Arrival date & time: 11/08/17  1344     History   Chief Complaint No chief complaint on file.   HPI Steven Montoya. is a 42 y.o. male.  HPI Patient presents to the emergency department with suicidal ideations and increasing anger.  The patient states that he does not want to be here and states that he is mad at 1 of the nurses for questioning whether he had open heart surgery.  Patient states he is a chronic chest pain and is having chest pain today.  The patient states that he is more mad at the nursing would like to leave based off of this fact.  Patient states that nothing seems to make the condition better or worse.  She states he has had a lot of increasing depression over the several weeks.  Patient states that he has used drugs and alcohol in the past.  The patient denies  shortness of breath, headache,blurred vision, neck pain, fever, cough, weakness, numbness, dizziness, anorexia, edema, abdominal pain, nausea, vomiting, diarrhea, rash, back pain, dysuria, hematemesis, bloody stool, near syncope, or syncope. Past Medical History:  Diagnosis Date  . Anxiety   . Chronic urethral stricture   . Depression   . Diverticulitis   . Frequency of urination   . Headache(784.0)   . Lower abdominal pain   . Mitral valve prolapse   . Nocturia   . 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 Urgency of urination   . Urinary straining     Patient Active Problem List   Diagnosis Date Noted  . 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 MVP (mitral valve prolapse) 10/02/2016  . Non-rheumatic mitral regurgitation 10/02/2016  . Smoker 10/02/2016  . 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 Depressive disorder 12/31/2015  . Rectal bleeding 07/25/2013    Past Surgical History:  Procedure Laterality Date  . COLONOSCOPY N/A 08/17/2013   Procedure: COLONOSCOPY;  Surgeon: Romie Levee, MD;  Location: [facility];  Service: Endoscopy;  Laterality: N/A;  . CYSTO/ RETROGRADE PYELOGRAM/ URETHRAL DILATION  11-05-2010  . MITRAL VALVE REPAIR Right 09/07/2017   Procedure: MINIMALLY INVASIVE MITRAL VALVE REPAIR (MVR) using Sorin Memo 3D Ring size 34;  Surgeon: Purcell Nails, MD;  Location: [facility];  Service: Open Heart Surgery;  Laterality: Right;  . RIGHT/LEFT HEART CATH AND CORONARY ANGIOGRAPHY N/A 07/22/2017   Procedure: RIGHT/LEFT HEART CATH AND CORONARY ANGIOGRAPHY;  Surgeon: Tonny Bollman, MD;  Location: [facility] INVASIVE CV LAB;  Service: Cardiovascular;  Laterality: N/A;  . TEE WITHOUT CARDIOVERSION N/A 06/28/2017   Procedure: TRANSESOPHAGEAL ECHOCARDIOGRAM (TEE);  Surgeon: Jake Bathe, MD;  Location: [facility] ENDOSCOPY;  Service: Cardiovascular;  Laterality: N/A;  . TEE WITHOUT CARDIOVERSION N/A 09/07/2017   Procedure: TRANSESOPHAGEAL ECHOCARDIOGRAM (TEE);  Surgeon: Purcell Nails, MD;  Location[facility] OR;  Service: Open Heart Surgery;  Laterality: N/A;  . TOOTH EXTRACTION     canines       Home Medications    Prior to Admission medications   Medication Sig Start Date End Date Taking? Authorizing Provider  Aspirin-Acetaminophen-Caffeine (GOODY HEADACHE PO) Take 1 Package by mouth as needed.   Yes [provider]  aspirin EC 81 MG EC tablet Take  1 tablet (81 mg total) by mouth daily. Patient not taking: Reported on 11/08/2017 09/12/17   Barrett, Rae Roam, PA-C  furosemide (LASIX) 40 MG tablet Take 1 tablet (40 mg total) by mouth daily. Patient not taking: Reported on 11/08/2017 09/12/17   Barrett, Rae Roam, PA-C  oxyCODONE 10 MG TABS Take 1 tablet (10 mg total) by mouth every 4 (four) hours as needed for severe pain. Patient not  taking: Reported on 11/08/2017 09/12/17   Barrett, Denny Peon R, PA-C  potassium chloride SA (K-DUR,KLOR-CON) 20 MEQ tablet Take 1 tablet (20 mEq total) by mouth daily. Patient not taking: Reported on 11/08/2017 09/12/17   Barrett, Rae Roam, PA-C  traMADol (ULTRAM) 50 MG tablet Take 1 tablet (50 mg total) by mouth every 6 (six) hours as needed. Patient not taking: Reported on 11/08/2017 09/21/17   Purcell Nails, MD  warfarin (COUMADIN) 5 MG tablet Take 1 tablet (5 mg total) by mouth daily. Patient not taking: Reported on 11/08/2017 09/12/17 09/12/18  Barrett, Rae Roam, PA-C    Family History Family History  Problem Relation Age of Onset  . Heart disease Father     Social History Social History   Tobacco Use  . Smoking status: Current Every Day Smoker    Packs/day: 0.50    Years: 4.00    Pack years: 2.00    Types: Cigarettes  . Smokeless tobacco: Current User    Types: Chew, Snuff  Substance Use Topics  . Alcohol use: No    Alcohol/week: 100.8 oz    Types: 168 Cans of beer per week    Comment: Using for 25 years  . Drug use: No    Comment: pt is in jail     Allergies   Latex and Adhesive [tape]   Review of Systems Review of Systems All other systems negative except as documented in the HPI. All pertinent positives and negatives as reviewed in the HPI. Physical Exam Updated Vital Signs BP (!) 140/109 (BP Location: Right Arm)   Pulse (!) 103   Temp 98.2 F (36.8 C) (Oral)   SpO2 95%   Physical Exam  Constitutional: He is oriented to person, place, and time. He appears well-developed and well-nourished. No distress.  HENT:  Head: Normocephalic and atraumatic.  Mouth/Throat: Oropharynx is clear and moist.  Eyes: Pupils are equal, round, and reactive to light.  Neck: Normal range of motion. Neck supple.  Cardiovascular: Normal rate, regular rhythm and normal heart sounds. Exam reveals no gallop and no friction rub.  No murmur heard. Pulmonary/Chest: Effort normal and breath  sounds normal. No respiratory distress. He has no wheezes.  Abdominal: Soft. Bowel sounds are normal. He exhibits no distension. There is no tenderness.  Neurological: He is alert and oriented to person, place, and time. He exhibits normal muscle tone. Coordination normal.  Skin: Skin is warm and dry. Capillary refill takes less than 2 seconds. No rash noted. No erythema.  Psychiatric: His behavior is normal. His affect is angry. Thought content is not paranoid and not delusional. He expresses suicidal ideation. He expresses no homicidal ideation. He expresses suicidal plans. He expresses no homicidal plans.  Nursing note and vitals reviewed.    ED Treatments / Results  Labs (all labs ordered are listed, but only abnormal results are displayed) Labs Reviewed  COMPREHENSIVE METABOLIC PANEL - Abnormal; Notable for the following components:      Result Value   ALT 16 (*)    All other components within normal limits  ACETAMINOPHEN LEVEL - Abnormal; Notable for the following components:   Acetaminophen (Tylenol), Serum <10 (*)    All other components within normal limits  RAPID URINE DRUG SCREEN, HOSP PERFORMED - Abnormal; Notable for the following components:   Benzodiazepines POSITIVE (*)    Tetrahydrocannabinol POSITIVE (*)    All other components within normal limits  ETHANOL  SALICYLATE LEVEL  CBC  PROTIME-INR  I-STAT TROPONIN, ED    EKG  EKG Interpretation  Date/Time:  Tuesday November 08 2017 18:02:14 EDT Ventricular Rate:  98 PR Interval:  138 QRS Duration: 82 QT Interval:  364 QTC Calculation: 464 R Axis:   84 Text Interpretation:  Normal sinus rhythm Left ventricular hypertrophy Abnormal ECG Artifact When compared with ECG of 09/11/2017, Nonspecific T wave abnormality is no longer present Confirmed by Dione BoozeGlick, David ([id number]) on 11/08/2017 11:28:43 PM       Radiology Dg Chest 2 View  Result Date: 11/08/2017 CLINICAL DATA:  Chest pain EXAM: CHEST - 2 VIEW COMPARISON:   09/11/2017 FINDINGS: Mitral valve replacement. Improved aeration of the lung bases. Lung bases now clear without infiltrate or effusion. No heart failure. Right pneumothorax has cleared IMPRESSION: No active cardiopulmonary disease. Electronically Signed   By: Marlan Palauharles  Clark M.D.   On: 11/08/2017 18:55    Procedures Procedures (including critical care time)  Medications Ordered in ED Medications  nicotine (NICODERM CQ - dosed in mg/24 hours) patch 21 mg (21 mg Transdermal Patch Applied 11/08/17 1803)  LORazepam (ATIVAN) tablet 1 mg (1 mg Oral Given 11/08/17 1803)  oxyCODONE (Oxy IR/ROXICODONE) immediate release tablet 10 mg (10 mg Oral Given 11/08/17 1942)     Initial Impression / Assessment and Plan / ED Course  I have reviewed the triage vital signs and the nursing notes.  Pertinent labs & imaging results that were available during my care of the patient were reviewed by me and considered in my medical decision making (see chart for details).     Patient will need a TTS assessment for his chronic pression and now suicidal ideations.  Patient states he does have a plan to kill himself with Coumadin. Final Clinical Impressions(s) / ED Diagnoses   Final diagnoses:  None    ED Discharge Orders    None       Kyra MangesLawyer, Brixton Franko, PA-C 11/13/17 1630    Azalia Bilisampos, Kevin, MD 11/14/17 [phone]

## 2017-11-15 ENCOUNTER — Encounter: Payer: Self-pay | Admitting: Cardiology

## 2017-12-13 ENCOUNTER — Telehealth: Payer: Self-pay

## 2017-12-13 NOTE — Telephone Encounter (Signed)
Called both home and cell phone numbers returning Mr. Steven Montoya's message yesterday.  Stated that he needed an appointment to see Dr. Cornelius Moraswen s/p surgery for having chest pain.  He has not been seen in the office for a post-op appointment, he has either cancelled or no showed.  Left a message for him to give the office a call back.

## 2018-09-24 ENCOUNTER — Emergency Department (HOSPITAL_COMMUNITY)
Admission: EM | Admit: 2018-09-24 | Discharge: 2018-09-24 | Disposition: A | Discharge status: Home / Self Care | Payer: Medicaid Other | Attending: Emergency Medicine | Admitting: Emergency Medicine

## 2018-09-24 ENCOUNTER — Emergency Department (HOSPITAL_COMMUNITY): Payer: Medicaid Other

## 2018-09-24 DIAGNOSIS — R112 Nausea with vomiting, unspecified: Secondary | ICD-10-CM | POA: Diagnosis not present

## 2018-09-24 DIAGNOSIS — F419 Anxiety disorder, unspecified: Secondary | ICD-10-CM | POA: Diagnosis not present

## 2018-09-24 DIAGNOSIS — Z91040 Latex allergy status: Secondary | ICD-10-CM | POA: Diagnosis not present

## 2018-09-24 DIAGNOSIS — R109 Unspecified abdominal pain: Secondary | ICD-10-CM

## 2018-09-24 DIAGNOSIS — F17210 Nicotine dependence, cigarettes, uncomplicated: Secondary | ICD-10-CM | POA: Diagnosis not present

## 2018-09-24 DIAGNOSIS — R1031 Right lower quadrant pain: Secondary | ICD-10-CM | POA: Insufficient documentation

## 2018-09-24 DIAGNOSIS — F329 Major depressive disorder, single episode, unspecified: Secondary | ICD-10-CM | POA: Insufficient documentation

## 2018-09-24 LAB — URINALYSIS, ROUTINE W REFLEX MICROSCOPIC
BACTERIA UA: NONE SEEN
BILIRUBIN URINE: NEGATIVE
GLUCOSE, UA: NEGATIVE mg/dL
HGB URINE DIPSTICK: NEGATIVE
Ketones, ur: NEGATIVE mg/dL
NITRITE: NEGATIVE
PROTEIN: NEGATIVE mg/dL
Specific Gravity, Urine: 1.014 (ref 1.005–1.030)
pH: 7 (ref 5.0–8.0)

## 2018-09-24 LAB — CBC WITH DIFFERENTIAL/PLATELET
ABS IMMATURE GRANULOCYTES: 0.02 10*3/uL (ref 0.00–0.07)
BASOS ABS: 0 10*3/uL (ref 0.0–0.1)
Basophils Relative: 0 %
Eosinophils Absolute: 0.2 10*3/uL (ref 0.0–0.5)
Eosinophils Relative: 2 %
HEMATOCRIT: 41.2 % (ref 39.0–52.0)
HEMOGLOBIN: 13.3 g/dL (ref 13.0–17.0)
Immature Granulocytes: 0 %
LYMPHS PCT: 24 %
Lymphs Abs: 2.3 10*3/uL (ref 0.7–4.0)
MCH: 29.2 pg (ref 26.0–34.0)
MCHC: 32.3 g/dL (ref 30.0–36.0)
MCV: 90.4 fL (ref 80.0–100.0)
MONOS PCT: 6 %
Monocytes Absolute: 0.6 10*3/uL (ref 0.1–1.0)
NEUTROS ABS: 6.5 10*3/uL (ref 1.7–7.7)
NRBC: 0 % (ref 0.0–0.2)
Neutrophils Relative %: 68 %
Platelets: 367 10*3/uL (ref 150–400)
RBC: 4.56 MIL/uL (ref 4.22–5.81)
RDW: 13.7 % (ref 11.5–15.5)
WBC: 9.7 10*3/uL (ref 4.0–10.5)

## 2018-09-24 LAB — COMPREHENSIVE METABOLIC PANEL
ALT: 14 U/L (ref 0–44)
AST: 19 U/L (ref 15–41)
Albumin: 3.3 g/dL — ABNORMAL LOW (ref 3.5–5.0)
Alkaline Phosphatase: 50 U/L (ref 38–126)
Anion gap: 11 (ref 5–15)
BUN: 8 mg/dL (ref 6–20)
CALCIUM: 9.5 mg/dL (ref 8.9–10.3)
CHLORIDE: 99 mmol/L (ref 98–111)
CO2: 27 mmol/L (ref 22–32)
Creatinine, Ser: 0.93 mg/dL (ref 0.61–1.24)
GLUCOSE: 101 mg/dL — AB (ref 70–99)
POTASSIUM: 3.9 mmol/L (ref 3.5–5.1)
Sodium: 137 mmol/L (ref 135–145)
Total Bilirubin: 0.5 mg/dL (ref 0.3–1.2)
Total Protein: 6.1 g/dL — ABNORMAL LOW (ref 6.5–8.1)

## 2018-09-24 LAB — OCCULT BLOOD GASTRIC / DUODENUM (SPECIMEN CUP): Occult Blood, Gastric: POSITIVE — AB

## 2018-09-24 LAB — LIPASE, BLOOD: Lipase: 24 U/L (ref 11–51)

## 2018-09-24 MED ORDER — IOHEXOL 300 MG/ML  SOLN
100.0000 mL | Freq: Once | INTRAMUSCULAR | Status: AC | PRN
  Administered 2018-09-24: 100 mL via INTRAVENOUS

## 2018-09-24 MED ORDER — OMEPRAZOLE 20 MG PO CPDR: 20.0000 mg | DELAYED_RELEASE_CAPSULE | Freq: Every day | ORAL | 0 refills | Status: DC

## 2018-09-24 MED ORDER — SODIUM CHLORIDE 0.9 % IV BOLUS
1000.0000 mL | Freq: Once | INTRAVENOUS | Status: AC
  Administered 2018-09-24: 1000 mL via INTRAVENOUS

## 2018-09-24 MED ORDER — ONDANSETRON HCL 4 MG/2ML IJ SOLN
4.0000 mg | Freq: Once | INTRAMUSCULAR | Status: AC
  Administered 2018-09-24: 4 mg via INTRAVENOUS
  Filled 2018-09-24: qty 2

## 2018-09-24 MED ORDER — MORPHINE SULFATE (PF) 4 MG/ML IV SOLN
4.0000 mg | Freq: Once | INTRAVENOUS | Status: AC
  Administered 2018-09-24: 4 mg via INTRAVENOUS
  Filled 2018-09-24: qty 1

## 2018-09-24 MED ORDER — MORPHINE SULFATE (PF) 4 MG/ML IV SOLN
6.0000 mg | Freq: Once | INTRAVENOUS | Status: AC
  Administered 2018-09-24: 6 mg via INTRAVENOUS
  Filled 2018-09-24 (×2): qty 2

## 2018-09-24 MED ORDER — METOCLOPRAMIDE HCL 10 MG PO TABS: 10.0000 mg | ORAL_TABLET | Freq: Three times a day (TID) | ORAL | 0 refills | Status: DC | PRN

## 2018-09-24 NOTE — ED Notes (Signed)
Taken to CT.

## 2018-09-24 NOTE — ED Provider Notes (Signed)
[name] EMERGENCY DEPARTMENT Provider Note   CSN: [id number] Arrival date & time: 09/24/18  1706     History   Chief Complaint Chief Complaint  Patient presents with  . Abdominal Pain  . Emesis    HPI Steven Montoya. is a 43 y.o. male with history of mitral valve repair in 08/2017 presenting to the emergency department with chief complaint of abdominal pain and emesis. The pain as been present x 4 days. It was intermittent, but has been constant x2 days. The abdominal pain is located in right lower quadrant. It does not radiate. He describes it as feeling his intestines are being squeezed. He rates the pain 10/10 severity.  He reports subjective fever x1 day ago. He did not take any medications for his abdominal pain or fever prior to arrival.  Pt reports associated emesis that started with the abdominal pain. Also reports bright red blood streaks in vomit. He has not been able to tolerate PO intake. Pt denies alcohol use. Denies melena, hematochezia, diaphoresis, chest pain, back pain.    Past Medical History:  Diagnosis Date  . Anxiety   . Chronic urethral stricture   . Depression   . Diverticulitis   . Frequency of urination   . Headache(784.0)   . Lower abdominal pain   . Mitral valve prolapse   . Nocturia   . 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 Urgency of urination   . Urinary straining     Patient Active Problem List   Diagnosis Date Noted  . 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 MVP (mitral valve prolapse) 10/02/2016  . Non-rheumatic mitral regurgitation 10/02/2016  . Smoker 10/02/2016  . 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 Depressive disorder 12/31/2015  . Rectal  bleeding 07/25/2013    Past Surgical History:  Procedure Laterality Date  . COLONOSCOPY N/A 08/17/2013   Procedure: COLONOSCOPY;  Surgeon: Romie Levee, MD;  Location: [facility] ENDOSCOPY;  Service: Endoscopy;  Laterality: N/A;  . CYSTO/ RETROGRADE PYELOGRAM/ URETHRAL DILATION  11-05-2010  . MITRAL VALVE REPAIR Right 09/07/2017   Procedure: MINIMALLY INVASIVE MITRAL VALVE REPAIR (MVR) using Sorin Memo 3D Ring size 34;  Surgeon: Purcell Nails, MD;  Location: [facility] OR;  Service: Open Heart Surgery;  Laterality: Right;  . RIGHT/LEFT HEART CATH AND CORONARY ANGIOGRAPHY N/A 07/22/2017   Procedure: RIGHT/LEFT HEART CATH AND CORONARY ANGIOGRAPHY;  Surgeon: Tonny Bollman, MD;  Location: [facility] INVASIVE CV LAB;  Service: Cardiovascular;  Laterality: N/A;  . TEE WITHOUT CARDIOVERSION N/A 06/28/2017   Procedure: TRANSESOPHAGEAL ECHOCARDIOGRAM (TEE);  Surgeon: Jake Bathe, MD;  Location: [facility] ENDOSCOPY;  Service: Cardiovascular;  Laterality: N/A;  . TEE WITHOUT CARDIOVERSION N/A 09/07/2017   Procedure: TRANSESOPHAGEAL ECHOCARDIOGRAM (TEE);  Surgeon: Purcell Nails, MD;  Location: [facility] OR;  Service: Open Heart Surgery;  Laterality: N/A;  . TOOTH EXTRACTION     canines        Home Medications    Prior to Admission medications   Medication Sig Start Date End Date Taking? Authorizing Provider  acetaminophen (TYLENOL) 500 MG tablet Take 1,000 mg by mouth every 6 (six) hours as needed for headache (pain).   Yes [provider]  ALPRAZolam Prudy Feeler) 1 MG tablet Take 1 mg by mouth daily as needed  for anxiety.  07/05/18  Yes [provider]  aspirin EC 81 MG EC tablet Take 1 tablet (81 mg total) by mouth daily. Patient not taking: Reported on 11/08/2017 09/12/17   Barrett, Rae RoamErin R, PA-C  Buprenorphine HCl-Naloxone HCl 8-2 MG FILM Place 1 Film under the tongue 3 (three) times daily.    [provider]  furosemide (LASIX) 40 MG tablet Take 1 tablet (40 mg total) by mouth daily. Patient not  taking: Reported on 11/08/2017 09/12/17   Barrett, Rae RoamErin R, PA-C  metoCLOPramide (REGLAN) 10 MG tablet Take 1 tablet (10 mg total) by mouth every 8 (eight) hours as needed for nausea. 09/24/18   Elesha Thedford E, PA-C  omeprazole (PRILOSEC) 20 MG capsule Take 1 capsule (20 mg total) by mouth daily for 14 days. 09/24/18 10/08/18  Cendy Oconnor E, PA-C  oxyCODONE 10 MG TABS Take 1 tablet (10 mg total) by mouth every 4 (four) hours as needed for severe pain. Patient not taking: Reported on 11/08/2017 09/12/17   Barrett, Denny PeonErin R, PA-C  potassium chloride SA (K-DUR,KLOR-CON) 20 MEQ tablet Take 1 tablet (20 mEq total) by mouth daily. Patient not taking: Reported on 11/08/2017 09/12/17   Barrett, Rae RoamErin R, PA-C  traMADol (ULTRAM) 50 MG tablet Take 1 tablet (50 mg total) by mouth every 6 (six) hours as needed. Patient not taking: Reported on 11/08/2017 09/21/17   Purcell Nailswen, Clarence H, MD  warfarin (COUMADIN) 5 MG tablet Take 1 tablet (5 mg total) by mouth daily. Patient not taking: Reported on 09/24/2018 09/12/17 10/25/18  Barrett, Rae RoamErin R, PA-C    Family History Family History  Problem Relation Age of Onset  . Heart disease Father     Social History Social History   Tobacco Use  . Smoking status: Current Every Day Smoker    Packs/day: 0.50    Years: 4.00    Pack years: 2.00    Types: Cigarettes  . Smokeless tobacco: Current User    Types: Chew, Snuff  Substance Use Topics  . 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 Drug use: No    Types: Marijuana, "Crack" cocaine, Methamphetamines    Comment: pt is in jail     Allergies   Latex and Adhesive [tape]   Review of Systems Review of Systems  Constitutional: Positive for fever. Negative for chills and diaphoresis.  HENT: Negative for congestion, sinus pressure and sore throat.   Eyes: Negative for pain.  Respiratory: Negative for chest tightness and shortness of  breath.   Cardiovascular: Negative for chest pain and palpitations.  Gastrointestinal: Positive for abdominal pain, nausea and vomiting. Negative for diarrhea.  Genitourinary: Negative for difficulty urinating and hematuria.  Musculoskeletal: Negative for back pain and neck pain.  Skin: Negative for rash and wound.  Neurological: Negative for syncope, weakness and headaches.     Physical Exam Updated Vital Signs BP (!) 121/99 (BP Location: Left Arm)   Pulse 92   Temp 97.7 F (36.5 C) (Oral)   Resp 14   Ht 6' (1.829 m)   Wt 63.5 kg   SpO2 99%   BMI 18.99 kg/m   Physical Exam Vitals signs and nursing note reviewed.  Constitutional:      General: He is not in acute distress.    Appearance: He is not ill-appearing or toxic-appearing.     Comments: Appears uncomfortable due to  pain  HENT:     Head: Normocephalic and atraumatic.     Nose: Nose normal.     Mouth/Throat:     Mouth: Mucous membranes are moist.     Pharynx: Oropharynx is clear.  Eyes:     General: No scleral icterus.    Conjunctiva/sclera: Conjunctivae normal.     Pupils: Pupils are equal, round, and reactive to light.     Comments: EOM grossly intact  Neck:     Musculoskeletal: Normal range of motion and neck supple.  Cardiovascular:     Rate and Rhythm: Normal rate and regular rhythm.     Pulses: Normal pulses.     Heart sounds: Normal heart sounds.  Pulmonary:     Effort: Pulmonary effort is normal.     Breath sounds: Normal breath sounds.  Abdominal:     General: There is no distension.     Palpations: Abdomen is soft.     Tenderness: There is guarding. There is no rebound. Negative signs include Murphy's sign.     Comments: Tender to right lower quadrant  Musculoskeletal: Normal range of motion.     Comments: Full ROM in all 4 extremities  Skin:    General: Skin is warm and dry.     Capillary Refill: Capillary refill takes less than 2 seconds.     Coloration: Skin is not jaundiced or pale.    Neurological:     Mental Status: He is alert. Mental status is at baseline.     Motor: No weakness.  Psychiatric:        Behavior: Behavior normal.      ED Treatments / Results  Labs (all labs ordered are listed, but only abnormal results are displayed) Labs Reviewed  COMPREHENSIVE METABOLIC PANEL - Abnormal; Notable for the following components:      Result Value   Glucose, Bld 101 (*)    Total Protein 6.1 (*)    Albumin 3.3 (*)    All other components within normal limits  URINALYSIS, ROUTINE W REFLEX MICROSCOPIC - Abnormal; Notable for the following components:   APPearance CLOUDY (*)    Leukocytes, UA TRACE (*)    All other components within normal limits  OCCULT BLOOD GASTRIC / DUODENUM (SPECIMEN CUP) - Abnormal; Notable for the following components:   Occult Blood, Gastric POSITIVE (*)    All other components within normal limits  URINE CULTURE  CBC WITH DIFFERENTIAL/PLATELET  LIPASE, BLOOD    EKG None  Radiology Ct Abdomen Pelvis W Contrast  Result Date: 09/24/2018 CLINICAL DATA:  Bright red emesis. Generalized abdominal pain for 4 days. EXAM: CT ABDOMEN AND PELVIS WITH CONTRAST TECHNIQUE: Multidetector CT imaging of the abdomen and pelvis was performed using the standard protocol following bolus administration of intravenous contrast. CONTRAST:  100mL OMNIPAQUE IOHEXOL 300 MG/ML  SOLN COMPARISON:  08/01/2017 FINDINGS: Lower chest: Previous mitral valve procedure. Heart size is normal. There is a calcified pleural plaque on the right. This was not seen on the study of 08/01/2017. No evidence of pleural fluid. Lower lobes are clear. Hepatobiliary: Liver parenchyma is normal. No calcified gallstones. Pancreas: Normal Spleen: Normal Adrenals/Urinary Tract: Adrenal glands are normal. Kidneys are normal. No cyst, mass, stone or hydronephrosis. Bladder is normal. Stomach/Bowel: No abnormal bowel finding. Distal esophagus appears normal. Stomach appears normal. Small bowel and  colon appear normal. Vascular/Lymphatic: Aortic atherosclerosis without aneurysm. IVC is normal. No retroperitoneal adenopathy. Reproductive: Normal Other: No free fluid or air. Musculoskeletal: Normal IMPRESSION: 1. No  acute or significant finding. No cause of the presenting symptoms is identified. Gastrointestinal tract appears normal. 2. Previous mitral valve procedure. 3. 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 Despite the fact that it is recently developed, it does appear benign. Did the patient have any interval chest pathology that might have resulted in this? Aortic Atherosclerosis (ICD10-I70.0). Electronically Signed   By: Paulina Fusi M.D.   On: 09/24/2018 20:46    Procedures Procedures (including critical care time)  Medications Ordered in ED Medications  sodium chloride 0.9 % bolus 1,000 mL (0 mLs Intravenous Stopped 09/24/18 2000)  ondansetron (ZOFRAN) injection 4 mg (4 mg Intravenous Given 09/24/18 1813)  morphine 4 MG/ML injection 4 mg (4 mg Intravenous Given 09/24/18 1813)  sodium chloride 0.9 % bolus 1,000 mL (0 mLs Intravenous Stopped 09/24/18 1915)  morphine 4 MG/ML injection 6 mg (6 mg Intravenous Given 09/24/18 1927)  iohexol (OMNIPAQUE) 300 MG/ML solution 100 mL (100 mLs Intravenous Contrast Given 09/24/18 2018)     Initial Impression / Assessment and Plan / ED Course  I have reviewed the triage vital signs and the nursing notes.  Pertinent labs & imaging results that were available during my care of the patient were reviewed by me and considered in my medical decision making (see chart for details).  Pt is afebrile, not under acute distress. Pt presents with 4 days of right lower quadrant pain that has become constant over the last 2 days. The emesis is not suggestive of upper GI bleeding as he is not vomiting blood. He did not notice red streaks of blood until after  multiple episdoes of emesis. He vomited upon arrival to ED and gastric sample was positive for occult blood, which we suspected given his complaint of the red streaking.    DDX for his abdominal pain includes appendicitis, gastritis, cholecystitis. Will evaluate with CBC, CMP, lipase, UA. Will hydrate with IV fluids, treat nausea with zofran and pain with morphine.   On repeat exam pt reports his nausea improved. His abdominal pain has also improved and he states is now 2/10 severity. He is tolerating PO intake of fluids and crackers. His CMP, CBC is unremarkable.  His H/H today is 13.3/41.2 which seems to be his baseline compared to prior labs x 10 months ago. UA shows trace leukocytes and WBC 21-50. Pt denies urinary symptoms.  EKG shows sinus rhythm with probable normal repolarization, similar to prior.. I viewed his CT and it shows no acute or significant abdominal findings. Pt's abdominal pain is not suggestive of appendicitis given he does not have leukocytosis, fever, and no findings on CT and this pain has been present x 4 days.  Will give pt given prescription at discharge for Prilosec x 2 weeks to start a PPI trial because his pain could be gastritis. He will need to follow up with pcp for Stay term management.  While discussing ED return precautions pt mentioned he has had rectal bleeding intermittently x6 months describing it as bright red blood. I had a chaperone present to do a rectal exam given this new complaint, but the patient refused to have the exam. We discussed that with this complaint it is best to at least visualize the area in order to see if there needs to be a further intervention. Pt continued to decline the exam and says he will follow up with his doctor Dr. Anne Fu.  Instructed pt to also have his blood pressure rechecked because it was elevated while here in the ED.  Discussed strict ED return precautions. Pt verbalized understanding of and is in agreement with this plan. Pt  stable for discharge home at this time.   The patient was discussed with and seen by Dr. Clayborne Dana who agrees with the treatment plan.   Final Clinical Impressions(s) / ED Diagnoses   Final diagnoses:  Abdominal pain, unspecified abdominal location  Non-intractable vomiting with nausea, unspecified vomiting type    ED Discharge Orders         Ordered    metoCLOPramide (REGLAN) 10 MG tablet  Every 8 hours PRN     09/24/18 2225    omeprazole (PRILOSEC) 20 MG capsule  Daily     09/24/18 2225           Kathyrn Lass 09/24/18 2344    Mesner, Barbara Cower, MD 09/25/18 1554

## 2018-09-24 NOTE — ED Triage Notes (Addendum)
Pt c/o bright red vomiting along with generalized abd pain on and off x4 days ; pt denies any dark red stools; pt had open heart surgery x1 year ago ; denies any chest pain ; slight sob

## 2018-09-24 NOTE — Discharge Instructions (Signed)
You have been seen today for abdominal pain and vomiting. Please read and follow all provided instructions.   1. Medications: Prescription sent to your pharmacy for Reglan. This is for nausea and you can take as needed. Prescriptions sent to your pharmacy for Prilosec. You should take this medication daily for 2 weeks. This is a medication helps to reduce the acid in your stomach. If it helps your symptoms you can see a primary care doctor at the [facility] and they cn evaluate you to see if this is a medication you should continue taking. You can also take Carafate which is an over the counter medication. This medication is used to protect your stomach lining from acid, which can be contributing to your abdominal pain. Please take as directed. Also continue usual home medications 2. Treatment: rest, drink plenty of fluids. Eat a bland diet over the next few days to allow your stomach to rest.  3. Follow Up: Please follow up with your primary doctor in 1 week for discussion of your diagnoses and further evaluation after today's visit; if you do not have a primary care doctor use the resource guide provided to find one; Please return to the ER for any new or worsening symptoms. Please obtain all of your results from medical records or have your doctors office obtain the results - share them with your doctor - you should be seen at your doctors office. Call today to arrange your follow up.   Take medications as prescribed. Please review all of the medicines and only take them if you do not have an allergy to them. Return to the emergency room for worsening condition or new concerning symptoms. Follow up with your regular doctor. If you don't have a regular doctor use one of the numbers below to establish a primary care doctor.  ?  It is also a possibility that you have an allergic reaction to any of the medicines that you have been prescribed - Everybody reacts  differently to medications and while MOST people have no trouble with most medicines, you may have a reaction such as nausea, vomiting, rash, swelling, shortness of breath. If this is the case, please stop taking the medicine immediately and contact your physician.   Please seek immediate care if you have any develop any of the following symptoms: The pain does not go away.  You have a fever.  You keep throwing up (vomiting).  The pain is felt only in portions of the abdomen. Pain in the right side could possibly be appendicitis. In an adult, pain in the left lower portion of the abdomen could be colitis or diverticulitis.  You pass bloody or black tarry stools.  There is bright red blood in the stool.  The constipation stays for more than 4 days.  There is belly (abdominal) or rectal pain.  You do not seem to be getting better.  You have any questions or concerns.

## 2018-09-26 LAB — URINE CULTURE: Culture: NO GROWTH

## 2018-12-11 ENCOUNTER — Telehealth: Payer: Self-pay | Admitting: Cardiology

## 2018-12-11 NOTE — Telephone Encounter (Signed)
  Patient recently had a procedure done and he is c/o nausea, vomitting, bleeding nose and from the rectum and  blood in urine and vomit. He is experiencing some dizziness also. Patient would like an appt to see Dr Anne Fu in the office. Please call.

## 2018-12-11 NOTE — Telephone Encounter (Addendum)
Pt states he has not had any follow up since Mitral Valve repair by Dr Cornelius Moras January 2019. Pt states he has been having chest discomfort off and on for about 8 months. Pt states chest pain feels like tension, squeezing in his chest, 10 out of 10, lasts about 10 minutes at a time, no pattern, nothing seems to make it better. Pt states he is not sure if his chest pain is related to diverticulosis. Pt states he has been evaluated for blood in stool, had colonoscopy, was told it was okay, but has not had any follow up since he had colonoscopy. Pt advised he should contact his PCP/GI for follow up.  Pt states he is calling for an appointment with Dr Laqueshia Cihlar Fu because of ongoing chest discomfort and no follow up since Mitral Valve repair.

## 2018-12-11 NOTE — Telephone Encounter (Addendum)
Pt aware I will forward to Dr Pernell Dupre for review and recommendations about follow up given current scheduling guidelines during Covid-19 pandemic.

## 2018-12-12 NOTE — Telephone Encounter (Signed)
   TELEPHONE CALL NOTE  This patient has been deemed a candidate for follow-up tele-health visit to limit community exposure during the Covid-19 pandemic. I spoke with the patient via phone to discuss instructions. This has been outlined on the patient's AVS (dotphrase: [email or web address]). The patient was advised to review the section on consent for treatment as well. The patient will receive a phone call 2-3 days prior to their E-Visit at which time consent will be verbally confirmed. A Virtual Office Visit appointment type has been scheduled for  [facility] with Dr Anne Fu, with "VIDEO" or "TELEPHONE" in the appointment notes - patient prefers D[employer] (video) type.  I have either confirmed the patient is active in MyChart or offered to send sign-up link to phone/email via Mychart icon beside patient's photo.Yes  Verbal consent obtained from patient for visit.  Rocco Serene, RN 12/12/2018 4:28 PM

## 2018-12-12 NOTE — Telephone Encounter (Signed)
Let's get him in for a virtual visit with me. History of GI symptoms, vomiting, ER.  Donato Schultz, MD

## 2018-12-13 ENCOUNTER — Telehealth: Payer: Self-pay | Admitting: Cardiology

## 2018-12-13 NOTE — Telephone Encounter (Signed)
Spoke with Steven Montoya, he is now mychart activated

## 2018-12-14 ENCOUNTER — Other Ambulatory Visit: Payer: Self-pay

## 2018-12-14 ENCOUNTER — Telehealth: Payer: Self-pay

## 2018-12-14 ENCOUNTER — Encounter: Payer: Self-pay | Admitting: Cardiology

## 2018-12-14 ENCOUNTER — Telehealth (INDEPENDENT_AMBULATORY_CARE_PROVIDER_SITE_OTHER): Payer: Medicaid Other | Admitting: Cardiology

## 2018-12-14 VITALS — Ht 72.0 in | Wt 158.0 lb

## 2018-12-14 DIAGNOSIS — R079 Chest pain, unspecified: Secondary | ICD-10-CM

## 2018-12-14 DIAGNOSIS — R0789 Other chest pain: Secondary | ICD-10-CM | POA: Diagnosis not present

## 2018-12-14 DIAGNOSIS — Z9889 Other specified postprocedural states: Secondary | ICD-10-CM

## 2018-12-14 DIAGNOSIS — R1013 Epigastric pain: Secondary | ICD-10-CM

## 2018-12-14 MED ORDER — OMEPRAZOLE 20 MG PO CPDR: 20.0000 mg | DELAYED_RELEASE_CAPSULE | Freq: Every day | ORAL | Status: DC

## 2018-12-14 NOTE — Progress Notes (Signed)
Virtual Visit via Video Note   This visit type was conducted due to national recommendations for restrictions regarding the COVID-19 Pandemic (e.g. social distancing) in an effort to limit this patient's exposure and mitigate transmission in our community.  Due to his co-morbid illnesses, this patient is at least at moderate risk for complications without adequate follow up.  This format is felt to be most appropriate for this patient at this time.  All issues noted in this document were discussed and addressed.  A limited physical exam was performed with this format.  Please refer to the patient's chart for his consent to telehealth for [facility].   Evaluation Performed:  Follow-up visit  Date:  12/14/2018   ID:  Steven Borer., DOB 25-Feb-1976, MRN [id number]  Patient Location: Home Provider Location: Home  PCP:  Patient, No Pcp Per  Cardiologist:  Donato Schultz, MD  Electrophysiologist:  None   Chief Complaint: Follow-up mitral valve repair, chest pain  History of Present Illness:    Steven Dragonetti. is a 43 y.o. male with mitral valve repair 08/2017 who was last in the emergency department in January 2020 with abdominal pain and emesis over 4 days with extreme pain, CT was negative here for follow-up.  He is been lost to follow-up until now from cardiology perspective.  Surgical repair went well.  While in the emergency department he did state that he had intermittent bleeding of his rectum and a rectal exam was attempted but he declined stating that he would follow-up with me.  CP for 8 months, tension, 10/10, 10 min. At a time. Nothing makes it better.  Sometimes it may feel better when he lays flat on his back.  Sometimes he feels as though it is hard to take in a deep breath because of the discomfort but this is not all the time.  He has been having vomiting periodically, sometimes produces what looks like blood.  Once again, ER CT was unremarkable.  No fevers. Takes so  much tylenol he states. Tried Zofran maybe helped.   Was advised to follow up with GI or PCP previously.   Has had issues with anxiety, pain management in the past.  Refused blood draws until he can receive pain medication.  He states I've been better.  Walks around his pond 3 times every day.  The patient does not have symptoms concerning for COVID-19 infection (fever, chills, cough, or new shortness of breath).    Past Medical History:  Diagnosis Date  . Anxiety   . Chronic urethral stricture   . Depression   . Diverticulitis   . Frequency of urination   . Headache(784.0)   . Lower abdominal pain   . Mitral valve prolapse   . Nocturia   . 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 Urgency of urination   . Urinary straining    Past Surgical History:  Procedure Laterality Date  . COLONOSCOPY N/A 08/17/2013   Procedure: COLONOSCOPY;  Surgeon: Romie Levee, MD;  Location: [facility] ENDOSCOPY;  Service: Endoscopy;  Laterality: N/A;  . CYSTO/ RETROGRADE PYELOGRAM/ URETHRAL DILATION  11-05-2010  . MITRAL VALVE REPAIR Right 09/07/2017   Procedure: MINIMALLY INVASIVE MITRAL VALVE REPAIR (MVR) using Sorin Memo 3D Ring size 34;  Surgeon: Purcell Nails, MD;  Location: [facility] OR;  Service: Open Heart Surgery;  Laterality: Right;  . RIGHT/LEFT HEART CATH AND CORONARY ANGIOGRAPHY N/A 07/22/2017   Procedure: RIGHT/LEFT HEART CATH AND CORONARY ANGIOGRAPHY;  Surgeon: Tonny Bollmanooper, Michael, MD;  [facility];  Service: Cardiovascular;  Laterality: N/A;  . TEE WITHOUT CARDIOVERSION N/A 06/28/2017   Procedure: TRANSESOPHAGEAL ECHOCARDIOGRAM (TEE);  Surgeon: Jake BatheSkains, Mclain Freer C, MD;  [facility];  Service: Cardiovascular;  Laterality: N/A;  . TEE WITHOUT CARDIOVERSION N/A 09/07/2017   Procedure:  TRANSESOPHAGEAL ECHOCARDIOGRAM (TEE);  Surgeon: Purcell Nailswen, Clarence H, MD;  [facility];  Service: Open Heart Surgery;  Laterality: N/A;  . TOOTH EXTRACTION     canines     Current Meds  Medication Sig  . acetaminophen (TYLENOL) 500 MG tablet Take 1,000 mg by mouth every 6 (six) hours as needed for headache (pain).  Marland Kitchen. aspirin EC 81 MG EC tablet Take 1 tablet (81 mg total) by mouth daily.     Allergies:   Latex and Adhesive [tape]   Social History   Tobacco Use  . Smoking status: Current Every Day Smoker    Packs/day: 0.50    Years: 4.00    Pack years: 2.00    Types: Cigarettes  . Smokeless tobacco: Current User    Types: Chew, Snuff  Substance Use Topics  . 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 Drug use: No    Types: Marijuana, "Crack" cocaine, Methamphetamines    Comment: pt is in jail     Family Hx: The patient's family history includes Heart disease in his father.  ROS:   Please see the history of present illness.    No syncope, no orthopnea All other systems reviewed and are negative.   Prior CV studies:   The following studies were reviewed today:  Heart catheterization reviewed, no angiographically significant CAD.  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  Discussed this with Dr. Excell Seltzerooper.  Labs/Other Tests and Data Reviewed:    EKG:  An ECG dated 09/24/17 was personally reviewed today and demonstrated:  SR, subtle J-point elevation, otherwise unremarkable  Recent Labs: 09/24/2018: ALT 14; BUN 8; Creatinine, Ser 0.93; Hemoglobin 13.3; Platelets 367; Potassium 3.9; Sodium 137   Recent Lipid Panel No results found for: CHOL, TRIG, HDL, CHOLHDL, LDLCALC, LDLDIRECT  Wt Readings from Last 3 Encounters:  12/14/18 158 lb (71.7 kg)  09/24/18 140 lb (63.5 kg)  09/12/17 142 lb 1.6 oz (64.5 kg)     Objective:    Vital Signs:  Ht 6'  (1.829 m)   Wt 158 lb (71.7 kg)   BMI 21.43 kg/m    Well nourished, well developed male in no acute distress. Multiple tattoos noted.  Able to complete full sentences without difficulty.  ASSESSMENT & PLAN:    Mitral valve repair  - Dr. Cornelius Moraswen, mini.  -In reviewing prior progress note from Dr. Cornelius Moraswen post surgery on 09/10/2017 he stated that he did not sleep because he was in a lot of pain. -We will check an echocardiogram, he has not had one postoperatively.  Chest pain/epigastric pain/abdominal pain - Ongoing for 8 months with no associated fevers.  Prior abdominal CT was unremarkable. - Possibly MSK, possibly GI.  Echocardiogram will be helpful to ensure that he does not have any evidence of pericardial effusion although this was not seen on  chest/abdominal CT within this 54-month timeframe.  Would be an unusual pattern for angina. - Encouraged him to use over-the-counter Prilosec. - We will also refer him to GI to see if there are any other suggestions.  We will follow-up with studies.   COVID-19 Education: The signs and symptoms of COVID-19 were discussed with the patient and how to seek care for testing (follow up with PCP or arrange E-visit).  The importance of social distancing was discussed today.  Time:   Today, I have spent 25 minutes with the patient with telehealth technology discussing the above problems.     Medication Adjustments/Labs and Tests Ordered: Current medicines are reviewed at length with the patient today.  Concerns regarding medicines are outlined above.   Tests Ordered: Orders Placed This Encounter  Procedures  . Ambulatory referral to Gastroenterology  . ECHOCARDIOGRAM COMPLETE    Medication Changes: Meds ordered this encounter  Medications  . omeprazole (PRILOSEC) 20 MG capsule    Sig: Take 1 capsule (20 mg total) by mouth daily.    Disposition:  Follow up with studies  Signed, Donato Schultz, MD  12/14/2018 4:22 PM    [facility]

## 2018-12-14 NOTE — Patient Instructions (Signed)
Medication Instructions:  Please start Prilosec 20 mg by mouth once daily.  You can purchase this over the counter at any pharmacy. Continue all other medications as listed.  If you need a refill on your cardiac medications before your next appointment, please call your pharmacy.   Testing/Procedures: Your physician has requested that you have an echocardiogram.  You will be called to schedule this appointment and it will be completed at [address], [place], [place] [address].   Echocardiography is a painless test that uses sound waves to create images of your heart. It provides your doctor with information about the size and shape of your heart and how well your heart's chambers and valves are working. This procedure takes approximately one hour. There are no restrictions for this procedure.  You are being referred to Gastroenterology for the evaluation of abdominal - epigastric discomfort.  You will be contact by phone to schedule this appointment.  Follow-Up: Follow up with Dr Anne Fu will be determined after the above testing.  Thank you for choosing [facility]!!

## 2018-12-14 NOTE — Telephone Encounter (Signed)
Virtual Visit Pre-Appointment Phone Call  Steps For Call:  1. Confirm consent - "In the setting of the current Covid19 crisis, you are scheduled for a (phone or video) visit with your provider on (date) at (time).  Just as we do with many in-office visits, in order for you to participate in this visit, we must obtain consent.  If you'd like, I can send this to your mychart (if signed up) or email for you to review.  Otherwise, I can obtain your verbal consent now.  All virtual visits are billed to your insurance company just like a normal visit would be.  By agreeing to a virtual visit, we'd like you to understand that the technology does not allow for your provider to perform an examination, and thus may limit your provider's ability to fully assess your condition.  Finally, though the technology is pretty good, we cannot assure that it will always work on either your or our end, and in the setting of a video visit, we may have to convert it to a phone-only visit.  In either situation, we cannot ensure that we have a secure connection.  Are you willing to proceed?" YES  2. Confirm the BEST phone number to call the day of the visit by including in appointment notes  3. Give patient instructions for WebEx/MyChart download to smartphone as below or Doximity/Doxy.me if video visit (depending on what platform provider is using)  4. Advise patient to be prepared with their blood pressure, heart rate, weight, any heart rhythm information, their current medicines, and a piece of paper and pen handy for any instructions they may receive the day of their visit  5. Inform patient they will receive a phone call 15 minutes prior to their appointment time (may be from unknown caller ID) so they should be prepared to answer  6. Confirm that appointment type is correct in Epic appointment notes (VIDEO vs PHONE)     TELEPHONE CALL NOTE  Steven R Fiorello Jr. has been deemed a candidate for a follow-up  tele-health visit to limit community exposure during the Covid-19 pandemic. I spoke with the patient via phone to ensure availability of phone/video source, confirm preferred email & phone number, and discuss instructions and expectations.  I reminded [name]. to be prepared with any vital sign and/or heart rhythm information that could potentially be obtained via home monitoring, at the time of his visit. I reminded [name]. to expect a phone call at the time of his visit if his visit.  Perrin Smack, [place] 12/14/2018 2:07 PM   INSTRUCTIONS FOR DOWNLOADING THE WEBEX APP TO SMARTPHONE  - If Apple, ask patient to go to [employer] and type in WebEx in the search bar. Download [employer], the blue/green circle. If Android, go to [employer] and type in [employer] in the search bar. The app is free but as with any other app downloads, their phone may require them to verify saved payment information or Apple/Android password.  - The patient does NOT have to create an account. - On the day of the visit, the assist will walk the patient through joining the meeting with the meeting number/password.  INSTRUCTIONS FOR DOWNLOADING THE MYCHART APP TO SMARTPHONE  - The patient must first make sure to have activated MyChart and know their login information - If Apple, go to [employer] and type in MyChart in the search bar and download the  app. If Android, ask patient to go to [employer] Play Store and type in [facility] in the search bar and download the app. The app is free but as with any other app downloads, their phone may require them to verify saved payment information or Apple/Android password.  - The patient will need to then log into the app with their MyChart username and password, and select [facility] as their healthcare provider to link the account. When it is time for your visit, go to the MyChart app, find appointments, and click Begin Video Visit. Be sure to Select  Allow for your device to access the Microphone and Camera for your visit. You will then be connected, and your provider will be with you shortly.  **If they have any issues connecting, or need assistance please contact MyChart service desk [phone])**  **If using a computer, in order to ensure the best quality for their visit they will need to use either of the following Internet Browsers: [employer] Edge, or Google Chrome**  IF USING DOXIMITY or DOXY.ME - The patient will receive a link just prior to their visit, either by text or email (to be determined day of appointment depending on if it's doxy.me or Doximity).     FULL LENGTH CONSENT FOR TELE-HEALTH VISIT   I hereby voluntarily request, consent and authorize [facility] and its employed or contracted physicians, physician assistants, nurse practitioners or other licensed health care professionals (the Practitioner), to provide me with telemedicine health care services (the "Services") as deemed necessary by the treating Practitioner. I acknowledge and consent to receive the Services by the Practitioner via telemedicine. I understand that the telemedicine visit will involve communicating with the Practitioner through live audiovisual communication technology and the disclosure of certain medical information by electronic transmission. I acknowledge that I have been given the opportunity to request an in-person assessment or other available alternative prior to the telemedicine visit and am voluntarily participating in the telemedicine visit.  I understand that I have the right to withhold or withdraw my consent to the use of telemedicine in the course of my care at any time, without affecting my right to future care or treatment, and that the Practitioner or I may terminate the telemedicine visit at any time. I understand that I have the right to inspect all information obtained and/or recorded in the course of the telemedicine  visit and may receive copies of available information for a reasonable fee.  I understand that some of the potential risks of receiving the Services via telemedicine include:  Marland Kitchen. Delay or interruption in medical evaluation due to technological equipment failure or disruption; . Information transmitted may not be sufficient (e.g. poor resolution of images) to allow for appropriate medical decision making by the Practitioner; and/or  . In rare instances, security protocols could fail, causing a breach of personal health information.  Furthermore, I acknowledge that it is my responsibility to provide information about my medical history, conditions and care that is complete and accurate to the best of my ability. I acknowledge that Practitioner's advice, recommendations, and/or decision may be based on factors not within their control, such as incomplete or inaccurate data provided by me or distortions of diagnostic images or specimens that may result from electronic transmissions. I understand that the practice of medicine is not an exact science and that Practitioner makes no warranties or guarantees regarding treatment outcomes. I acknowledge that I will receive a copy of this consent concurrently upon execution via email  to the email address I last provided but may also request a printed copy by calling the office of [facility].    I understand that my insurance will be billed for this visit.   I have read or had this consent read to me. . I understand the contents of this consent, which adequately explains the benefits and risks of the Services being provided via telemedicine.  . I have been provided ample opportunity to ask questions regarding this consent and the Services and have had my questions answered to my satisfaction. . I give my informed consent for the services to be provided through the use of telemedicine in my medical care  By participating in this telemedicine visit I agree to the  above.

## 2018-12-18 ENCOUNTER — Ambulatory Visit (INDEPENDENT_AMBULATORY_CARE_PROVIDER_SITE_OTHER): Payer: Medicaid Other | Admitting: Gastroenterology

## 2018-12-18 ENCOUNTER — Other Ambulatory Visit: Payer: Self-pay

## 2018-12-18 ENCOUNTER — Encounter: Payer: Self-pay | Admitting: Gastroenterology

## 2018-12-18 DIAGNOSIS — R0789 Other chest pain: Secondary | ICD-10-CM

## 2018-12-18 DIAGNOSIS — K5909 Other constipation: Secondary | ICD-10-CM

## 2018-12-18 DIAGNOSIS — K648 Other hemorrhoids: Secondary | ICD-10-CM

## 2018-12-18 NOTE — Progress Notes (Signed)
This patient contacted our office requesting a physician telemedicine video consultation regarding clinical questions and/or test results.  If new patient, they were referred by Candee Furbish, MD (Cardiology)  Participants on the Zoom : myself and patient   The patient consented to phone consultation and was aware that a charge will be placed through their insurance.  I was in my office and the patient was at home   Encounter time:  Total time 45 minutes, with 30 minutes spent with patient on videoconference. (in addition to record review, patient required assistance with tech support for videoconference)  Wilfrid Lund, MD   _____________________________________________________________________________________________              [facility] Consult Note:  History: Valora Piccolo. 12/18/2018  Referring physician: Candee Furbish, MD  Reason for consult/chief complaint: abdominal pain  Subjective  HPI:  In the [facility] ED January 26 with several days of lower abdominal pain and vomiting with eventual blood-streaked emesis.  He was apparently describing some rectal bleeding during that ED visit, but declined a rectal exam.  He followed up by telemedicine with his cardiologist, Dr. Marlou Porch, on 12/14/2018, and described ongoing chest pain, epigastric abdominal pain.  Cardiologist did not feel this was angina (and also described prior issues of chronic pain and med-seeking behavior)    About 8 months - states bleeding from nose, vomit, rectal, urine. BM every 1-2 weeks Rectal bleeding occurs twice/week.  Had colonoscopy in hospital Dr. Leighton Ruff Dec 9458 for diarrhea, abd pain and bleeding - complete exam to TI,  Int HR seen. Says he woke up from colonoscopy in severe pain.(though had MAC sedation)  States he has used multiple OTC therapies.  Hasnt seen urology.   Reports chest,generalized abd pain.  Says so bad he can't breathe. Worse with movement,  breathing.  No PCP  ROS:  Review of Systems  Constitutional: Negative for appetite change and unexpected weight change.  HENT: Negative for mouth sores and voice change.   Eyes: Negative for pain and redness.  Respiratory: Negative for cough and shortness of breath.   Cardiovascular: Negative for chest pain and palpitations.  Genitourinary: Negative for dysuria and hematuria.  Musculoskeletal: Negative for arthralgias and myalgias.  Skin: Negative for pallor and rash.  Neurological: Negative for weakness and headaches.  Hematological: Negative for adenopathy.    Scattered musculoskeletal pain  No weight loss  Remainder of systems negative except as above  Past Medical History: Past Medical History:  Diagnosis Date  . Anxiety   . Chronic urethral stricture   . Depression   . Diverticulitis   . Frequency of urination   . Headache(784.0)   . Lower abdominal pain   . Mitral valve prolapse   . Nocturia   . 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 Urgency of urination   . Urinary straining      Past Surgical History: Past Surgical History:  Procedure Laterality Date  . COLONOSCOPY N/A 08/17/2013   Procedure: COLONOSCOPY;  Surgeon: Leighton Ruff, MD;  Location: [facility];  Service: Endoscopy;  Laterality: N/A;  . CYSTO/ RETROGRADE PYELOGRAM/ URETHRAL DILATION  11-05-2010  . MITRAL VALVE REPAIR Right 09/07/2017   Procedure: MINIMALLY INVASIVE MITRAL VALVE REPAIR (MVR) using Sorin Memo 3D Ring size 34;  Surgeon: Rexene Alberts, MD;  Location: [facility]  OR;  Service: Open Heart Surgery;  Laterality: Right;  . RIGHT/LEFT HEART CATH AND CORONARY ANGIOGRAPHY N/A 07/22/2017   Procedure: RIGHT/LEFT HEART CATH AND CORONARY ANGIOGRAPHY;  Surgeon: Sherren Mocha, MD;  Location: [place] [facility];  Service: Cardiovascular;  Laterality: N/A;  . TEE WITHOUT CARDIOVERSION N/A 06/28/2017   Procedure: TRANSESOPHAGEAL ECHOCARDIOGRAM (TEE);  Surgeon: Jerline Pain, MD;  Location: [facility];  Service: Cardiovascular;  Laterality: N/A;  . TEE WITHOUT CARDIOVERSION N/A 09/07/2017   Procedure: TRANSESOPHAGEAL ECHOCARDIOGRAM (TEE);  Surgeon: Rexene Alberts, MD;  Location: [facility];  Service: Open Heart Surgery;  Laterality: N/A;  . TOOTH EXTRACTION     canines   Mitral valve repair January 2019  Family History: Family History  Problem Relation Age of Onset  . Heart disease Father     Social History: Social History   Socioeconomic History  . Marital status: Married    Spouse name: Not on file  . Number of children: Not on file  . Years of education: Not on file  . Highest education level: Not on file  Occupational History  . Not on file  Social Needs  . Financial resource strain: Not on file  . Food insecurity:    Worry: Not on file    Inability: Not on file  . Transportation needs:    Medical: Not on file    Non-medical: Not on file  Tobacco Use  . Smoking status: Current Every Day Smoker    Packs/day: 0.50    Years: 4.00    Pack years: 2.00    Types: Cigarettes  . Smokeless tobacco: Current User    Types: Chew, Snuff  Substance and Sexual Activity  . 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 Drug use: No    Types: Marijuana, "Crack" cocaine, Methamphetamines    Comment: pt is in jail  . Sexual activity: Not on file  Lifestyle  . Physical activity:    Days per week: Not on file    Minutes per session: Not on file  . Stress: Not on file  Relationships  . Social connections:    Talks on phone: Not on file    Gets together: Not on file    Attends religious service: Not on file    Active member of club or organization: Not on file    Attends meetings of clubs or  organizations: Not on file    Relationship status: Not on file  Other Topics Concern  . Not on file  Social History Narrative  . Not on file   He reports 4-5 yrs without illicit drugs No EtOH  Allergies: Allergies  Allergen Reactions  . Latex Other (See Comments)    HX SUPERFICIAL THROMBOPHLEBITIS LEFT FOREARM AFTER IV IN APR 2011  . Adhesive [Tape] Rash    Outpatient Meds: Current Outpatient Medications  Medication Sig Dispense Refill  . acetaminophen (TYLENOL) 500 MG tablet Take 1,000 mg by mouth every 6 (six) hours as needed for headache (pain).    Marland Kitchen aspirin EC 81 MG EC tablet Take 1 tablet (81 mg total) by mouth daily.    . metoCLOPramide (REGLAN) 10 MG tablet Take 1 tablet (10 mg total) by mouth every 8 (eight) hours as needed for nausea. (Patient not taking: Reported on 12/14/2018) 8 tablet 0  . omeprazole (PRILOSEC) 20 MG capsule Take 1 capsule (  20 mg total) by mouth daily.     No current facility-administered medications for this visit.    Says he takes no meds  Previously took NSAIDs/goody powder  Takes tylenol "like candy"    ___________________________________________________________________ Objective   Exam:   Labs:  CBC Latest Ref Rng & Units 09/24/2018 11/08/2017 09/09/2017  WBC 4.0 - 10.5 K/uL 9.7 8.7 14.8(H)  Hemoglobin 13.0 - 17.0 g/dL 13.3 13.8 10.3(L)  Hematocrit 39.0 - 52.0 % 41.2 41.1 31.4(L)  Platelets 150 - 400 K/uL 367 349 149(L)   CMP Latest Ref Rng & Units 09/24/2018 11/08/2017 09/10/2017  Glucose 70 - 99 mg/dL 101(H) 91 169(H)  BUN 6 - 20 mg/dL _0 Creatinine 0.61 - 1.24 mg/dL 0.93 0.84 0.81  Sodium 135 - 145 mmol/L 137 138 136  Potassium 3.5 - 5.1 mmol/L 3.9 4.0 4.1  Chloride 98 - 111 mmol/L 99 105 103  CO2 22 - 32 mmol/L _1 Calcium 8.9 - 10.3 mg/dL 9.5 9.3 8.6(L)  Total Protein 6.5 - 8.1 g/dL 6.1(L) 6.5 -  Total Bilirubin 0.3 - 1.2 mg/dL 0.5 0.3 -  Alkaline Phos 38 - 126 U/L 50 79 -  AST 15 - 41 U/L 19 24 -  ALT 0 - 44 U/L  14 16(L) -     Radiologic Studies:  CT abdomen and pelvis January 26 no acute findings in abdomen.  Assessment: Encounter Diagnoses  Name Primary?  . Other chest pain Yes  . Chronic constipation   . Hemorrhoids, internal, with bleeding    Chronic constipation causing rectal bleeding in setting of chronic pain syndrome. Chest pain sounds musculoskeletal.  Plan:  Rec mag citrate for initial relief obstipation , followed my one capful miralax powder 2-3 times daily.  Preparation H suppository for hemorrhoidal bleeding.  Will have my office staff contact him for an appointment to see me in about 6 weeks, at which time we will hopefully have been able to lift some COVID related restrictions.  Thank you for the courtesy of this consult.  Please call me with any questions or concerns.  Nelida Meuse III  CC: Referring provider noted above

## 2018-12-25 ENCOUNTER — Ambulatory Visit (HOSPITAL_COMMUNITY): Payer: Medicaid Other

## 2019-02-21 ENCOUNTER — Telehealth (HOSPITAL_COMMUNITY): Payer: Self-pay | Admitting: *Deleted

## 2019-02-21 NOTE — Telephone Encounter (Signed)
Left message about echo and asked patient to call office for COVID screening.  

## 2019-02-22 ENCOUNTER — Other Ambulatory Visit (HOSPITAL_COMMUNITY): Payer: Medicaid Other

## 2019-04-15 IMAGING — DX DG CHEST 2V
2 series · 2 of 2 positions shown · non-contrast
Comparison: 03/24/2017

CLINICAL DATA: Syncope, dyspnea and chest pain today. Currently
lethargic and amnestic. May have struck head in the shower.

EXAM:
CHEST  2 VIEW

[w chest lat]
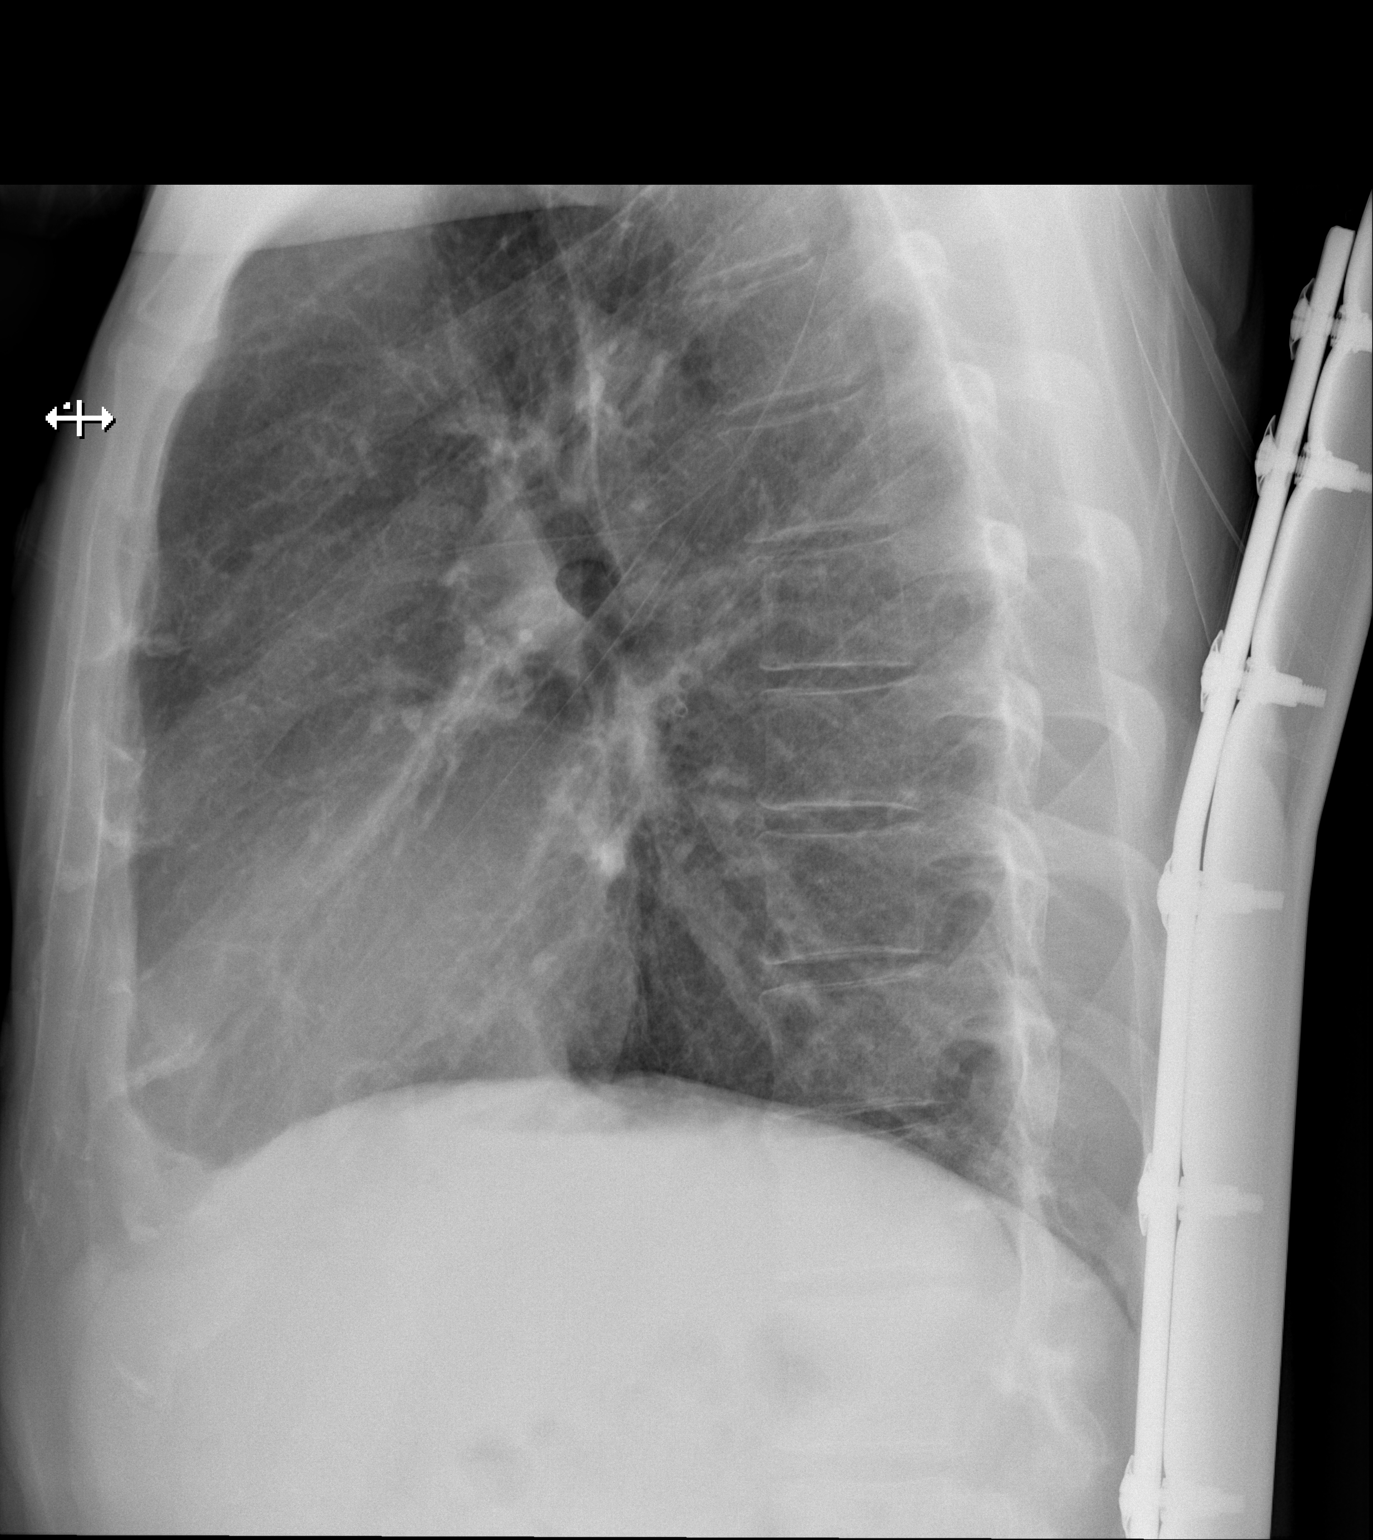

[x chest ap]
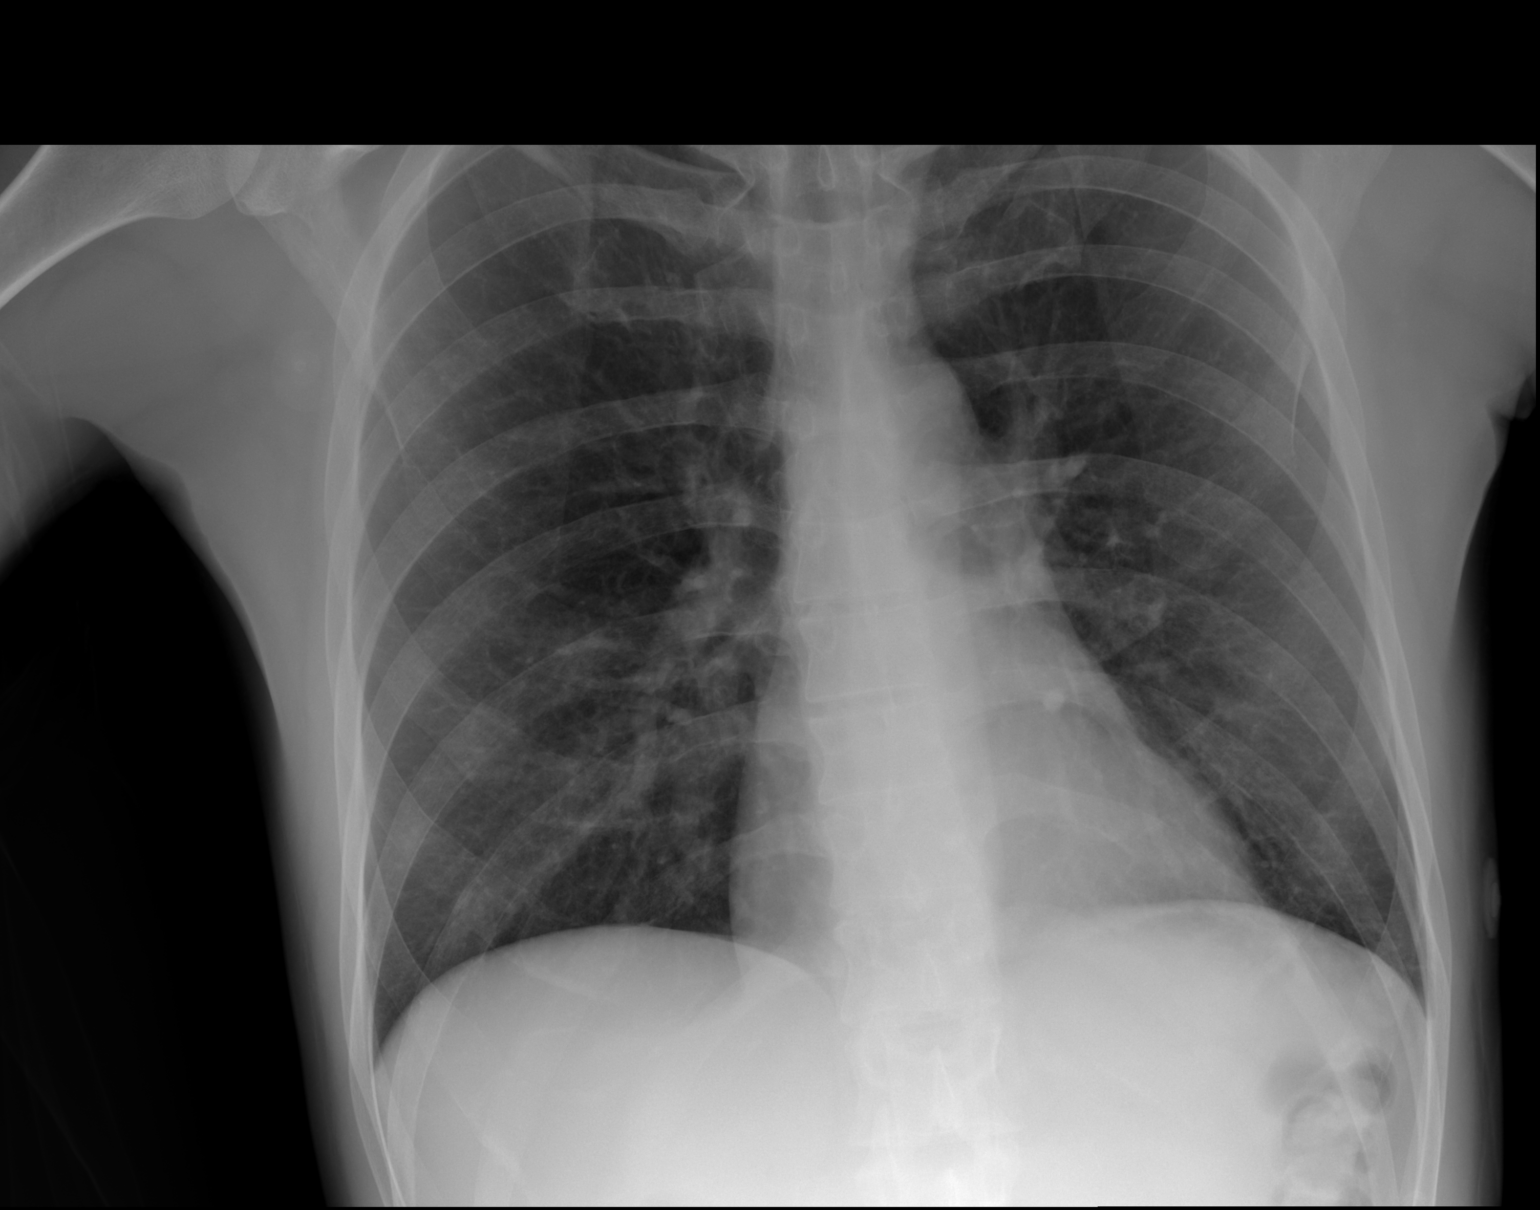

[2 of 2 positions shown; findings below may reference images not displayed]

FINDINGS: The lungs are clear. The pulmonary vasculature is normal. Heart size
is normal. Hilar and mediastinal contours are unremarkable. There is
no pleural effusion.
IMPRESSION: No active cardiopulmonary disease.

## 2019-05-21 ENCOUNTER — Encounter (HOSPITAL_COMMUNITY): Payer: Self-pay

## 2019-05-21 ENCOUNTER — Other Ambulatory Visit: Payer: Self-pay

## 2019-05-21 ENCOUNTER — Emergency Department (HOSPITAL_COMMUNITY): Payer: Medicaid Other

## 2019-05-21 ENCOUNTER — Emergency Department (HOSPITAL_COMMUNITY)
Admission: EM | Admit: 2019-05-21 | Discharge: 2019-05-22 | Disposition: A | Discharge status: Home / Self Care | Payer: Medicaid Other | Attending: Emergency Medicine | Admitting: Emergency Medicine

## 2019-05-21 DIAGNOSIS — Z5321 Procedure and treatment not carried out due to patient leaving prior to being seen by health care provider: Secondary | ICD-10-CM | POA: Insufficient documentation

## 2019-05-21 DIAGNOSIS — R079 Chest pain, unspecified: Secondary | ICD-10-CM | POA: Diagnosis present

## 2019-05-21 DIAGNOSIS — R42 Dizziness and giddiness: Secondary | ICD-10-CM | POA: Insufficient documentation

## 2019-05-21 DIAGNOSIS — R112 Nausea with vomiting, unspecified: Secondary | ICD-10-CM | POA: Insufficient documentation

## 2019-05-21 DIAGNOSIS — R55 Syncope and collapse: Secondary | ICD-10-CM | POA: Insufficient documentation

## 2019-05-21 LAB — BASIC METABOLIC PANEL
Anion gap: 12 (ref 5–15)
BUN: 13 mg/dL (ref 6–20)
CO2: 24 mmol/L (ref 22–32)
Calcium: 9.9 mg/dL (ref 8.9–10.3)
Chloride: 101 mmol/L (ref 98–111)
Creatinine, Ser: 1.25 mg/dL — ABNORMAL HIGH (ref 0.61–1.24)
GFR calc Af Amer: >60 mL/min (ref >60)
GFR calc non Af Amer: >60 mL/min (ref >60)
Glucose, Bld: 99 mg/dL (ref 70–99)
Potassium: 3.9 mmol/L (ref 3.5–5.1)
Sodium: 137 mmol/L (ref 135–145)

## 2019-05-21 LAB — CBC
HCT: 47.3 % (ref 39.0–52.0)
Hemoglobin: 16.7 g/dL (ref 13.0–17.0)
MCH: 31.1 pg (ref 26.0–34.0)
MCHC: 35.3 g/dL (ref 30.0–36.0)
MCV: 88.1 fL (ref 80.0–100.0)
Platelets: 340 10*3/uL (ref 150–400)
RBC: 5.37 MIL/uL (ref 4.22–5.81)
RDW: 15 % (ref 11.5–15.5)
WBC: 9.1 10*3/uL (ref 4.0–10.5)
nRBC: 0 % (ref 0.0–0.2)

## 2019-05-21 LAB — TROPONIN I (HIGH SENSITIVITY): Troponin I (High Sensitivity): 5 ng/L (ref <18)

## 2019-05-21 MED ORDER — SODIUM CHLORIDE 0.9% FLUSH: 3.0000 mL | Freq: Once | INTRAVENOUS | Status: DC

## 2019-05-21 NOTE — ED Triage Notes (Signed)
Pt reports central sharp chest pain since this morning also reports n.v, dizziness. States his wife told him he passed out earlier today but he does not remember. Pt wincing in pain in triage. Hx of heart surgery in jan of 2019.

## 2019-05-22 NOTE — ED Notes (Signed)
PT called for vitals 3X with no response. PT not seen in lobby.

## 2020-01-03 ENCOUNTER — Emergency Department (HOSPITAL_COMMUNITY): Payer: Medicaid Other

## 2020-01-03 ENCOUNTER — Encounter (HOSPITAL_COMMUNITY): Payer: Self-pay | Admitting: *Deleted

## 2020-01-03 ENCOUNTER — Emergency Department (HOSPITAL_COMMUNITY)
Admission: EM | Admit: 2020-01-03 | Discharge: 2020-01-03 | Disposition: A | Discharge status: Home / Self Care | Payer: Medicaid Other | Attending: Emergency Medicine | Admitting: Emergency Medicine

## 2020-01-03 ENCOUNTER — Other Ambulatory Visit: Payer: Self-pay

## 2020-01-03 DIAGNOSIS — Y929 Unspecified place or not applicable: Secondary | ICD-10-CM | POA: Insufficient documentation

## 2020-01-03 DIAGNOSIS — S0083XA Contusion of other part of head, initial encounter: Secondary | ICD-10-CM | POA: Insufficient documentation

## 2020-01-03 DIAGNOSIS — M79651 Pain in right thigh: Secondary | ICD-10-CM | POA: Insufficient documentation

## 2020-01-03 DIAGNOSIS — Y999 Unspecified external cause status: Secondary | ICD-10-CM | POA: Insufficient documentation

## 2020-01-03 DIAGNOSIS — R519 Headache, unspecified: Secondary | ICD-10-CM | POA: Insufficient documentation

## 2020-01-03 DIAGNOSIS — Y9389 Activity, other specified: Secondary | ICD-10-CM | POA: Diagnosis not present

## 2020-01-03 DIAGNOSIS — M25531 Pain in right wrist: Secondary | ICD-10-CM | POA: Insufficient documentation

## 2020-01-03 DIAGNOSIS — M546 Pain in thoracic spine: Secondary | ICD-10-CM | POA: Diagnosis not present

## 2020-01-03 DIAGNOSIS — R1084 Generalized abdominal pain: Secondary | ICD-10-CM | POA: Diagnosis not present

## 2020-01-03 DIAGNOSIS — M542 Cervicalgia: Secondary | ICD-10-CM | POA: Insufficient documentation

## 2020-01-03 DIAGNOSIS — R0789 Other chest pain: Secondary | ICD-10-CM | POA: Diagnosis not present

## 2020-01-03 DIAGNOSIS — M7918 Myalgia, other site: Secondary | ICD-10-CM | POA: Diagnosis present

## 2020-01-03 DIAGNOSIS — Z720 Tobacco use: Secondary | ICD-10-CM | POA: Insufficient documentation

## 2020-01-03 HISTORY — DX: Post-traumatic stress disorder, unspecified: F43.10

## 2020-01-03 HISTORY — DX: Attention-deficit hyperactivity disorder, unspecified type: F90.9

## 2020-01-03 LAB — SAMPLE TO BLOOD BANK

## 2020-01-03 LAB — COMPREHENSIVE METABOLIC PANEL
ALT: 59 U/L — ABNORMAL HIGH (ref 0–44)
AST: 81 U/L — ABNORMAL HIGH (ref 15–41)
Albumin: 3.7 g/dL (ref 3.5–5.0)
Alkaline Phosphatase: 54 U/L (ref 38–126)
Anion gap: 12 (ref 5–15)
BUN: 12 mg/dL (ref 6–20)
CO2: 23 mmol/L (ref 22–32)
Calcium: 9.4 mg/dL (ref 8.9–10.3)
Chloride: 105 mmol/L (ref 98–111)
Creatinine, Ser: 1.06 mg/dL (ref 0.61–1.24)
GFR calc Af Amer: >60 mL/min (ref >60)
GFR calc non Af Amer: >60 mL/min (ref >60)
Glucose, Bld: 82 mg/dL (ref 70–99)
Potassium: 3.8 mmol/L (ref 3.5–5.1)
Sodium: 140 mmol/L (ref 135–145)
Total Bilirubin: 0.9 mg/dL (ref 0.3–1.2)
Total Protein: 6.3 g/dL — ABNORMAL LOW (ref 6.5–8.1)

## 2020-01-03 LAB — I-STAT CHEM 8, ED
BUN: 12 mg/dL (ref 6–20)
Calcium, Ion: 1.14 mmol/L — ABNORMAL LOW (ref 1.15–1.40)
Chloride: 105 mmol/L (ref 98–111)
Creatinine, Ser: 1 mg/dL (ref 0.61–1.24)
Glucose, Bld: 75 mg/dL (ref 70–99)
HCT: 43 % (ref 39.0–52.0)
Hemoglobin: 14.6 g/dL (ref 13.0–17.0)
Potassium: 3.6 mmol/L (ref 3.5–5.1)
Sodium: 140 mmol/L (ref 135–145)
TCO2: 25 mmol/L (ref 22–32)

## 2020-01-03 LAB — ETHANOL: Alcohol, Ethyl (B): <10 mg/dL (ref <10)

## 2020-01-03 LAB — PROTIME-INR
INR: 1 (ref 0.8–1.2)
Prothrombin Time: 12.8 seconds (ref 11.4–15.2)

## 2020-01-03 LAB — CBC
HCT: 40.8 % (ref 39.0–52.0)
Hemoglobin: 14.1 g/dL (ref 13.0–17.0)
MCH: 30.7 pg (ref 26.0–34.0)
MCHC: 34.6 g/dL (ref 30.0–36.0)
MCV: 88.7 fL (ref 80.0–100.0)
Platelets: 315 10*3/uL (ref 150–400)
RBC: 4.6 MIL/uL (ref 4.22–5.81)
RDW: 14.1 % (ref 11.5–15.5)
WBC: 10.7 10*3/uL — ABNORMAL HIGH (ref 4.0–10.5)
nRBC: 0 % (ref 0.0–0.2)

## 2020-01-03 LAB — CDS SEROLOGY

## 2020-01-03 LAB — LACTIC ACID, PLASMA: Lactic Acid, Venous: 0.8 mmol/L (ref 0.5–1.9)

## 2020-01-03 MED ORDER — IOHEXOL 300 MG/ML  SOLN
100.0000 mL | Freq: Once | INTRAMUSCULAR | Status: AC | PRN
  Administered 2020-01-03: 100 mL via INTRAVENOUS

## 2020-01-03 MED ORDER — SODIUM CHLORIDE 0.9 % IV SOLN: INTRAVENOUS | Status: DC

## 2020-01-03 MED ORDER — KETOROLAC TROMETHAMINE 15 MG/ML IJ SOLN
15.0000 mg | Freq: Once | INTRAMUSCULAR | Status: AC
  Administered 2020-01-03: 08:00:00 15 mg via INTRAVENOUS
  Filled 2020-01-03: qty 1

## 2020-01-03 MED ORDER — METHOCARBAMOL 500 MG PO TABS
500.0000 mg | ORAL_TABLET | Freq: Two times a day (BID) | ORAL | 0 refills | Status: AC
Start: 2020-01-03 — End: 2020-01-13

## 2020-01-03 MED ORDER — SODIUM CHLORIDE 0.9 % IV BOLUS
1000.0000 mL | Freq: Once | INTRAVENOUS | Status: AC
  Administered 2020-01-03: 1000 mL via INTRAVENOUS

## 2020-01-03 MED ORDER — ACETAMINOPHEN 325 MG PO TABS
650.0000 mg | ORAL_TABLET | Freq: Once | ORAL | Status: AC
  Administered 2020-01-03: 08:00:00 650 mg via ORAL
  Filled 2020-01-03: qty 2

## 2020-01-03 MED ORDER — METHOCARBAMOL 500 MG PO TABS
500.0000 mg | ORAL_TABLET | Freq: Once | ORAL | Status: AC
  Administered 2020-01-03: 500 mg via ORAL
  Filled 2020-01-03: qty 1

## 2020-01-03 NOTE — ED Notes (Signed)
Transported to CT 
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
## 2020-01-04 ENCOUNTER — Encounter (HOSPITAL_COMMUNITY): Payer: Self-pay

## 2020-04-24 ENCOUNTER — Ambulatory Visit (HOSPITAL_COMMUNITY)
Admission: EM | Admit: 2020-04-24 | Discharge: 2020-04-25 | Disposition: A | Discharge status: Other Acute Inpatient Hospital | Payer: Medicaid Other | Attending: Nurse Practitioner | Admitting: Nurse Practitioner

## 2020-04-24 ENCOUNTER — Other Ambulatory Visit: Payer: Self-pay

## 2020-04-24 DIAGNOSIS — F332 Major depressive disorder, recurrent severe without psychotic features: Secondary | ICD-10-CM | POA: Insufficient documentation

## 2020-04-24 DIAGNOSIS — F1910 Other psychoactive substance abuse, uncomplicated: Secondary | ICD-10-CM | POA: Insufficient documentation

## 2020-04-24 DIAGNOSIS — F4310 Post-traumatic stress disorder, unspecified: Secondary | ICD-10-CM | POA: Insufficient documentation

## 2020-04-24 DIAGNOSIS — Z8249 Family history of ischemic heart disease and other diseases of the circulatory system: Secondary | ICD-10-CM | POA: Insufficient documentation

## 2020-04-24 DIAGNOSIS — F17210 Nicotine dependence, cigarettes, uncomplicated: Secondary | ICD-10-CM | POA: Insufficient documentation

## 2020-04-24 DIAGNOSIS — Z9861 Coronary angioplasty status: Secondary | ICD-10-CM | POA: Insufficient documentation

## 2020-04-24 DIAGNOSIS — Z20822 Contact with and (suspected) exposure to covid-19: Secondary | ICD-10-CM | POA: Insufficient documentation

## 2020-04-24 DIAGNOSIS — F419 Anxiety disorder, unspecified: Secondary | ICD-10-CM | POA: Insufficient documentation

## 2020-04-24 DIAGNOSIS — F909 Attention-deficit hyperactivity disorder, unspecified type: Secondary | ICD-10-CM | POA: Insufficient documentation

## 2020-04-24 DIAGNOSIS — R45851 Suicidal ideations: Secondary | ICD-10-CM | POA: Insufficient documentation

## 2020-04-24 DIAGNOSIS — I341 Nonrheumatic mitral (valve) prolapse: Secondary | ICD-10-CM | POA: Insufficient documentation

## 2020-04-24 LAB — COMPREHENSIVE METABOLIC PANEL
ALT: 14 U/L (ref 0–44)
AST: 23 U/L (ref 15–41)
Albumin: 3.9 g/dL (ref 3.5–5.0)
Alkaline Phosphatase: 42 U/L (ref 38–126)
Anion gap: 12 (ref 5–15)
BUN: 15 mg/dL (ref 6–20)
CO2: 24 mmol/L (ref 22–32)
Calcium: 9.5 mg/dL (ref 8.9–10.3)
Chloride: 100 mmol/L (ref 98–111)
Creatinine, Ser: 1.15 mg/dL (ref 0.61–1.24)
GFR calc Af Amer: >60 mL/min (ref >60)
GFR calc non Af Amer: >60 mL/min (ref >60)
Glucose, Bld: 93 mg/dL (ref 70–99)
Potassium: 3.8 mmol/L (ref 3.5–5.1)
Sodium: 136 mmol/L (ref 135–145)
Total Bilirubin: 0.9 mg/dL (ref 0.3–1.2)
Total Protein: 6.2 g/dL — ABNORMAL LOW (ref 6.5–8.1)

## 2020-04-24 LAB — POCT URINE DRUG SCREEN - MANUAL ENTRY (I-SCREEN)
POC Amphetamine UR: NOT DETECTED
POC Buprenorphine (BUP): POSITIVE — AB
POC Cocaine UR: NOT DETECTED
POC Marijuana UR: POSITIVE — AB
POC Methadone UR: NOT DETECTED
POC Methamphetamine UR: NOT DETECTED
POC Morphine: NOT DETECTED
POC Oxazepam (BZO): NOT DETECTED
POC Oxycodone UR: NOT DETECTED
POC Secobarbital (BAR): NOT DETECTED

## 2020-04-24 LAB — CBC WITH DIFFERENTIAL/PLATELET
Abs Immature Granulocytes: 0.04 10*3/uL (ref 0.00–0.07)
Basophils Absolute: 0.1 10*3/uL (ref 0.0–0.1)
Basophils Relative: 1 %
Eosinophils Absolute: 0 10*3/uL (ref 0.0–0.5)
Eosinophils Relative: 0 %
HCT: 49.3 % (ref 39.0–52.0)
Hemoglobin: 16.4 g/dL (ref 13.0–17.0)
Immature Granulocytes: 0 %
Lymphocytes Relative: 15 %
Lymphs Abs: 2 10*3/uL (ref 0.7–4.0)
MCH: 30.1 pg (ref 26.0–34.0)
MCHC: 33.3 g/dL (ref 30.0–36.0)
MCV: 90.5 fL (ref 80.0–100.0)
Monocytes Absolute: 0.6 10*3/uL (ref 0.1–1.0)
Monocytes Relative: 5 %
Neutro Abs: 10.2 10*3/uL — ABNORMAL HIGH (ref 1.7–7.7)
Neutrophils Relative %: 79 %
Platelets: 364 10*3/uL (ref 150–400)
RBC: 5.45 MIL/uL (ref 4.22–5.81)
RDW: 14.7 % (ref 11.5–15.5)
WBC: 12.9 10*3/uL — ABNORMAL HIGH (ref 4.0–10.5)
nRBC: 0 % (ref 0.0–0.2)

## 2020-04-24 LAB — POC SARS CORONAVIRUS 2 AG: SARS Coronavirus 2 Ag: NEGATIVE

## 2020-04-24 LAB — SARS CORONAVIRUS 2 BY RT PCR (HOSPITAL ORDER, PERFORMED IN ~~LOC~~ HOSPITAL LAB): SARS Coronavirus 2: NEGATIVE

## 2020-04-24 LAB — POC SARS CORONAVIRUS 2 AG -  ED: SARS Coronavirus 2 Ag: NEGATIVE

## 2020-04-24 LAB — TSH: TSH: 2.481 u[IU]/mL (ref 0.350–4.500)

## 2020-04-24 MED ORDER — TRAZODONE HCL 50 MG PO TABS: 50.0000 mg | ORAL_TABLET | Freq: Every evening | ORAL | Status: DC | PRN

## 2020-04-24 MED ORDER — ACETAMINOPHEN 325 MG PO TABS: 650.0000 mg | ORAL_TABLET | Freq: Four times a day (QID) | ORAL | Status: DC | PRN

## 2020-04-24 MED ORDER — HYDROXYZINE HCL 25 MG PO TABS
25.0000 mg | ORAL_TABLET | Freq: Three times a day (TID) | ORAL | Status: DC | PRN
  Administered 2020-04-25: 25 mg via ORAL
  Filled 2020-04-24: qty 1

## 2020-04-24 MED ORDER — MAGNESIUM HYDROXIDE 400 MG/5ML PO SUSP: 30.0000 mL | Freq: Every day | ORAL | Status: DC | PRN

## 2020-04-24 MED ORDER — ESCITALOPRAM OXALATE 10 MG PO TABS
10.0000 mg | ORAL_TABLET | Freq: Every day | ORAL | Status: DC
  Administered 2020-04-24 – 2020-04-25 (×2): 10 mg via ORAL
  Filled 2020-04-24 (×2): qty 1

## 2020-04-24 MED ORDER — ALUM & MAG HYDROXIDE-SIMETH 200-200-20 MG/5ML PO SUSP: 30.0000 mL | ORAL | Status: DC | PRN
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
## 2020-04-24 NOTE — ED Notes (Signed)
Nira Conn, NP, patient meets inpatient criteria. Per Hassie Bruce, no appropriate beds at [facility]. SW will seek placement.
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
## 2020-04-24 NOTE — ED Provider Notes (Signed)
Behavioral Health Admission H&P [facility](FBC & OBS)  Date: 04/25/20 Patient Name: Steven BorerDonnie R Kitamura Jr. MRN: [id number] Chief Complaint:  Chief Complaint  Patient presents with  . Suicidal  . Depression   Chief Complaint/Presenting Problem: SI with attempt of jumping off bridge.  Diagnoses:  Final diagnoses:  Severe episode of recurrent major depressive disorder, without psychotic features (HCC)    HPI: Steven Montoya is a 44 y.o. male with a history of MDD and polysubstance abuse who presents voluntarily with law enforcement to [facility] due to suicidal thoughts with a plan to jump off of a bridge. Patient reports that earlier today his girlfriend of 4 years broke up with him. He states that he walk to a bridge and was contemplating jumping off. States that someone passing by stopped and talk with him. That person contacted the police. He states that he has had suicidal thoughts off and on for most of his life. He reports a history of opioid abuse ("heroin and pills"), alcohol use, cocaine use, and methamphetamine use. He states that other than occasionally smoking marijuana use he has not used any illicit substances in 4 years. He reports surgery 4 years ago due to sever mitral vale prolapse. He states that he has been on suboxone for the past 4 years. Patient is currently receiving outpatient services through [facility] for suboxone treatment and outpatient therapy with Zena AmosPearl Griffin.   On evaluation patient is alert and oriented x 4, pleasant, and cooperative. Speech is clear and coherent. Mood is depressed and affect is congruent with mood. He is tearful at times.Thought process is coherent and thought content is logical. Denies audiovisual hallucinations. No indication that patient is responding to internal stimuli. No evidence of delusional thought content. Endorses suicidal ideations with thoughts of jumping off of a bridge.  Denies homicidal ideations.     PHQ 2-9:     [facility]  C-SSRS RISK CATEGORY High Risk       Total Time spent with patient: 30 minutes  Musculoskeletal  Strength & Muscle Tone: within normal limits Gait & Station: normal Patient leans: N/A  Psychiatric Specialty Exam  Presentation General Appearance: Appropriate for Environment;Fairly Groomed  Eye Contact:Fair  Speech:Clear and Coherent;Normal Rate  Speech Volume:Decreased  Handedness:Right   Mood and Affect  Mood:Anxious;Depressed;Hopeless;Worthless  Affect:Congruent;Depressed;Tearful   Thought Process  Thought Processes:Coherent  Descriptions of Associations:Intact  Orientation:Full (Time, Place and Person)  Thought Content:Logical  Hallucinations:Hallucinations: None  Ideas of Reference:None  Suicidal Thoughts:Suicidal Thoughts: Yes, Active SI Active Intent and/or Plan: With Intent;With Plan;With Means to Carry Out  Homicidal Thoughts:Homicidal Thoughts: No   Sensorium  Memory:Immediate Good;Recent Good;Remote Good  Judgment:Impaired  Insight:Fair   Executive Functions  Concentration:Fair  Attention Span:Fair  Recall:Good  Fund of Knowledge:Good  Language:Good   Psychomotor Activity  Psychomotor Activity:Psychomotor Activity: Normal   Assets  Assets:Communication Skills;Desire for Improvement;Financial Resources/Insurance;Leisure Time;Resilience   Sleep  Sleep:Sleep: Fair   Physical Exam Constitutional:      General: He is not in acute distress.    Appearance: He is not ill-appearing, toxic-appearing or diaphoretic.  HENT:     Montoya: Normocephalic.     Right Ear: External ear normal.     Left Ear: External ear normal.  Eyes:     Pupils: Pupils are equal, round, and reactive to light.  Cardiovascular:     Rate and Rhythm: Normal rate.  Pulmonary:     Effort: Pulmonary effort is normal. No  respiratory distress.  Musculoskeletal:        General: Normal range of motion.  Skin:    General: Skin  is warm and dry.  Neurological:     Mental Status: He is alert and oriented to person, place, and time.  Psychiatric:        Mood and Affect: Mood is depressed.        Speech: Speech normal.        Behavior: Behavior is cooperative.        Thought Content: Thought content is not paranoid or delusional. Thought content includes suicidal ideation. Thought content does not include homicidal ideation. Thought content includes suicidal plan.    Review of Systems  Constitutional: Negative for chills, diaphoresis, fever, malaise/fatigue and weight loss.  HENT: Negative for congestion.   Respiratory: Negative for cough and shortness of breath.   Cardiovascular: Negative for chest pain and palpitations.  Gastrointestinal: Negative for diarrhea, nausea and vomiting.  Neurological: Negative for dizziness and seizures.  Psychiatric/Behavioral: Positive for depression and suicidal ideas. Negative for hallucinations, memory loss and substance abuse. The patient is nervous/anxious and has insomnia.   All other systems reviewed and are negative.   Blood pressure 123/83, pulse 86, temperature 98.4 F (36.9 C), temperature source Oral, resp. rate 18, SpO2 100 %. There is no height or weight on file to calculate BMI.  Past Psychiatric History: MDD, polysubstance abuse, inpatient at [facility] in 2017  Is the patient at risk to self? Yes  Has the patient been a risk to self in the past 6 months? No .    Has the patient been a risk to self within the distant past? Yes   Is the patient a risk to others? No   Has the patient been a risk to others in the past 6 months? No   Has the patient been a risk to others within the distant past? No   Past Medical History:  Past Medical History:  Diagnosis Date  . ADHD   . Anxiety   . Chronic urethral stricture   . Depression   . Diverticulitis   . Frequency of urination   . Headache(784.0)   . Lower abdominal pain   . Mitral valve prolapse   . Nocturia   . PTSD  (post-traumatic stress disorder)   . 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 Urgency of urination   . Urinary straining     Past Surgical History:  Procedure Laterality Date  . CARDIAC SURGERY    . COLONOSCOPY N/A 08/17/2013   Procedure: COLONOSCOPY;  Surgeon: Romie Levee, MD;  Location: [facility] ENDOSCOPY;  Service: Endoscopy;  Laterality: N/A;  . CYSTO/ RETROGRADE PYELOGRAM/ URETHRAL DILATION  11-05-2010  . MITRAL VALVE REPAIR Right 09/07/2017   Procedure: MINIMALLY INVASIVE MITRAL VALVE REPAIR (MVR) using Sorin Memo 3D Ring size 34;  Surgeon: Purcell Nails, MD;  Location: [facility] OR;  Service: Open Heart Surgery;  Laterality: Right;  . RIGHT/LEFT HEART CATH AND CORONARY ANGIOGRAPHY N/A 07/22/2017   Procedure: RIGHT/LEFT HEART CATH AND CORONARY ANGIOGRAPHY;  Surgeon: Tonny Bollman, MD;  Location: [facility] INVASIVE CV LAB;  Service: Cardiovascular;  Laterality: N/A;  . TEE WITHOUT CARDIOVERSION N/A 06/28/2017   Procedure: TRANSESOPHAGEAL ECHOCARDIOGRAM (TEE);  Surgeon: Jake Bathe, MD;  Location: [facility] ENDOSCOPY;  Service: Cardiovascular;  Laterality: N/A;  .  TEE WITHOUT CARDIOVERSION N/A 09/07/2017   Procedure: TRANSESOPHAGEAL ECHOCARDIOGRAM (TEE);  Surgeon: Purcell Nails, MD;  Location[facility];  Service: Open Heart Surgery;  Laterality: N/A;  . TOOTH EXTRACTION     canines    Family History:  Family History  Problem Relation Age of Onset  . Heart disease Father     Social History:  Social History   Socioeconomic History  . Marital status: Married    Spouse name: Not on file  . Number of children: Not on file  . Years of education: Not on file  . Highest education level: Not on file  Occupational History  . Not on file  Tobacco Use  . Smoking status: Current Some Day Smoker  .  Smokeless tobacco: Never Used  Substance and Sexual Activity  . Alcohol use: Yes    Comment: Using for 25 years  . Drug use: Not Currently    Types: Marijuana, "Crack" cocaine, Methamphetamines    Comment: pt is in jail  . Sexual activity: Not on file  Other Topics Concern  . Not on file  Social History Narrative   ** Merged History Encounter **       Social Determinants of Health   Financial Resource Strain:   . Difficulty of Paying Living Expenses: Not on file  Food Insecurity:   . Worried About Programme researcher, broadcasting/film/video in the Last Year: Not on file  . Ran Out of Food in the Last Year: Not on file  Transportation Needs:   . Lack of Transportation (Medical): Not on file  . Lack of Transportation (Non-Medical): Not on file  Physical Activity:   . Days of Exercise per Week: Not on file  . Minutes of Exercise per Session: Not on file  Stress:   . Feeling of Stress : Not on file  Social Connections:   . Frequency of Communication with Friends and Family: Not on file  . Frequency of Social Gatherings with Friends and Family: Not on file  . Attends Religious Services: Not on file  . Active Member of Clubs or Organizations: Not on file  . Attends Banker Meetings: Not on file  . Marital Status: Not on file  Intimate Partner Violence:   . Fear of Current or Ex-Partner: Not on file  . Emotionally Abused: Not on file  . Physically Abused: Not on file  . Sexually Abused: Not on file    SDOH:  SDOH Screenings   Alcohol Screen:   . Last Alcohol Screening Score (AUDIT): Not on file  Depression (PHQ2-9):   . PHQ-2 Score: Not on file  Financial Resource Strain:   . Difficulty of Paying Living Expenses: Not on file  Food Insecurity:   . Worried About Programme researcher, broadcasting/film/video in the Last Year: Not on file  . Ran Out of Food in the Last Year: Not on file  Housing:   . Last Housing Risk Score: Not on file  Physical Activity:   . Days of Exercise per Week: Not on file  .  Minutes of Exercise per Session: Not on file  Social Connections:   . Frequency of Communication with Friends and Family: Not on file  . Frequency of Social Gatherings with Friends and Family: Not on file  . Attends Religious Services: Not on file  . Active Member of Clubs or Organizations: Not on file  . Attends Banker Meetings: Not on file  . Marital Status: Not on file  Stress:   . Feeling of Stress : Not on file  Tobacco Use: High Risk  . Smoking Tobacco Use: Current Some Day Smoker  . Smokeless Tobacco Use: Never Used  Transportation Needs:   . Freight forwarder (Medical): Not on file  . Lack of Transportation (Non-Medical): Not on file    Last Labs:  Admission on 04/24/2020  Component Date Value Ref Range Status  . SARS Coronavirus 2 04/24/2020 NEGATIVE  NEGATIVE Final   Comment: (NOTE) SARS-CoV-2 target nucleic acids are NOT DETECTED.  The SARS-CoV-2 RNA is generally detectable in upper and lower respiratory specimens during the acute phase of infection. The lowest concentration of SARS-CoV-2 viral copies this assay can detect is 250 copies / mL. A negative result does not preclude SARS-CoV-2 infection and should not be used as the sole basis for treatment or other patient management decisions.  A negative result may occur with improper specimen collection / handling, submission of specimen other than nasopharyngeal swab, presence of viral mutation(s) within the areas targeted by this assay, and inadequate number of viral copies (<250 copies / mL). A negative result must be combined with clinical observations, patient history, and epidemiological information.  Fact Sheet for Patients:   BoilerBrush.com.cy  Fact Sheet for Healthcare Providers: https://pope.com/  This test is not yet approved or                           cleared by the [place] FDA and has been authorized for detection and/or  diagnosis of SARS-CoV-2 by FDA under an Emergency Use Authorization (EUA).  This EUA will remain in effect (meaning this test can be used) for the duration of the COVID-19 declaration under Section 564(b)(1) of the Act, 21 U.S.C. section 360bbb-3(b)(1), unless the authorization is terminated or revoked sooner.  Performed at [facility], [address] [place], [place] [address]   . SARS Coronavirus 2 Ag 04/24/2020 Negative  Negative Final  . WBC 04/24/2020 12.9* 4.0 - 10.5 K/uL Final  . RBC 04/24/2020 5.45  4.22 - 5.81 MIL/uL Final  . Hemoglobin 04/24/2020 16.4  13.0 - 17.0 g/dL Final  . HCT 70/35/0093 49.3  39 - 52 % Final  . MCV 04/24/2020 90.5  80.0 - 100.0 fL Final  . MCH 04/24/2020 30.1  26.0 - 34.0 pg Final  . MCHC 04/24/2020 33.3  30.0 - 36.0 g/dL Final  . RDW 81/82/9937 14.7  11.5 - 15.5 % Final  . Platelets 04/24/2020 364  150 - 400 K/uL Final  . nRBC 04/24/2020 0.0  0.0 - 0.2 % Final  . Neutrophils Relative % 04/24/2020 79  % Final  . Neutro Abs 04/24/2020 10.2* 1.7 - 7.7 K/uL Final  . Lymphocytes Relative 04/24/2020 15  % Final  . Lymphs Abs 04/24/2020 2.0  0.7 - 4.0 K/uL Final  . Monocytes Relative 04/24/2020 5  % Final  . Monocytes Absolute 04/24/2020 0.6  0 - 1 K/uL Final  . Eosinophils Relative 04/24/2020 0  % Final  . Eosinophils Absolute 04/24/2020 0.0  0 - 0 K/uL Final  . Basophils Relative 04/24/2020 1  % Final  . Basophils Absolute 04/24/2020 0.1  0 - 0 K/uL Final  . Immature Granulocytes 04/24/2020 0  % Final  . Abs Immature Granulocytes 04/24/2020 0.04  0.00 - 0.07 K/uL Final   Performed at [facility], [address], [place], [place] [address]  . Sodium 04/24/2020 136  135 -  145 mmol/L Final  . Potassium 04/24/2020 3.8  3.5 - 5.1 mmol/L Final  . Chloride 04/24/2020 100  98 - 111 mmol/L Final  . CO2 04/24/2020 24  22 - 32 mmol/L Final  . Glucose, Bld 04/24/2020 93  70 - 99 mg/dL Final   Glucose reference range applies only to samples taken  after fasting for at least 8 hours.  . BUN 04/24/2020 15  6 - 20 mg/dL Final  . Creatinine, Ser 04/24/2020 1.15  0.61 - 1.24 mg/dL Final  . Calcium 16/05/9603 9.5  8.9 - 10.3 mg/dL Final  . Total Protein 04/24/2020 6.2* 6.5 - 8.1 g/dL Final  . Albumin 54/04/8118 3.9  3.5 - 5.0 g/dL Final  . AST 14/78/2956 23  15 - 41 U/L Final  . ALT 04/24/2020 14  0 - 44 U/L Final  . Alkaline Phosphatase 04/24/2020 42  38 - 126 U/L Final  . Total Bilirubin 04/24/2020 0.9  0.3 - 1.2 mg/dL Final  . GFR calc non Af Amer 04/24/2020 >60  >60 mL/min Final  . GFR calc Af Amer 04/24/2020 >60  >60 mL/min Final  . Anion gap 04/24/2020 12  5 - 15 Final   Performed at [facility], [address], [place], [place] [address]  . TSH 04/24/2020 2.481  0.350 - 4.500 uIU/mL Final   Comment: Performed by a 3rd Generation assay with a functional sensitivity of <=0.01 uIU/mL. Performed at [facility], [address], [place], [place] [address]   . POC Amphetamine UR 04/24/2020 None Detected  None Detected Final  . POC Secobarbital (BAR) 04/24/2020 None Detected  None Detected Final  . POC Buprenorphine (BUP) 04/24/2020 Positive* None Detected Final  . POC Oxazepam (BZO) 04/24/2020 None Detected  None Detected Final  . POC Cocaine UR 04/24/2020 None Detected  None Detected Final  . POC Methamphetamine UR 04/24/2020 None Detected  None Detected Final  . POC Morphine 04/24/2020 None Detected  None Detected Final  . POC Oxycodone UR 04/24/2020 None Detected  None Detected Final  . POC Methadone UR 04/24/2020 None Detected  None Detected Final  . POC Marijuana UR 04/24/2020 Positive* None Detected Final  . SARS Coronavirus 2 Ag 04/24/2020 NEGATIVE  NEGATIVE Final   Comment: (NOTE) SARS-CoV-2 antigen NOT DETECTED.   Negative results are presumptive.  Negative results do not preclude SARS-CoV-2 infection and should not be used as the sole basis for treatment or other patient management decisions, including  infection  control decisions, particularly in the presence of clinical signs and  symptoms consistent with COVID-19, or in those who have been in contact with the virus.  Negative results must be combined with clinical observations, patient history, and epidemiological information. The expected result is Negative.  Fact Sheet for Patients: https://sanders-williams.net/  Fact Sheet for Healthcare Providers: https://martinez.com/   This test is not yet approved or cleared by the [place] FDA and  has been authorized for detection and/or diagnosis of SARS-CoV-2 by FDA under an Emergency Use Authorization (EUA).  This EUA will remain in effect (meaning this test can be used) for the duration of  the C                          OVID-19 declaration under Section 564(b)(1) of the Act, 21 U.S.C. section 360bbb-3(b)(1), unless the authorization is terminated or revoked sooner.    Admission on 01/03/2020, Discharged on 01/03/2020  Component Date Value Ref Range  Status  . Sodium 01/03/2020 140  135 - 145 mmol/L Final  . Potassium 01/03/2020 3.8  3.5 - 5.1 mmol/L Final  . Chloride 01/03/2020 105  98 - 111 mmol/L Final  . CO2 01/03/2020 23  22 - 32 mmol/L Final  . Glucose, Bld 01/03/2020 82  70 - 99 mg/dL Final   Glucose reference range applies only to samples taken after fasting for at least 8 hours.  . BUN 01/03/2020 12  6 - 20 mg/dL Final  . Creatinine, Ser 01/03/2020 1.06  0.61 - 1.24 mg/dL Final  . Calcium 42/59/5638 9.4  8.9 - 10.3 mg/dL Final  . Total Protein 01/03/2020 6.3* 6.5 - 8.1 g/dL Final  . Albumin 75/64/3329 3.7  3.5 - 5.0 g/dL Final  . AST 51/88/4166 81* 15 - 41 U/L Final  . ALT 01/03/2020 59* 0 - 44 U/L Final  . Alkaline Phosphatase 01/03/2020 54  38 - 126 U/L Final  . Total Bilirubin 01/03/2020 0.9  0.3 - 1.2 mg/dL Final  . GFR calc non Af Amer 01/03/2020 >60  >60 mL/min Final  . GFR calc Af Amer 01/03/2020 >60  >60 mL/min Final  .  Anion gap 01/03/2020 12  5 - 15 Final   Performed at [facility], [address] [place], [place] [address]  . Sodium 01/03/2020 140  135 - 145 mmol/L Final  . Potassium 01/03/2020 3.6  3.5 - 5.1 mmol/L Final  . Chloride 01/03/2020 105  98 - 111 mmol/L Final  . BUN 01/03/2020 12  6 - 20 mg/dL Final  . Creatinine, Ser 01/03/2020 1.00  0.61 - 1.24 mg/dL Final  . Glucose, Bld 60/05/9322 75  70 - 99 mg/dL Final   Glucose reference range applies only to samples taken after fasting for at least 8 hours.  . Calcium, Ion 01/03/2020 1.14* 1.15 - 1.40 mmol/L Final  . TCO2 01/03/2020 25  22 - 32 mmol/L Final  . Hemoglobin 01/03/2020 14.6  13.0 - 17.0 g/dL Final  . HCT 55/73/2202 43.0  39 - 52 % Final  . WBC 01/03/2020 10.7* 4.0 - 10.5 K/uL Final  . RBC 01/03/2020 4.60  4.22 - 5.81 MIL/uL Final  . Hemoglobin 01/03/2020 14.1  13.0 - 17.0 g/dL Final  . HCT 54/27/0623 40.8  39 - 52 % Final  . MCV 01/03/2020 88.7  80.0 - 100.0 fL Final  . MCH 01/03/2020 30.7  26.0 - 34.0 pg Final  . MCHC 01/03/2020 34.6  30.0 - 36.0 g/dL Final  . RDW 76/28/3151 14.1  11.5 - 15.5 % Final  . Platelets 01/03/2020 315  150 - 400 K/uL Final  . nRBC 01/03/2020 0.0  0.0 - 0.2 % Final   Performed at [facility], [address], [place], [place] [address]  . Alcohol, Ethyl (B) 01/03/2020 <10  <10 mg/dL Final   Comment: (NOTE) Lowest detectable limit for serum alcohol is 10 mg/dL. For medical purposes only. Performed at [facility], [address] [place], [place] [address]   . Lactic Acid, Venous 01/03/2020 0.8  0.5 - 1.9 mmol/L Final   Performed at [facility], [address] [place], [place] [address]  . Prothrombin Time 01/03/2020 12.8  11.4 - 15.2 seconds Final  . INR 01/03/2020 1.0  0.8 - 1.2 Final   Comment: (NOTE) INR goal varies based on device and disease states. Performed at [facility], [address] [place], [place] [address]   .  Blood Bank Specimen 01/03/2020 SAMPLE  AVAILABLE FOR TESTING   Final  . Sample Expiration 01/03/2020    Final                   Value:01/04/2020,2359 Performed at [facility], [address], [place], [place] [address]   . CDS serology specimen 01/03/2020 SPECIMEN WILL BE HELD FOR 14 DAYS IF TESTING IS REQUIRED   Final   Performed at [facility], [address], [place], [place] [address]    Allergies: Latex and Adhesive [tape]  PTA Medications: (Not in a hospital admission)   Medical Decision Making  Admission labs ordered  Discussed starting an SSRI for depression and anxiety. Patient states that he has taken sertraline in the past and that it made him drowsy and irritable.   Discussed risks/benfits and side effects of escitalopram. Patient is in a agreement with starting escitalopram.  Start escitalopram at 10 mg daily for depression/anxiety  hydroxyzine 25 mg TID prn for anxiety trazodone 50 mg QHS prn for sleep    Recommendations  Based on my evaluation the patient does not appear to have an emergency medical condition.   Patient will be placed in the continuous assessment area at [facility] for treatment and stabilization. He will be reevaluated on 04/25/2020. The treatment team will determine disposition at that time.      Jackelyn Poling, NP 04/25/20  4:50 AM

## 2020-04-25 ENCOUNTER — Inpatient Hospital Stay (HOSPITAL_COMMUNITY)
Admission: AD | Admit: 2020-04-25 | Discharge: 2020-04-29 | DRG: 885 | Disposition: A | Discharge status: Home / Self Care | Payer: Medicaid Other | Source: Intra-hospital | Attending: Psychiatry | Admitting: Psychiatry

## 2020-04-25 DIAGNOSIS — F1120 Opioid dependence, uncomplicated: Secondary | ICD-10-CM | POA: Diagnosis present

## 2020-04-25 DIAGNOSIS — K219 Gastro-esophageal reflux disease without esophagitis: Secondary | ICD-10-CM | POA: Diagnosis present

## 2020-04-25 DIAGNOSIS — Z590 Homelessness: Secondary | ICD-10-CM

## 2020-04-25 DIAGNOSIS — Z20822 Contact with and (suspected) exposure to covid-19: Secondary | ICD-10-CM | POA: Diagnosis present

## 2020-04-25 DIAGNOSIS — F332 Major depressive disorder, recurrent severe without psychotic features: Principal | ICD-10-CM

## 2020-04-25 DIAGNOSIS — F1121 Opioid dependence, in remission: Secondary | ICD-10-CM | POA: Diagnosis not present

## 2020-04-25 DIAGNOSIS — R45851 Suicidal ideations: Secondary | ICD-10-CM | POA: Diagnosis present

## 2020-04-25 DIAGNOSIS — F1910 Other psychoactive substance abuse, uncomplicated: Secondary | ICD-10-CM | POA: Diagnosis present

## 2020-04-25 DIAGNOSIS — G4700 Insomnia, unspecified: Secondary | ICD-10-CM | POA: Diagnosis present

## 2020-04-25 MED ORDER — CLONIDINE HCL 0.1 MG PO TABS: 0.1000 mg | ORAL_TABLET | ORAL | Status: DC

## 2020-04-25 MED ORDER — NAPROXEN 500 MG PO TABS: 500.0000 mg | ORAL_TABLET | Freq: Two times a day (BID) | ORAL | Status: DC | PRN

## 2020-04-25 MED ORDER — ACETAMINOPHEN 325 MG PO TABS: 650.0000 mg | ORAL_TABLET | Freq: Four times a day (QID) | ORAL | Status: DC | PRN

## 2020-04-25 MED ORDER — PANTOPRAZOLE SODIUM 40 MG PO TBEC
40.0000 mg | DELAYED_RELEASE_TABLET | Freq: Every day | ORAL | Status: DC
  Administered 2020-04-25 – 2020-04-29 (×5): 40 mg via ORAL
  Filled 2020-04-25 (×8): qty 1

## 2020-04-25 MED ORDER — BUPRENORPHINE HCL-NALOXONE HCL 8-2 MG SL SUBL
1.0000 | SUBLINGUAL_TABLET | Freq: Three times a day (TID) | SUBLINGUAL | Status: DC
  Administered 2020-04-25 – 2020-04-29 (×11): 1 via SUBLINGUAL
  Filled 2020-04-25 (×11): qty 1

## 2020-04-25 MED ORDER — DICYCLOMINE HCL 20 MG PO TABS: 20.0000 mg | ORAL_TABLET | Freq: Four times a day (QID) | ORAL | Status: DC | PRN

## 2020-04-25 MED ORDER — HYDROXYZINE HCL 25 MG PO TABS
25.0000 mg | ORAL_TABLET | Freq: Four times a day (QID) | ORAL | Status: DC | PRN
  Administered 2020-04-25 – 2020-04-29 (×4): 25 mg via ORAL
  Filled 2020-04-25 (×4): qty 1

## 2020-04-25 MED ORDER — BUPRENORPHINE HCL-NALOXONE HCL 8-2 MG SL SUBL
1.0000 | SUBLINGUAL_TABLET | Freq: Three times a day (TID) | SUBLINGUAL | Status: DC
  Administered 2020-04-25 (×2): 1 via SUBLINGUAL
  Filled 2020-04-25 (×2): qty 1

## 2020-04-25 MED ORDER — TRAZODONE HCL 50 MG PO TABS
50.0000 mg | ORAL_TABLET | Freq: Every evening | ORAL | Status: DC | PRN
  Filled 2020-04-25 (×2): qty 1

## 2020-04-25 MED ORDER — METHOCARBAMOL 500 MG PO TABS: 500.0000 mg | ORAL_TABLET | Freq: Three times a day (TID) | ORAL | Status: DC | PRN

## 2020-04-25 MED ORDER — METOCLOPRAMIDE HCL 10 MG PO TABS
10.0000 mg | ORAL_TABLET | Freq: Three times a day (TID) | ORAL | Status: DC
  Administered 2020-04-26 – 2020-04-29 (×10): 10 mg via ORAL
  Filled 2020-04-25 (×14): qty 1

## 2020-04-25 MED ORDER — ONDANSETRON 4 MG PO TBDP: 4.0000 mg | ORAL_TABLET | Freq: Four times a day (QID) | ORAL | Status: DC | PRN

## 2020-04-25 MED ORDER — LOPERAMIDE HCL 2 MG PO CAPS: 2.0000 mg | ORAL_CAPSULE | ORAL | Status: DC | PRN

## 2020-04-25 MED ORDER — CLONIDINE HCL 0.1 MG PO TABS: 0.1000 mg | ORAL_TABLET | Freq: Every day | ORAL | Status: DC

## 2020-04-25 MED ORDER — ALUM & MAG HYDROXIDE-SIMETH 200-200-20 MG/5ML PO SUSP: 30.0000 mL | ORAL | Status: DC | PRN

## 2020-04-25 MED ORDER — CLONIDINE HCL 0.1 MG PO TABS: 0.1000 mg | ORAL_TABLET | Freq: Four times a day (QID) | ORAL | Status: DC

## 2020-04-25 MED ORDER — MAGNESIUM HYDROXIDE 400 MG/5ML PO SUSP: 30.0000 mL | Freq: Every day | ORAL | Status: DC | PRN

## 2020-04-25 MED ORDER — HYDROXYZINE HCL 25 MG PO TABS: 25.0000 mg | ORAL_TABLET | Freq: Three times a day (TID) | ORAL | Status: DC | PRN

## 2020-04-25 MED ORDER — LORAZEPAM 1 MG PO TABS: 1.0000 mg | ORAL_TABLET | Freq: Four times a day (QID) | ORAL | Status: DC | PRN

## 2020-04-25 NOTE — ED Notes (Signed)
Patient offered breakfast; declined offer.

## 2020-04-25 NOTE — ED Notes (Signed)
Pt given lunch: salad and orange juice.

## 2020-04-25 NOTE — Progress Notes (Signed)
Admission Note: Patient is a 44 year old male admitted to the unit for suicidal ideation and depression.  Patient is alert and oriented x 4.  Presents with anxious affect and mood.  States he is here to get help for his depression and suicidal thoughts.  Reports stressors includes relationship issues with his girlfriend of 4 years.  Admission plan of care reviewed and consent signed.  Skin assessment and personal belongings completed.  Skin is dry and intact.  No contraband found.  Patient oriented to the unit, staff and room.  Routine safety checks initiated.  Patient is safe on the unit.

## 2020-04-25 NOTE — Progress Notes (Signed)
Received Steven Montoya this AM asleep in his chair bed, he was awaken for VS and offered breakfast. He has refused all meals thus far. He is hoping to be transferred to a [facility] psychiatric facility later today.

## 2020-04-25 NOTE — ED Notes (Signed)
Patient discharged to [facility] . Transported via [employer].

## 2020-04-25 NOTE — ED Provider Notes (Signed)
FBC/OBS ASAP Discharge Summary  Date and Time: 04/25/2020 2:28 PM  Name: Steven Montoya.  MRN:  [id number]   Discharge Diagnoses:  Final diagnoses:  Severe episode of recurrent major depressive disorder, without psychotic features (HCC)    Subjective: Patient reports today that he continues to feel suicidal and extremely depressed.  Patient states that he feels that he needs to be admitted to the hospital.  Stay Summary: Patient is a 44 year old male with a history of MDD and polysubstance abuse who presents voluntarily with law enforcement to the [facility] due to suicidal thoughts with a plan to jump off a bridge.  Patient reported that he was up on the side of a bridge and someone talked him down.  They contacted police and the patient was brought into the hospital.  Patient reports a history of opioid abuse and has been on Suboxone for approximately 40 years.  [facility] was contacted at [phone] and I confirmed that the patient is on Suboxone 8-2 mg film 3 times daily.  Last fill date was 04/19/2020.  They state that the patient is still an active member with him.  This information was relayed to the pharmacy and his medication was continued.  Patient was also started on Lexapro, Vistaril, and trazodone while he has been here.  Patient has continued to endorse suicidal ideations and appeared to be very depressed.  Patient has been accepted at the [facility] for inpatient treatment.  Patient will be transferred ASAP  Total Time spent with patient: 30 minutes  Past Psychiatric History: ADHD, Anxiety, MDD, PTSD, history of substance abuse Past Medical History:  Past Medical History:  Diagnosis Date  . ADHD   . Anxiety   . Chronic urethral stricture   . Depression   . Diverticulitis   . Frequency of urination   . Headache(784.0)   . Lower abdominal pain   . Mitral valve prolapse   . Nocturia   . PTSD (post-traumatic stress disorder)   . S/P minimally invasive mitral valve repair 09/07/2017    Complex valvuloplasty including triangular resection of posterior leaflet, artificial Goretex neochords x6 and 34 mm Sorin Memo 3D ring annuloplasty via right mini thoracotomy approach  . Urgency of urination   . Urinary straining     Past Surgical History:  Procedure Laterality Date  . CARDIAC SURGERY    . COLONOSCOPY N/A 08/17/2013   Procedure: COLONOSCOPY;  Surgeon: Romie Levee, MD;  Location: [facility] ENDOSCOPY;  Service: Endoscopy;  Laterality: N/A;  . CYSTO/ RETROGRADE PYELOGRAM/ URETHRAL DILATION  11-05-2010  . MITRAL VALVE REPAIR Right 09/07/2017   Procedure: MINIMALLY INVASIVE MITRAL VALVE REPAIR (MVR) using Sorin Memo 3D Ring size 34;  Surgeon: Purcell Nails, MD;  Location: [facility] OR;  Service: Open Heart Surgery;  Laterality: Right;  . RIGHT/LEFT HEART CATH AND CORONARY ANGIOGRAPHY N/A 07/22/2017   Procedure: RIGHT/LEFT HEART CATH AND CORONARY ANGIOGRAPHY;  Surgeon: Tonny Bollman, MD;  Location: [facility] INVASIVE CV LAB;  Service: Cardiovascular;  Laterality: N/A;  . TEE WITHOUT CARDIOVERSION N/A 06/28/2017   Procedure: TRANSESOPHAGEAL ECHOCARDIOGRAM (TEE);  Surgeon: Jake Bathe, MD;  Location: [facility] ENDOSCOPY;  Service: Cardiovascular;  Laterality: N/A;  . TEE WITHOUT CARDIOVERSION N/A 09/07/2017   Procedure: TRANSESOPHAGEAL ECHOCARDIOGRAM (TEE);  Surgeon: Purcell Nails, MD;  Location: [facility] OR;  Service: Open Heart Surgery;  Laterality: N/A;  . TOOTH EXTRACTION     canines   Family History:  Family History  Problem Relation Age of Onset  . Heart  disease Father    Family Psychiatric History: None reported Social History:  Social History   Substance and Sexual Activity  Alcohol Use Yes   Comment: Using for 25 years     Social History   Substance and Sexual Activity  Drug Use Not Currently  . Types: Marijuana, "Crack" cocaine, Methamphetamines   Comment: pt is in jail    Social History   Socioeconomic History  . Marital status: Married    Spouse name: Not on file  .  Number of children: Not on file  . Years of education: Not on file  . Highest education level: Not on file  Occupational History  . Not on file  Tobacco Use  . Smoking status: Current Some Day Smoker  . Smokeless tobacco: Never Used  Substance and Sexual Activity  . Alcohol use: Yes    Comment: Using for 25 years  . Drug use: Not Currently    Types: Marijuana, "Crack" cocaine, Methamphetamines    Comment: pt is in jail  . Sexual activity: Not on file  Other Topics Concern  . Not on file  Social History Narrative   ** Merged History Encounter **       Social Determinants of Health   Financial Resource Strain:   . Difficulty of Paying Living Expenses: Not on file  Food Insecurity:   . Worried About Programme researcher, broadcasting/film/videounning Out of Food in the Last Year: Not on file  . Ran Out of Food in the Last Year: Not on file  Transportation Needs:   . Lack of Transportation (Medical): Not on file  . Lack of Transportation (Non-Medical): Not on file  Physical Activity:   . Days of Exercise per Week: Not on file  . Minutes of Exercise per Session: Not on file  Stress:   . Feeling of Stress : Not on file  Social Connections:   . Frequency of Communication with Friends and Family: Not on file  . Frequency of Social Gatherings with Friends and Family: Not on file  . Attends Religious Services: Not on file  . Active Member of Clubs or Organizations: Not on file  . Attends BankerClub or Organization Meetings: Not on file  . Marital Status: Not on file   SDOH:  SDOH Screenings   Alcohol Screen:   . Last Alcohol Screening Score (AUDIT): Not on file  Depression (PHQ2-9):   . PHQ-2 Score: Not on file  Financial Resource Strain:   . Difficulty of Paying Living Expenses: Not on file  Food Insecurity:   . Worried About Programme researcher, broadcasting/film/videounning Out of Food in the Last Year: Not on file  . Ran Out of Food in the Last Year: Not on file  Housing:   . Last Housing Risk Score: Not on file  Physical Activity:   . Days of Exercise  per Week: Not on file  . Minutes of Exercise per Session: Not on file  Social Connections:   . Frequency of Communication with Friends and Family: Not on file  . Frequency of Social Gatherings with Friends and Family: Not on file  . Attends Religious Services: Not on file  . Active Member of Clubs or Organizations: Not on file  . Attends BankerClub or Organization Meetings: Not on file  . Marital Status: Not on file  Stress:   . Feeling of Stress : Not on file  Tobacco Use: High Risk  . Smoking Tobacco Use: Current Some Day Smoker  . Smokeless Tobacco Use: Never Used  Transportation Needs:   .  Lack of Transportation (Medical): Not on file  . Lack of Transportation (Non-Medical): Not on file    Has this patient used any form of tobacco in the last 30 days? (Cigarettes, Smokeless Tobacco, Cigars, and/or Pipes)   Current Medications:  Current Facility-Administered Medications  Medication Dose Route Frequency Provider Last Rate Last Admin  . acetaminophen (TYLENOL) tablet 650 mg  650 mg Oral Q6H PRN Jackelyn Poling, NP      . alum & mag hydroxide-simeth (MAALOX/MYLANTA) 200-200-20 MG/5ML suspension 30 mL  30 mL Oral Q4H PRN Nira Conn A, NP      . buprenorphine-naloxone (SUBOXONE) 8-2 mg per SL tablet 1 tablet  1 tablet Sublingual TID Thayne Cindric, Gerlene Burdock, FNP   1 tablet at 04/25/20 1141  . escitalopram (LEXAPRO) tablet 10 mg  10 mg Oral Daily Nira Conn A, NP   10 mg at 04/25/20 0920  . hydrOXYzine (ATARAX/VISTARIL) tablet 25 mg  25 mg Oral TID PRN Jackelyn Poling, NP   25 mg at 04/25/20 0304  . magnesium hydroxide (MILK OF MAGNESIA) suspension 30 mL  30 mL Oral Daily PRN Nira Conn A, NP      . traZODone (DESYREL) tablet 50 mg  50 mg Oral QHS PRN Jackelyn Poling, NP       Current Outpatient Medications  Medication Sig Dispense Refill  . acetaminophen (TYLENOL) 500 MG tablet Take 1,000 mg by mouth every 6 (six) hours as needed for headache (pain).    Marland Kitchen ALPRAZolam (XANAX) 0.5 MG tablet Take  0.5 mg by mouth 3 (three) times daily as needed for anxiety.    . gabapentin (NEURONTIN) 300 MG capsule Take 300 mg by mouth 3 (three) times daily.    Marland Kitchen omeprazole (PRILOSEC) 20 MG capsule Take 1 capsule (20 mg total) by mouth daily.    . QUEtiapine (SEROQUEL) 400 MG tablet Take 400 mg by mouth at bedtime.    . SUBOXONE 8-2 MG FILM Place 1 strip under the tongue 3 (three) times daily.    Marland Kitchen aspirin EC 81 MG EC tablet Take 1 tablet (81 mg total) by mouth daily. (Patient not taking: Reported on 04/25/2020)    . metoCLOPramide (REGLAN) 10 MG tablet Take 1 tablet (10 mg total) by mouth every 8 (eight) hours as needed for nausea. (Patient not taking: Reported on 12/14/2018) 8 tablet 0    PTA Medications: (Not in a hospital admission)   Musculoskeletal  Strength & Muscle Tone: within normal limits Gait & Station: normal Patient leans: N/A  Psychiatric Specialty Exam  Presentation  General Appearance: Disheveled  Eye Contact:Minimal  Speech:Clear and Coherent;Normal Rate  Speech Volume:Decreased  Handedness:Right   Mood and Affect  Mood:Depressed;Hopeless;Worthless  Affect:Congruent;Depressed   Art gallery manager Processes:Coherent  Descriptions of Associations:Intact  Orientation:Full (Time, Place and Person)  Thought Content:WDL  Hallucinations:Hallucinations: None  Ideas of Reference:None  Suicidal Thoughts:Suicidal Thoughts: Yes, Active SI Active Intent and/or Plan: With Intent;With Plan;With Means to Carry Out  Homicidal Thoughts:Homicidal Thoughts: No   Sensorium  Memory:Immediate Good;Recent Good;Remote Good  Judgment:Poor  Insight:Fair   Executive Functions  Concentration:Fair  Attention Span:Fair  Recall:Good  Fund of Knowledge:Good  Language:Good   Psychomotor Activity  Psychomotor Activity:Psychomotor Activity: Normal   Assets  Assets:Communication Skills;Desire for Improvement;Financial Resources/Insurance;Housing;Physical  Health   Sleep  Sleep:Sleep: Fair   Physical Exam  Physical Exam Vitals and nursing note reviewed.  Constitutional:      Appearance: He is well-developed.  Cardiovascular:     Rate  and Rhythm: Normal rate.  Pulmonary:     Effort: Pulmonary effort is normal.  Musculoskeletal:        General: Normal range of motion.  Skin:    General: Skin is warm.  Neurological:     Mental Status: He is alert and oriented to person, place, and time.  Psychiatric:        Mood and Affect: Mood is depressed.        Thought Content: Thought content includes suicidal ideation. Thought content includes suicidal plan.    Review of Systems  Constitutional: Negative.   HENT: Negative.   Eyes: Negative.   Respiratory: Negative.   Cardiovascular: Negative.   Gastrointestinal: Negative.   Genitourinary: Negative.   Musculoskeletal: Negative.   Skin: Negative.   Neurological: Negative.   Endo/Heme/Allergies: Negative.   Psychiatric/Behavioral: Positive for depression and suicidal ideas.   Blood pressure (!) 113/94, pulse 82, temperature 97.7 F (36.5 C), temperature source Temporal, resp. rate 18, SpO2 98 %. There is no height or weight on file to calculate BMI.  Disposition: Discharge to [facility] for inpatient admission  Maryfrances Bunnell, FNP 04/25/2020, 2:28 PM

## 2020-04-25 NOTE — BH Assessment (Signed)
Per Percell Boston , [place], patient has been accepted to [facility], bed 306-1 ; Accepting provider is Dr. Landry Mellow, MD; Attending provider is Dr. Landry Mellow, MD.    Patient can arrive at 4:00pm. Number for report is [phone].   Reola Calkins, NP notified Cecile Sheerer, RN notified   Baldo Daub, MSW, LCSW Clinical Social Worker [facility]

## 2020-04-26 DIAGNOSIS — F332 Major depressive disorder, recurrent severe without psychotic features: Principal | ICD-10-CM

## 2020-04-26 DIAGNOSIS — F1121 Opioid dependence, in remission: Secondary | ICD-10-CM

## 2020-04-26 NOTE — BHH Group Notes (Signed)
Adult Psychoeducational Group Note  Date:  04/26/2020 Time:  12:56 PM  Group Topic/Focus:  Goals Group:   The focus of this group is to help patients establish daily goals to achieve during treatment and discuss how the patient can incorporate goal setting into their daily lives to aide in recovery.  Participation Level:  Did Not Attend   Dione Housekeeper 04/26/2020, 12:56 PM
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
## 2020-04-26 NOTE — Progress Notes (Signed)
Pt is passively suicidal without a plan and verbally contracts for safety. Pt said the thoughts "come and go." Pt said that his goals are to work on his "attitude and temper." Pt said that he's been "fed up with life" Pt said one of his stressors has been trouble getting his disability in order for the past 2 months. Pt said that "everything is going wrong." Pt reports that his disability is under his Mom's name and that he's been trying to contact the social security office to get the issue fixed. Pt said that he's also tried to get in contact with his mother, but has had no luck. Pt also reports that his ex accused him of using meth, but pt said that he's not and her daughter is the one that's been using it. Pt said that he's told his ex, but she's not believing him. Pt said that he's been clean for 4 years and only uses "pot here and there." Pt denies HI and AVH. Active listening, reassurance, and support provided. Q 15 min safety checks continue. Pt's safety has been maintained.    04/25/20 2020  Psych Admission Type (Psych Patients Only)  Admission Status Voluntary  Psychosocial Assessment  Patient Complaints Anger;Depression;Anxiety;Worrying  Eye Contact Avoids;Brief  Facial Expression Anxious  Affect Anxious;Depressed  Speech Soft;Logical/coherent  Interaction Assertive  Motor Activity Slow  Appearance/Hygiene Unremarkable  Behavior Characteristics Cooperative;Anxious;Fidgety  Mood Depressed;Anxious;Sad  Thought Process  Coherency WDL  Content Blaming others;Blaming self  Delusions None reported or observed  Perception WDL  Hallucination None reported or observed  Judgment Impaired  Confusion None  Danger to Self  Current suicidal ideation? Passive  Self-Injurious Behavior No self-injurious ideation or behavior indicators observed or expressed   Agreement Not to Harm Self Yes  Description of Agreement verbally contracts for safety  Danger to Others  Danger to Others None  reported or observed

## 2020-04-26 NOTE — Progress Notes (Signed)
   04/25/20 2020  COVID-19 Daily Checkoff  Have you had a fever (temp > 37.80C/100F)  in the past 24 hours?  No  COVID-19 EXPOSURE  Have you traveled outside the state in the past 14 days? No  Have you been in contact with someone with a confirmed diagnosis of COVID-19 or PUI in the past 14 days without wearing appropriate PPE? No  Have you been living in the same home as a person with confirmed diagnosis of COVID-19 or a PUI (household contact)? No  Have you been diagnosed with COVID-19? No

## 2020-04-26 NOTE — BHH Group Notes (Signed)
LCSW Group Therapy Note  04/26/2020   10:00-11:00am   Type of Therapy and Topic:  Group Therapy: Anger Cues and Responses  Participation Level:  Active   Description of Group:   In this group, patients learned how to recognize the physical, cognitive, emotional, and behavioral responses they have to anger-provoking situations.  They identified a recent time they became angry and how they reacted.  They analyzed how their reaction was possibly beneficial and how it was possibly unhelpful.  The group discussed a variety of healthier coping skills that could help with such a situation in the future.  Focus was placed on how helpful it is to recognize the underlying emotions to our anger, because working on those can lead to a more permanent solution as well as our ability to focus on the important rather than the urgent.  Therapeutic Goals: 1. Patients will remember their last incident of anger and how they felt emotionally and physically, what their thoughts were at the time, and how they behaved. 2. Patients will identify how their behavior at that time worked for them, as well as how it worked against them. 3. Patients will explore possible new behaviors to use in future anger situations. 4. Patients will learn that anger itself is normal and cannot be eliminated, and that healthier reactions can assist with resolving conflict rather than worsening situations.  Summary of Patient Progress:  Patient engaged in group and appeared to be actively listening. Patient did share feeling angry yesterday and meditation is a positive coping strategy.   Therapeutic Modalities:   Cognitive Behavioral Therapy  Shellia Cleverly

## 2020-04-26 NOTE — [facility] Suicide Risk Assessment (Signed)
[facility] Admission Suicide Risk Assessment   Nursing information obtained from:  Patient Demographic factors:  Male Current Mental Status:  Self-harm thoughts Loss Factors:  Loss of significant relationship, Financial problems / change in socioeconomic status Historical Factors:  NA Risk Reduction Factors:  NA  Total Time spent with patient: 30 minutes Principal Problem: <principal problem not specified> Diagnosis:  Active Problems:   Opiate dependence (HCC)  Subjective Data: Patient is seen and examined.  Patient is a 44 year old male with a past psychiatric history significant for polysubstance dependence in remission on maintenance therapy who presented to the [facility] on 04/25/2020 with suicidal ideation.  The patient had been picked up by [facility] police when he was sitting on a bridge.  He had a plan to jump off the bridge and kill himself.  He reported that he had been clean for several years.  He had been on maintenance therapy with buprenorphine and had recently completed parole and remained drug-free except for his maintenance therapy and marijuana.  He stated that her niece her cousin in the home had apparently been abusing methamphetamines.  He lives with his family given medical issues as well as his limitations and working.  Per his report they blamed him for her drug use and asked him to leave.  He is currently homeless.  He denied any drug use, and admitted that he had been treated with antidepressants in the past.  He stated his last psychiatric hospitalization was at our facility in 2017.  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  There was an unspecified depressive disorder.  He was discharged on hydroxyzine.  He stated that he has been on several antidepressants in the past, and "they made things worse".  He feels helpless, hopeless and worthless as well as powerless over the situation.  His drug screen  on admission was positive for marijuana and buprenorphine but no other substances.  He was admitted to the hospital for evaluation and stabilization.  Continued Clinical Symptoms:  Alcohol Use Disorder Identification Test Final Score (AUDIT): 0 The "Alcohol Use Disorders Identification Test", Guidelines for Use in Primary Care, Second Edition.  [employer] [facility]). Score between 0-7:  no or low risk or alcohol related problems. Score between 8-15:  moderate risk of alcohol related problems. Score between 16-19:  high risk of alcohol related problems. Score 20 or above:  warrants further diagnostic evaluation for alcohol dependence and treatment.   CLINICAL FACTORS:   Depression:   Anhedonia Hopelessness Impulsivity Insomnia Alcohol/Substance Abuse/Dependencies   Musculoskeletal: Strength & Muscle Tone: within normal limits Gait & Station: normal Patient leans: N/A  Psychiatric Specialty Exam: Physical Exam Vitals and nursing note reviewed.  Constitutional:      Appearance: Normal appearance.  HENT:     Head: Normocephalic and atraumatic.  Pulmonary:     Effort: Pulmonary effort is normal.  Neurological:     General: No focal deficit present.     Mental Status: He is alert and oriented to person, place, and time.     Review of Systems  Blood pressure (!) 108/93, pulse 87, temperature 98.6 F (37 C), temperature source Oral, resp. rate 18, height 6' (1.829 m), weight 63.5 kg, SpO2 97 %.Body mass index is 18.99 kg/m.  General Appearance: Casual  Eye Contact:  Minimal  Speech:  Normal Rate  Volume:  Decreased  Mood:  Depressed  Affect:  Congruent  Thought Process:  Coherent and  Descriptions of Associations: Intact  Orientation:  Full (Time, Place, and Person)  Thought Content:  Logical  Suicidal Thoughts:  Yes.  with intent/plan  Homicidal Thoughts:  No  Memory:  Immediate;   Fair Recent;   Fair Remote;   Fair  Judgement:  Intact  Insight:  Fair   Psychomotor Activity:  Decreased  Concentration:  Concentration: Fair and Attention Span: Fair  Recall:  [employer] of Knowledge:  Fair  Language:  Good  Akathisia:  Negative  Handed:  Right  AIMS (if indicated):     Assets:  Desire for Improvement Resilience  ADL's:  Intact  Cognition:  WNL  Sleep:  Number of Hours: 6.75      COGNITIVE FEATURES THAT CONTRIBUTE TO RISK:  None    SUICIDE RISK:   Moderate:  Frequent suicidal ideation with limited intensity, and duration, some specificity in terms of plans, no associated intent, good self-control, limited dysphoria/symptomatology, some risk factors present, and identifiable protective factors, including available and accessible social support.  PLAN OF CARE: Patient is seen and examined.  Patient is a 44 year old male with the above-stated past psychiatric history is admitted for worsening depression and suicidal ideation.  He will be admitted to the hospital.  He will be integrated in the milieu.  He will be encouraged to attend groups.  He currently is refusing antidepressant medicines but has in the past that have "may be worse".  We will monitor him for the next 24 hours.  He has already been placed on the opiate detox protocol, but at least according to the patient has been compliant with his buprenorphine treatment.  We will continue the 8 mg 3 times daily but Arfeen treatment.  He has a history of a mitral valve replacement.  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  He also has some gastric issues and has been on Reglan as well as Protonix in the past.  We will continue those.  Review of his admission laboratories revealed normal electrolytes including liver function enzymes.  His white blood cell count was slightly elevated at 12.9.  The rest of his CBC was normal.  His neutrophil count was slightly elevated at 10.2.  TSH was normal at 2.481.  Drug screen again was positive for buprenorphine  and marijuana.  His blood pressure is 108/93.  Pulse is 87.  He is afebrile.  Pulse oximetry on room air was 97%.  I certify that inpatient services furnished can reasonably be expected to improve the patient's condition.   Antonieta Pert, MD 04/26/2020, 10:50 AM

## 2020-04-26 NOTE — H&P (Signed)
Psychiatric Admission Assessment Adult  Patient Identification: Steven Montoya.  MRN:  [id number]  Date of Evaluation:  04/26/2020  Chief Complaint: Worsening symptoms of depression triggering suicidal ideations with plan to jump off a bridge.   Principal Diagnosis: Major depressive disorder, recurrent episode, severe (HCC)  Diagnosis:  Principal Problem:   Major depressive disorder, recurrent episode, severe (HCC) Active Problems:   Opiate dependence ([place])  History of Present Illness: (Per Md's admission SRA notes): Patient is seen and examined.  Patient is a 44 year old male with a past psychiatric history significant for polysubstance dependence in remission on maintenance therapy who presented to the [facility] on 04/25/2020 with suicidal ideation.  The patient had been picked up by [facility] police when he was sitting on a bridge.  He had a plan to jump off the bridge and kill himself.  He reported that he had been clean for several years.  He had been on maintenance therapy with buprenorphine and had recently completed parole and remained drug-free except for his maintenance therapy and marijuana.  He stated that her niece her cousin in the home had apparently been abusing methamphetamines.  He lives with his family given medical issues as well as his limitations and working.  Per his report they blamed him for her drug use and asked him to leave.  He is currently homeless.  He denied any drug use, and admitted that he had been treated with antidepressants in the past.  He stated his last psychiatric hospitalization was at our facility in 2017.  Associated Signs/Symptoms:  Depression Symptoms:  depressed mood, insomnia, feelings of worthlessness/guilt, hopelessness, suicidal thoughts with specific plan, anxiety,  (Hypo) Manic Symptoms:  Impulsivity, Irritable Mood, Labiality of Mood,  Anxiety Symptoms:  Excessive Worry,  Psychotic Symptoms:   Denies any hallucinations, delusions or paranoia  PTSD Symptoms: NA  Total Time spent with patient: 1 hour  Past Psychiatric History: Polysubstance use disorder, Major depressive disorder.  Is the patient at risk to self? Yes.    Has the patient been a risk to self in the past 6 months? Yes.    Has the patient been a risk to self within the distant past? Yes.    Is the patient a risk to others? No.  Has the patient been a risk to others in the past 6 months? No.  Has the patient been a risk to others within the distant past? No.   Prior Inpatient Therapy: Yes [facility] in 2017). Prior Outpatient Therapy: [facility].  Alcohol Screening: 1. How often do you have a drink containing alcohol?: Never 2. How many drinks containing alcohol do you have on a typical day when you are drinking?: 1 or 2 3. How often do you have six or more drinks on one occasion?: Never AUDIT-C Score: 0 4. How often during the last year have you found that you were not able to stop drinking once you had started?: Never 5. How often during the last year have you failed to do what was normally expected from you because of drinking?: Never 6. How often during the last year have you needed a first drink in the morning to get yourself going after a heavy drinking session?: Never 7. How often during the last year have you had a feeling of guilt of remorse after drinking?: Never 8. How often during the last year have you been unable to remember what happened the night before because you had been drinking?: Never  9. Have you or someone else been injured as a result of your drinking?: No 10. Has a relative or friend or a doctor or another health worker been concerned about your drinking or suggested you cut down?: No Alcohol Use Disorder Identification Test Final Score (AUDIT): 0  Substance Abuse History in the last 12 months:  Yes.    Consequences of Substance Abuse: Medical Consequences:  Liver damage, Possible death by  overdose Legal Consequences:  Arrests, jail time, Loss of driving privilege. Family Consequences:  Family discord, divorce and or separation.  Previous Psychotropic Medications: Xanax, Seroquel  Psychological Evaluations: 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   Complex valvuloplasty including triangular resection of posterior leaflet, artificial Goretex neochords x6 and 34 mm Sorin Memo 3D ring annuloplasty via right mini thoracotomy approach   Urgency of urination    Urinary straining     Past Surgical History:  Procedure Laterality Date   CARDIAC SURGERY     COLONOSCOPY N/A 08/17/2013   Procedure: COLONOSCOPY;  Surgeon: Leighton Ruff, MD;  Location: [facility];  Service: Endoscopy;  Laterality: N/A;   CYSTO/ RETROGRADE PYELOGRAM/ URETHRAL DILATION  11-05-2010   MITRAL VALVE REPAIR Right 09/07/2017   Procedure: MINIMALLY INVASIVE MITRAL VALVE REPAIR (MVR) using Sorin Memo 3D Ring size 34;  Surgeon: Rexene Alberts, MD;  Location: [place];  Service: Open Heart Surgery;  Laterality: Right;   RIGHT/LEFT HEART CATH AND CORONARY ANGIOGRAPHY N/A 07/22/2017   Procedure: RIGHT/LEFT HEART CATH AND CORONARY ANGIOGRAPHY;  Surgeon: Sherren Mocha, MD;  Location: [place] [facility];  Service: Cardiovascular;  Laterality: N/A;   TEE WITHOUT CARDIOVERSION N/A 06/28/2017   Procedure: TRANSESOPHAGEAL ECHOCARDIOGRAM (TEE);  Surgeon: Jerline Pain, MD;  Location: [facility] END[facility]COPY;  Service: Cardiovascular;  Laterality: N/A;   TEE WITHOUT CARDIOVERSION N/A 09/07/2017   Procedure: TRANSESOPHAGEAL ECHOCARDIOGRAM (TEE);  Surgeon: Rexene Alberts, MD;  Location: [place];  Service:  Open Heart Surgery;  Laterality: N/A;   TOOTH EXTRACTION     canines   Family History:  Family History  Problem Relation Age of Onset   Heart disease Father    Family Psychiatric  History: Denies any hx of mental illness in his family. Denies any familial hx of drug addiction.  Tobacco Screening: Denies.  Social History:  Social History   Substance and Sexual Activity  Alcohol Use Yes   Comment: Using for 25 years     Social History   Substance and Sexual Activity  Drug Use Not Currently   Types: Marijuana, "Crack" cocaine, Methamphetamines   Comment: pt is in jail    Additional Social History:  Allergies:   Allergies  Allergen Reactions   Latex Other (See Comments)    HX SUPERFICIAL THROMBOPHLEBITIS LEFT FOREARM AFTER IV IN APR 2011   Adhesive [Tape] Rash   Lab Results:  Results for orders placed or performed during the hospital encounter of 04/24/20 (from the past 48 hour(s))  SARS Coronavirus 2 by RT PCR (hospital order, performed in [facility] hospital lab) Nasopharyngeal Nasopharyngeal Swab     Status: None  Collection Time: 04/24/20  8:45 PM   Specimen: Nasopharyngeal Swab  Result Value Ref Range   SARS Coronavirus 2 NEGATIVE NEGATIVE    Comment: (NOTE) SARS-CoV-2 target nucleic acids are NOT DETECTED.  The SARS-CoV-2 RNA is generally detectable in upper and lower respiratory specimens during the acute phase of infection. The lowest concentration of SARS-CoV-2 viral copies this assay can detect is 250 copies / mL. A negative result does not preclude SARS-CoV-2 infection and should not be used as the sole basis for treatment or other patient management decisions.  A negative result may occur with improper specimen collection / handling, submission of specimen other than nasopharyngeal swab, presence of viral mutation(s) within the areas targeted by this assay, and inadequate number of viral copies (<250 copies / mL). A negative result must be  combined with clinical observations, patient history, and epidemiological information.  Fact Sheet for Patients:   StrictlyIdeas.no  Fact Sheet for Healthcare Providers: BankingDealers.co.za  This test is not yet approved or  cleared by the [place] FDA and has been authorized for detection and/or diagnosis of SARS-CoV-2 by FDA under an Emergency Use Authorization (EUA).  This EUA will remain in effect (meaning this test can be used) for the duration of the COVID-19 declaration under Section 564(b)(1) of the Act, 21 U.S.C. section 360bbb-3(b)(1), unless the authorization is terminated or revoked sooner.  Performed at [facility], [place] [address], [place], [place] [address]   POC SARS Coronavirus 2 Ag-ED - Nasal Swab (BD Veritor Kit)     Status: Normal   Collection Time: 04/24/20  8:45 PM  Result Value Ref Range   SARS Coronavirus 2 Ag Negative Negative  CBC with Differential/Platelet     Status: Abnormal   Collection Time: 04/24/20  8:45 PM  Result Value Ref Range   WBC 12.9 (H) 4.0 - 10.5 K/uL   RBC 5.45 4.22 - 5.81 MIL/uL   Hemoglobin 16.4 13.0 - 17.0 g/dL   HCT 49.3 39 - 52 %   MCV 90.5 80.0 - 100.0 fL   MCH 30.1 26.0 - 34.0 pg   MCHC 33.3 30.0 - 36.0 g/dL   RDW 14.7 11.5 - 15.5 %   Platelets 364 150 - 400 K/uL   nRBC 0.0 0.0 - 0.2 %   Neutrophils Relative % 79 %   Neutro Abs 10.2 (H) 1.7 - 7.7 K/uL   Lymphocytes Relative 15 %   Lymphs Abs 2.0 0.7 - 4.0 K/uL   Monocytes Relative 5 %   Monocytes Absolute 0.6 0 - 1 K/uL   Eosinophils Relative 0 %   Eosinophils Absolute 0.0 0 - 0 K/uL   Basophils Relative 1 %   Basophils Absolute 0.1 0 - 0 K/uL   Immature Granulocytes 0 %   Abs Immature Granulocytes 0.04 0.00 - 0.07 K/uL    Comment: Performed at [facility], [place] [address] [place], [place] [address]  Comprehensive metabolic panel     Status: Abnormal   Collection Time: 04/24/20  8:45 PM  Result Value  Ref Range   Sodium 136 135 - 145 mmol/L   Potassium 3.8 3.5 - 5.1 mmol/L   Chloride 100 98 - 111 mmol/L   CO2 24 22 - 32 mmol/L   Glucose, Bld 93 70 - 99 mg/dL    Comment: Glucose reference range applies only to samples taken after fasting for at least 8 hours.   BUN 15 6 - 20 mg/dL   Creatinine, Ser 1.15  0.61 - 1.24 mg/dL   Calcium 9.5 8.9 - 10.3 mg/dL   Total Protein 6.2 (L) 6.5 - 8.1 g/dL   Albumin 3.9 3.5 - 5.0 g/dL   AST 23 15 - 41 U/L   ALT 14 0 - 44 U/L   Alkaline Phosphatase 42 38 - 126 U/L   Total Bilirubin 0.9 0.3 - 1.2 mg/dL   GFR calc non Af Amer >60 >60 mL/min   GFR calc Af Amer >60 >60 mL/min   Anion gap 12 5 - 15    Comment: Performed at [facility] [address] [place], [place] [address]  TSH     Status: None   Collection Time: 04/24/20  8:45 PM  Result Value Ref Range   TSH 2.481 0.350 - 4.500 uIU/mL    Comment: Performed by a 3rd Generation assay with a functional sensitivity of <=0.01 uIU/mL. Performed at [facility], [place] [address] [place], [place] [address]   POCT Urine Drug Screen - (ICup)     Status: Abnormal   Collection Time: 04/24/20  9:05 PM  Result Value Ref Range   POC Amphetamine UR None Detected None Detected   POC Secobarbital (BAR) None Detected None Detected   POC Buprenorphine (BUP) Positive (A) None Detected   POC Oxazepam (BZO) None Detected None Detected   POC Cocaine UR None Detected None Detected   POC Methamphetamine UR None Detected None Detected   POC Morphine None Detected None Detected   POC Oxycodone UR None Detected None Detected   POC Methadone UR None Detected None Detected   POC Marijuana UR Positive (A) None Detected  POC SARS Coronavirus 2 Ag     Status: None   Collection Time: 04/24/20  9:12 PM  Result Value Ref Range   SARS Coronavirus 2 Ag NEGATIVE NEGATIVE    Comment: (NOTE) SARS-CoV-2 antigen NOT DETECTED.   Negative results are presumptive.  Negative results do not preclude SARS-CoV-2  infection and should not be used as the sole basis for treatment or other patient management decisions, including infection  control decisions, particularly in the presence of clinical signs and  symptoms consistent with COVID-19, or in those who have been in contact with the virus.  Negative results must be combined with clinical observations, patient history, and epidemiological information. The expected result is Negative.  Fact Sheet for Patients: PodPark.tn  Fact Sheet for Healthcare Providers: GiftContent.is   This test is not yet approved or cleared by the [place] FDA and  has been authorized for detection and/or diagnosis of SARS-CoV-2 by FDA under an Emergency Use Authorization (EUA).  This EUA will remain in effect (meaning this test can be used) for the duration of  the C OVID-19 declaration under Section 564(b)(1) of the Act, 21 U.S.C. section 360bbb-3(b)(1), unless the authorization is terminated or revoked sooner.     Blood Alcohol level:  Lab Results  Component Value Date   ETH <10 01/03/2020   ETH <10 84/72/0721   Metabolic Disorder Labs:  Lab Results  Component Value Date   HGBA1C 5.0 09/05/2017   MPG 96.8 09/05/2017   No results found for: PROLACTIN No results found for: CHOL, TRIG, HDL, CHOLHDL, VLDL, LDLCALC  Current Medications: Current Facility-Administered Medications  Medication Dose Route Frequency Provider Last Rate Last Admin   acetaminophen (TYLENOL) tablet 650 mg  650 mg Oral Q6H PRN Sharma Covert, MD       alum & mag hydroxide-simeth (MAALOX/MYLANTA) 200-200-20 MG/5ML  suspension 30 mL  30 mL Oral Q4H PRN Sharma Covert, MD       buprenorphine-naloxone (SUBOXONE) 8-2 mg per SL tablet 1 tablet  1 tablet Sublingual TID Sharma Covert, MD   1 tablet at 04/26/20 1146   dicyclomine (BENTYL) tablet 20 mg  20 mg Oral Q6H PRN Sharma Covert, MD       hydrOXYzine  (ATARAX/VISTARIL) tablet 25 mg  25 mg Oral Q6H PRN Sharma Covert, MD   25 mg at 04/25/20 2130   loperamide (IMODIUM) capsule 2-4 mg  2-4 mg Oral PRN Sharma Covert, MD       LORazepam (ATIVAN) tablet 1 mg  1 mg Oral Q6H PRN Sharma Covert, MD       magnesium hydroxide (MILK OF MAGNESIA) suspension 30 mL  30 mL Oral Daily PRN Sharma Covert, MD       methocarbamol (ROBAXIN) tablet 500 mg  500 mg Oral Q8H PRN Sharma Covert, MD       metoCLOPramide (REGLAN) tablet 10 mg  10 mg Oral TID AC Sharma Covert, MD   10 mg at 04/26/20 1146   naproxen (NAPROSYN) tablet 500 mg  500 mg Oral BID PRN Sharma Covert, MD       ondansetron (ZOFRAN-ODT) disintegrating tablet 4 mg  4 mg Oral Q6H PRN Sharma Covert, MD       pantoprazole (PROTONIX) EC tablet 40 mg  40 mg Oral Daily Sharma Covert, MD   40 mg at 04/26/20 4076   traZODone (DESYREL) tablet 50 mg  50 mg Oral QHS PRN Sharma Covert, MD       PTA Medications: Medications Prior to Admission  Medication Sig Dispense Refill Last Dose   acetaminophen (TYLENOL) 500 MG tablet Take 1,000 mg by mouth every 6 (six) hours as needed for headache (pain).      ALPRAZolam (XANAX) 0.5 MG tablet Take 0.5 mg by mouth 3 (three) times daily as needed for anxiety.      aspirin EC 81 MG EC tablet Take 1 tablet (81 mg total) by mouth daily. (Patient not taking: Reported on 04/25/2020)      gabapentin (NEURONTIN) 300 MG capsule Take 300 mg by mouth 3 (three) times daily.      metoCLOPramide (REGLAN) 10 MG tablet Take 1 tablet (10 mg total) by mouth every 8 (eight) hours as needed for nausea. (Patient not taking: Reported on 12/14/2018) 8 tablet 0    omeprazole (PRILOSEC) 20 MG capsule Take 1 capsule (20 mg total) by mouth daily.      QUEtiapine (SEROQUEL) 400 MG tablet Take 400 mg by mouth at bedtime.      SUBOXONE 8-2 MG FILM Place 1 strip under the tongue 3 (three) times daily.      Musculoskeletal: Strength & Muscle  Tone: within normal limits Gait & Station: normal Patient leans: N/A  Psychiatric Specialty Exam: Physical Exam Vitals and nursing note reviewed.  HENT:     Head: Normocephalic.     Nose: Nose normal.     Mouth/Throat:     Pharynx: Oropharynx is clear.  Eyes:     Pupils: Pupils are equal, round, and reactive to light.  Cardiovascular:     Rate and Rhythm: Normal rate.     Pulses: Normal pulses.  Pulmonary:     Effort: Pulmonary effort is normal.  Musculoskeletal:     Cervical back: Normal range of motion.  Neurological:  Mental Status: He is alert.     Review of Systems  Constitutional: Negative for chills, diaphoresis and fever.  HENT: Negative for congestion, rhinorrhea, sneezing and sore throat.   Eyes: Negative for discharge.  Respiratory: Negative for cough, chest tightness, shortness of breath and wheezing.   Cardiovascular: Negative for chest pain and palpitations.  Gastrointestinal: Negative for diarrhea, nausea and vomiting.  Endocrine: Negative for cold intolerance.  Genitourinary: Negative for difficulty urinating.  Musculoskeletal: Negative for arthralgias and myalgias.  Skin: Negative.   Allergic/Immunologic: Negative for environmental allergies and food allergies.       Allergies: latex, Adhesive tape  Neurological: Negative for dizziness, tremors, seizures, syncope, speech difficulty, light-headedness and headaches.  Psychiatric/Behavioral: Positive for dysphoric mood, sleep disturbance and suicidal ideas. Negative for agitation, behavioral problems, confusion, decreased concentration, hallucinations and self-injury. The patient is nervous/anxious. The patient is not hyperactive.     Blood pressure (!) 108/93, pulse 87, temperature 98.6 F (37 C), temperature source Oral, resp. rate 18, height 6' (1.829 m), weight 63.5 kg, SpO2 97 %.Body mass index is 18.99 kg/m.  General Appearance: Casual  Eye Contact:  Minimal  Speech:  Normal Rate  Volume:   Decreased  Mood:  Depressed  Affect:  Congruent  Thought Process:  Coherent and Descriptions of Associations: Intact  Orientation:  Full (Time, Place, and Person)  Thought Content:  Logical  Suicidal Thoughts:  Yes.  with intent/plan  Homicidal Thoughts:  No  Memory:  Immediate;   Fair Recent;   Fair Remote;   Fair  Judgement:  Intact  Insight:  Fair  Psychomotor Activity:  Decreased  Concentration:  Concentration: Fair and Attention Span: Fair  Recall:  [employer] of Knowledge:  Fair  Language:  Good  Akathisia:  Negative  Handed:  Right  AIMS (if indicated):     Assets:  Desire for Improvement Resilience  ADL's:  Intact  Cognition:  WNL    Sleep:  Number of Hours: 6.75   Treatment Plan Summary: Daily contact with patient to assess and evaluate symptoms and progress in treatment and Medication management.  Treatment Plan/Recommendations: 1. Admit for crisis management and stabilization, estimated length of stay 3-5 days.  2. Medication management to reduce current symptoms to base line and improve the patient's overall level of functioning: See [facility] for plan of care. 3. Treat health problems as indicated.  4. Develop treatment plan to decrease risk of relapse upon discharge and the need for readmission.  5. Psycho-social education regarding relapse prevention and self care.  6. Health care follow up as needed for medical problems.  7. Review, reconcile, and reinstate any pertinent home medications for other health issues where appropriate. 8. Call for consults with hospitalist for any additional specialty patient care services as needed.  Observation Level/Precautions:  15 minute checks  Laboratory:  Per ED, UDS (+) for Buprenorphine, THC  Psychotherapy: Group sessions  Medications: See MAR    Consultations: As needed   Discharge Concerns: Safety, mood stability   Estimated LOS: 2-4 days  Other: Admit to the 300-hall   Physician Treatment Plan for Primary Diagnosis:  Major depressive disorder, recurrent episode, severe ([place])  [place] Term Goal(s): Improvement in symptoms so as ready for discharge  Short Term Goals: Ability to identify changes in lifestyle to reduce recurrence of condition will improve, Ability to verbalize feelings will improve and Ability to disclose and discuss suicidal ideas  Physician Treatment Plan for Secondary Diagnosis: Principal Problem:   Major depressive disorder,  recurrent episode, severe ([place]) Active Problems:   Opiate dependence ([place])  [place] Term Goal(s): Improvement in symptoms so as ready for discharge  Short Term Goals: Ability to demonstrate self-control will improve, Ability to identify and develop effective coping behaviors will improve, Compliance with prescribed medications will improve and Ability to identify triggers associated with substance abuse/mental health issues will improve  I certify that inpatient services furnished can reasonably be expected to improve the patient's condition.    Lindell Spar, NP, PMHNP, FNP-BC 8/28/202112:43 PM

## 2020-04-26 NOTE — Progress Notes (Signed)
[facility] Group Notes:  (Nursing/MHT/Case Management/Adjunct)  Date:  04/26/2020  Time:  2030  Type of Therapy:  wrap up group  Participation Level:  Active  Participation Quality:  Appropriate, Attentive, Sharing and Supportive  Affect:  Depressed  Cognitive:  Appropriate  Insight:  Improving  Engagement in Group:  Engaged  Modes of Intervention:  Clarification, Education and Support  Summary of Progress/Problems: Positive thinking and positive change were discussed.   Johann Capers S 04/26/2020, 10:10 PM

## 2020-04-26 NOTE — Progress Notes (Signed)
D: Patient states he is depressed with intermittent suicidal ideation. His goal is to "work on my temper."  He rates his depression, anxiety and hopelessness as a 10. His sleep and concentration are "poor." Patient is pleasant with staff. He did attend one group today.  A: Continue to monitor medication management and MD orders.  Safety checks completed every 15 minutes per protocol.  Offer support and encouragement as needed.  R: Patient is receptive to staff; his behavior is appropriate.

## 2020-04-27 ENCOUNTER — Encounter (HOSPITAL_COMMUNITY): Payer: Self-pay | Admitting: Psychiatry

## 2020-04-27 MED ORDER — MIRTAZAPINE 15 MG PO TABS
15.0000 mg | ORAL_TABLET | Freq: Every day | ORAL | Status: DC
  Administered 2020-04-27 – 2020-04-28 (×2): 15 mg via ORAL
  Filled 2020-04-27 (×5): qty 1

## 2020-04-27 MED ORDER — ENSURE ENLIVE PO LIQD
237.0000 mL | Freq: Three times a day (TID) | ORAL | Status: DC
  Administered 2020-04-27 – 2020-04-28 (×5): 237 mL via ORAL

## 2020-04-27 NOTE — [facility] Group Notes (Signed)
[facility] LCSW Group Therapy Note  Date/Time:  04/27/20  10:00AM-11:00AM  Type of Therapy and Topic:  Group Therapy:  Music and Mood  Participation Level:   Description of Group: In this process group, members listened to a variety of genres of music and identified that different types of music evoke different responses.  Patients were encouraged to identify music that was soothing for them and music that was energizing for them.  Patients discussed how this knowledge can help with wellness and recovery in various ways including managing depression and anxiety as well as encouraging healthy sleep habits.    Therapeutic Goals: 1. Patients will explore the impact of different varieties of music on mood 2. Patients will verbalize the thoughts they have when listening to different types of music 3. Patients will identify music that is soothing to them as well as music that is energizing to them 4. Patients will discuss how to use this knowledge to assist in maintaining wellness and recovery 5. Patients will explore the use of music as a coping skill with an emphasis on it being a relaxation skill 6. Patient were given a handout with two additional coping strategies: challenging negative thinking and imagery 7. Patients were given a log and challenged to practice a relaxation skill 4-5 times a week  Summary of Patient Progress:  Patient was present and appeared to be actively listening when in the room. Patient was pulled out by staff. Patient sat quietly with head in his lap.   Therapeutic Modalities: Solution Focused Brief Therapy Activity  Raeanne Gathers, LCSW

## 2020-04-27 NOTE — Progress Notes (Signed)
DAR NOTE: Patient presents with anxious affect and depressed mood.  Denies pain, auditory and visual hallucinations. Maintained on routine safety checks.  Medications given as prescribed.  Support and encouragement offered as needed. Will continue to monitor. 

## 2020-04-27 NOTE — [facility] Suicide Risk Assessment (Signed)
[facility] INPATIENT:  Family/Significant Other Suicide Prevention Education  Suicide Prevention Education:  Education Completed; Deborah Chalk wife [phone],  (name of family member/significant other) has been identified by the patient as the family member/significant other with whom the patient will be residing, and identified as the person(s) who will aid the patient in the event of a mental health crisis (suicidal ideations/suicide attempt).  With written consent from the patient, the family member/significant other has been provided the following suicide prevention education, prior to the and/or following the discharge of the patient.  The suicide prevention education provided includes the following:  Suicide risk factors  Suicide prevention and interventions  National Suicide Hotline telephone number  [facility] assessment telephone number  [facility] Emergency Assistance 911  [facility] and/or Residential Mobile Crisis Unit telephone number  Request made of family/significant other to:  Remove weapons (e.g., guns, rifles, knives), all items previously/currently identified as safety concern.    Remove drugs/medications (over-the-counter, prescriptions, illicit drugs), all items previously/currently identified as a safety concern.  The family member/significant other verbalizes understanding of the suicide prevention education information provided.  The family member/significant other agrees to remove the items of safety concern listed above.  Wife reports that "it's hard to say if he is doing better but he sounded depressed when I talked to him yesterday but it could be because he is there". Wife reports there is a gun at the home but it's locked up and he does not know where it is. Wife reports this is the second time that he has attempted suicide or did something like this. Wife shared his using is a major problem and that it is out of hand to the point that his  meth use is about to end our marriage. Wife reports he is stealing and lying and he hears you but does not listen. Wife reports he knows how to get around and pass drug tests. Wife reports she will let him come home because she knows he is not been using while he is with [place] but that if he starts using meth again that she will put him out.   Shellia Cleverly 04/27/2020, 3:27 PM

## 2020-04-27 NOTE — Progress Notes (Signed)
Steven Montoya  04/27/2020 2:18 PM Steven R Vanzile Jr.  MRN:  [id number]  Subjective: Steven [facility]oya reports, "I feel kind of spaced out this morning. I have no appetite. I feel weak. I feel kind of very depressed & it has been going on for a while".  Objective:Patient is a 44 year old male with a past psychiatric history significant for polysubstance dependence in remission on maintenance therapy who presented to the [facility] on 04/25/2020 with suicidal ideation. The [facility] [place] he was [place] bridge. He had a plan to jump off the bridge and kill himself. He reported that he had been clean for several years. He had been on maintenance therapy with buprenorphine and had recently completed parole and remained drug-free except for his maintenance therapy and marijuana. He stated that her niece her cousin in the home had apparently been abusing methamphetamines. Steven Montoya is seen, chart reviewed. The chart findings discussed with the treatment. He presents alert, oriented & visible on the unit. He is attending group sessions. He says today that he is feeling like he is spaced out. Complained of lack of appetite & feeling weak. He is endorsing lingering symptoms of depression that he says he can't seem to shake off. A review of his treatment plan does not include antidepressant other than Trazodone which at this time is being used for insomnia on a prn basis. Discussed with patient a trial on Mirtazapine 15 mg po for depression & appetite stimulation. He is agreement to try this medication starting tonight. Initiated Ensure 1 can tid in between meals. He currently denies any SIHI, AVH, delusional thoughts or paranoia. He does not appear to be responding to any internal stimuli.  Principal Problem: Major depressive disorder, recurrent episode, severe (HCC)  Diagnosis: Principal Problem:   Major depressive disorder, recurrent episode,  severe (HCC) Active Problems:   Opiate dependence (HCC)  Total Time spent with patient: 25 minutes  Past Psychiatric History: See H&P  Past Medical History:  Past Medical History:  Diagnosis Date  . ADHD   . Anxiety   . Chronic urethral stricture   . Depression   . Diverticulitis   . Frequency of urination   . Headache(784.0)   . Lower abdominal pain   . Mitral valve prolapse   . Nocturia   . PTSD (post-traumatic stress disorder)   . 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 Urgency of urination   . Urinary straining     Past Surgical History:  Procedure Laterality Date  . CARDIAC SURGERY    . COLONOSCOPY N/A 08/17/2013   Procedure: COLONOSCOPY;  Surgeon: Romie LeveeAlicia Thomas, MD;  Location: [facility]COPY;  Service: Endoscopy;  Laterality: N/A;  . CYSTO/ RETROGRADE PYELOGRAM/ URETHRAL DILATION  11-05-2010  . MITRAL VALVE REPAIR Right 09/07/2017   Procedure: MINIMALLY INVASIVE MITRAL VALVE REPAIR (MVR) using Sorin Memo 3D Ring size 34;  Surgeon: Purcell Nailswen, Clarence H, MD;  Location: [facility] OR;  Service: Open Heart Surgery;  Laterality: Right;  . RIGHT/LEFT HEART CATH AND CORONARY ANGIOGRAPHY N/A 07/22/2017   Procedure: RIGHT/LEFT HEART CATH AND CORONARY ANGIOGRAPHY;  Surgeon: Tonny Bollmanooper, Michael, MD;  [facility];  Service: Cardiovascular;  Laterality: N/A;  . TEE WITHOUT CARDIOVERSION N/A 06/28/2017   Procedure: TRANSESOPHAGEAL ECHOCARDIOGRAM (TEE);  Surgeon:  Jake Bathe, MD;  Location: [facility] ENDOSCOPY;  Service: Cardiovascular;  Laterality: N/A;  . TEE WITHOUT CARDIOVERSION N/A 09/07/2017   Procedure: TRANSESOPHAGEAL ECHOCARDIOGRAM (TEE);  Surgeon: Purcell Nails, MD;  Location: [facility] OR;  Service: Open Heart Surgery;  Laterality: N/A;  . TOOTH EXTRACTION     canines   Family History:   Family History  Problem Relation Age of Onset  . Heart disease Father    Family Psychiatric  History: See H&P  Social History:  Social History   Substance and Sexual Activity  Alcohol Use Yes   Comment: Using for 25 years     Social History   Substance and Sexual Activity  Drug Use Not Currently  . Types: Marijuana, "Crack" cocaine, Methamphetamines   Comment: pt is in jail    Social History   Socioeconomic History  . Marital status: Married    Spouse name: Not on file  . Number of children: Not on file  . Years of education: Not on file  . Highest education level: Not on file  Occupational History  . Not on file  Tobacco Use  . Smoking status: Current Some Day Smoker  . Smokeless tobacco: Never Used  Substance and Sexual Activity  . Alcohol use: Yes    Comment: Using for 25 years  . Drug use: Not Currently    Types: Marijuana, "Crack" cocaine, Methamphetamines    Comment: pt is in jail  . Sexual activity: Not on file  Other Topics Concern  . Not on file  Social History Narrative   ** Merged History Encounter **       Social Determinants of Health   Financial Resource Strain:   . Difficulty of Paying Living Expenses: Not on file  Food Insecurity:   . Worried About Programme researcher, broadcasting/film/video in the Last Year: Not on file  . Ran Out of Food in the Last Year: Not on file  Transportation Needs:   . Lack of Transportation (Medical): Not on file  . Lack of Transportation (Non-Medical): Not on file  Physical Activity:   . Days of Exercise per Week: Not on file  . Minutes of Exercise per Session: Not on file  Stress:   . Feeling of Stress : Not on file  Social Connections:   . Frequency of Communication with Friends and Family: Not on file  . Frequency of Social Gatherings with Friends and Family: Not on file  . Attends Religious Services: Not on file  . Active Member of Clubs or Organizations: Not on file  . Attends Banker Meetings: Not on file  .  Marital Status: Not on file   Additional Social History:   Sleep: Good  Appetite:  Good  Current Medications: Current Facility-Administered Medications  Medication Dose Route Frequency Provider Last Rate Last Admin  . acetaminophen (TYLENOL) tablet 650 mg  650 mg Oral Q6H PRN Antonieta Pert, MD      . alum & mag hydroxide-simeth (MAALOX/MYLANTA) 200-200-20 MG/5ML suspension 30 mL  30 mL Oral Q4H PRN Antonieta Pert, MD      . buprenorphine-naloxone (SUBOXONE) 8-2 mg per SL tablet 1 tablet  1 tablet Sublingual TID Antonieta Pert, MD   1 tablet at 04/27/20 1155  . dicyclomine (BENTYL) tablet 20 mg  20 mg Oral Q6H PRN Antonieta Pert, MD      . feeding supplement (ENSURE ENLIVE) (ENSURE ENLIVE) liquid 237 mL  237 mL Oral TID BM Sanjuana Kava,  NP      . hydrOXYzine (ATARAX/VISTARIL) tablet 25 mg  25 mg Oral Q6H PRN Antonieta Pert, MD   25 mg at 04/26/20 2121  . loperamide (IMODIUM) capsule 2-4 mg  2-4 mg Oral PRN Antonieta Pert, MD      . LORazepam (ATIVAN) tablet 1 mg  1 mg Oral Q6H PRN Antonieta Pert, MD      . magnesium hydroxide (MILK OF MAGNESIA) suspension 30 mL  30 mL Oral Daily PRN Antonieta Pert, MD      . methocarbamol (ROBAXIN) tablet 500 mg  500 mg Oral Q8H PRN Antonieta Pert, MD      . metoCLOPramide (REGLAN) tablet 10 mg  10 mg Oral TID AC Antonieta Pert, MD   10 mg at 04/27/20 1156  . mirtazapine (REMERON) tablet 15 mg  15 mg Oral QHS Zamariah Seaborn Probert I, NP      . naproxen (NAPROSYN) tablet 500 mg  500 mg Oral BID PRN Antonieta Pert, MD      . ondansetron (ZOFRAN-ODT) disintegrating tablet 4 mg  4 mg Oral Q6H PRN Antonieta Pert, MD      . pantoprazole (PROTONIX) EC tablet 40 mg  40 mg Oral Daily Antonieta Pert, MD   40 mg at 04/27/20 0809  . traZODone (DESYREL) tablet 50 mg  50 mg Oral QHS PRN Armandina Stammer I, NP       Lab Results: No results found for this or any previous visit (from the past 48 hour(s)).  Blood Alcohol level:   Lab Results  Component Value Date   ETH <10 01/03/2020   ETH <10 11/08/2017   Metabolic Disorder Labs: Lab Results  Component Value Date   HGBA1C 5.0 09/05/2017   MPG 96.8 09/05/2017   No results found for: PROLACTIN No results found for: CHOL, TRIG, HDL, CHOLHDL, VLDL, LDLCALC  Physical Findings: AIMS:  , ,  ,  ,    CIWA:    COWS:  COWS Total Score: 1  Musculoskeletal: Strength & Muscle Tone: within normal limits Gait & Station: normal Patient leans: N/A  Psychiatric Specialty Exam: Physical Exam Vitals and nursing [facility] reviewed.  HENT:     Head: Normocephalic.     Nose: Nose normal.     Mouth/Throat:     Pharynx: Oropharynx is clear.  Eyes:     Pupils: Pupils are equal, round, and reactive to light.  Cardiovascular:     Rate and Rhythm: Normal rate.     Pulses: Normal pulses.  Pulmonary:     Effort: Pulmonary effort is normal.  Genitourinary:    Comments: Deferred Musculoskeletal:        General: Normal range of motion.     Cervical back: Normal range of motion.  Skin:    General: Skin is warm and dry.  Neurological:     Mental Status: He is alert and oriented to person, place, and time.     Review of Systems  Constitutional: Negative.   HENT: Negative.   Eyes: Negative.   Respiratory: Negative.   Cardiovascular: Negative.   Gastrointestinal: Negative.   Endocrine: Negative for cold intolerance.  Genitourinary: Negative for difficulty urinating.  Musculoskeletal: Negative.   Allergic/Immunologic: Negative for environmental allergies and food allergies.       Allergies: Latex, Adhesive tape.  Neurological: Negative.   Psychiatric/Behavioral: Positive for decreased concentration, dysphoric mood and sleep disturbance. Negative for agitation, behavioral problems, confusion, hallucinations, self-injury and suicidal ideas. The  patient is not nervous/anxious and is not hyperactive.     Blood pressure (!) 109/95, pulse 84, temperature 97.6 F (36.4 C),  temperature source Oral, resp. rate 18, height 6' (1.829 m), weight 63.5 kg, SpO2 99 %.Body mass index is 18.99 kg/m.  General Appearance:Casual  Eye Contact:Minimal  Speech:Normal Rate  Volume:Decreased  Mood:Depressed  Affect:Congruent  Thought Process:Coherent and Descriptions of Associations:Intact  Orientation:Full (Time, Place, and Person)  Thought Content:Logical  Suicidal Thoughts:Yes.with intent/plan  Homicidal Thoughts:No  Memory:Immediate;Fair Recent;Fair Remote;Fair  Judgement:Intact  Insight:Fair  Psychomotor Activity:Decreased  Concentration:Concentration:Fairand Attention Span: Fair  Recall:Fair  Fund of Knowledge:Fair  Language:Good  Akathisia:Negative  Handed:Right  AIMS (if indicated):   Assets:Desire for Improvement Resilience  ADL's:Intact  Cognition:WNL    Sleep:  Number of Hours: 6.75   Treatment Plan Summary: Daily contact with patient to assess and evaluate symptoms and progress in treatment, Medication management and Patient is a 44 year old male with a past psychiatric history significant for polysubstance dependence in remission on maintenance therapy who presented to the [facility] on 04/25/2020 with suicidal ideation.    Continue inpatient hospitalization. Will continue today 04/27/2020 plan as below except where it is noted.  Depression. Initiated Mirtazapine 15 mg po Q hs including for appetite stimulation.  Anxiety. Continue Vistaril 25 mg po qid prn. Continue Lorazepam 1 mg po Q 6 hrs prn for CIWA > 10.  Insomnia. Continue Trazodone 50 mg po prn.  Opioid use disorder. Continue Suboxone 8-2 mg per SL tid.  Other medical complaints, Continue Bentyl 20 mg po Q 6 hrs prn for abdominal spasms. Continue Imodium 2-4 mg po prn for loose stools. Continue MON 30 ml po daily prn for constipation Continue Robaxin 500 mg po Q 8 hrs prn for muscle  spasms. Continue Reglan 10 mg po tid before meals for N/V. Continue Naproxen 500 mg po bid prn for pain. Continue Zofran-ODT 4 mg po Q 6 hrs prn for N/V. Continue Protonix 40 mg po Q am for GERD. Initiated Ensure 1 can po tid for nutritional supplementation.  Encourage group participation. Discharge disposition plan in progress.  Armandina Stammer, NP, PMHNP, FNP-BC 04/27/2020, 2:18 PM

## 2020-04-27 NOTE — Progress Notes (Signed)
D: Patient's mood has improved. He states he slept "so so." Patient rates his depression, hopelessness and anxiety as an 8 today. He denies any thoughts of self harm today. Patient is able to return home upon discharge. His goal today is to "get out of here."  He describes his energy level as "low."  He denies any physical symptoms.   A: Continue to monitor medication management and MD orders.  Safety checks completed every 15 minutes per protocol.  Offer support and encouragement as needed.  R: Patient is receptive to staff; his behavior is appropriate.

## 2020-04-27 NOTE — Progress Notes (Signed)
Date:  04/27/2020 Time:  8:00 PM   Group Topic/Focus: Wrap Up group Goals Group:   The focus of this group is to reflect on daily goals established daily goals to achieve during treatment and discuss how the patient can incorporate goal setting into their daily lives to aide in recovery.   Participation Level:  Active   Participation Quality:  Appropriate   Cognitive:  Appropriate   Insight: Appropriate   Engagement in Group:  Engaged   Modes of Intervention:  Discussion, Education and Exploration   Additional Comments:  Pt rated his energy at a 10   Foxx, Charlynne Cousins 04/27/2020

## 2020-04-28 NOTE — [facility] Group Notes (Signed)
Occupational Therapy Group Note Date: 04/28/2020 Group Topic/Focus: Self-Esteem  Group Description: Group encouraged increased engagement and participation through discussion and activity focused on self-esteem. Patients explored and discussed the differences between healthy and low self-esteem and how it affects our daily lives and occupations with a focus on relationships, work, school, self-care, and personal leisure interests. Group discussion then transitioned into identifying specific strategies to boost self-esteem and engaged in a collaborative and independent activity looking at positive ways to describe oneself A-Z.  Participation Level: Active   Participation Quality: Independent   Behavior: Calm, Cooperative and Interactive   Speech/Thought Process: Focused   Affect/Mood: Euthymic   Insight: Fair   Judgement: Moderate   Individualization: Steven [facility] was active in his participation of activity and discussion and shared that he frequently has low self-esteem. 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 Pt identified positive qualities about himself "strong, wise, youthful, and helpful." Engaged appropriately for duration.   Modes of Intervention: Activity, Discussion, Education and Socialization  Patient Response to Interventions:  Attentive, Engaged, Receptive and Interested   Plan: Continue to engage patient in OT groups 2 - 3x/week.   04/28/2020  Donne Hazel, MOT, OTR/L

## 2020-04-28 NOTE — Progress Notes (Signed)
Recreation Therapy Notes  Date:  8.30.21 Time: 0930 Location: [address] Group Room  Group Topic: Stress Management  Goal Area(s) Addresses:  Patient will identify positive stress management techniques. Patient will identify benefits of using stress management post d/c.  Intervention: Stress Management  Activity:  Guided Imagery.  LRT read a script that took patients on a journey through the forest.  Patients were to listen and follow along as script was read to engage in activity.    Education:  Stress Management, Discharge Planning.   Education Outcome: Acknowledges Education  Clinical Observations/Feedback: Pt did not attend activity.    Caroll Rancher, LRT/CTRS         Caroll Rancher A 04/28/2020 11:32 AM

## 2020-04-28 NOTE — Progress Notes (Signed)
[facility] MD Progress Note  04/28/2020 12:56 PM Paulo R Ahart Jr.  MRN:  [id number]  Subjective: Carlous reports, "I'm doing a whole lot better. And this because I'm able to open up some about my problems during group sessions. I slept great last night. I have no depression or anxiety today. I think I'm ready to go home to my wife, she is there waiting for me to come home".  Objective:Patient is a 44 year old male with a past psychiatric history significant for polysubstance dependence in remission on maintenance therapy who presented to the [facility] on 04/25/2020 with suicidal ideation. The patient had been picked up by [facility] police when he was sitting on a bridge. He had a plan to jump off the bridge and kill himself. He reported that he had been clean for several years. He had been on maintenance therapy with buprenorphine and had recently completed parole and remained drug-free except for his maintenance therapy and marijuana. He stated that her niece her cousin in the home had apparently been abusing methamphetamines. Qusay is seen, chart reviewed. The chart findings discussed with the treatment. He presents alert, oriented & visible on the unit. He is attending group sessions. He says today that he is feeling great. Denies any symptoms of depression or anxiety. Says he slept well last night. He is taking & tolerating his treatment regimen. Denies any side effects.  He currently denies any SIHI, AVH, delusional thoughts or paranoia. He does not appear to be responding to any internal stimuli. Laine is stable enough for discharge tomorrow morning.  Principal Problem: Major depressive disorder, recurrent episode, severe (HCC)  Diagnosis: Principal Problem:   Major depressive disorder, recurrent episode, severe (HCC) Active Problems:   Opiate dependence (HCC)  Total Time spent with patient: 15 minutes  Past Psychiatric History: See H&P  Past Medical History:   Past Medical History:  Diagnosis Date  . ADHD   . Anxiety   . Chronic urethral stricture   . Depression   . Diverticulitis   . Frequency of urination   . Headache(784.0)   . Lower abdominal pain   . Mitral valve prolapse   . Nocturia   . PTSD (post-traumatic stress disorder)   . 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 Urgency of urination   . Urinary straining     Past Surgical History:  Procedure Laterality Date  . CARDIAC SURGERY    . COLONOSCOPY N/A 08/17/2013   Procedure: COLONOSCOPY;  Surgeon: Romie Levee, MD;  Location: [facility] ENDOSCOPY;  Service: Endoscopy;  Laterality: N/A;  . CYSTO/ RETROGRADE PYELOGRAM/ URETHRAL DILATION  11-05-2010  . MITRAL VALVE REPAIR Right 09/07/2017   Procedure: MINIMALLY INVASIVE MITRAL VALVE REPAIR (MVR) using Sorin Memo 3D Ring size 34;  Surgeon: Purcell Nails, MD;  Location: [facility] OR;  Service: Open Heart Surgery;  Laterality: Right;  . RIGHT/LEFT HEART CATH AND CORONARY ANGIOGRAPHY N/A 07/22/2017   Procedure: RIGHT/LEFT HEART CATH AND CORONARY ANGIOGRAPHY;  Surgeon: Tonny Bollman, MD;  Location: [facility] INVASIVE CV LAB;  Service: Cardiovascular;  Laterality: N/A;  . TEE WITHOUT CARDIOVERSION N/A 06/28/2017   Procedure: TRANSESOPHAGEAL ECHOCARDIOGRAM (TEE);  Surgeon: Jake Bathe, MD;  Location: [facility] ENDOSCOPY;  Service: Cardiovascular;  Laterality: N/A;  . TEE WITHOUT CARDIOVERSION N/A 09/07/2017   Procedure: TRANSESOPHAGEAL ECHOCARDIOGRAM (TEE);  Surgeon: Cornelius Moras,  Salvatore Decent, MD;  Location: [facility] OR;  Service: Open Heart Surgery;  Laterality: N/A;  . TOOTH EXTRACTION     canines   Family History:  Family History  Problem Relation Age of Onset  . Heart disease Father    Family Psychiatric  History: See H&P  Social History:  Social History    Substance and Sexual Activity  Alcohol Use Yes   Comment: Using for 25 years     Social History   Substance and Sexual Activity  Drug Use Not Currently  . Types: Marijuana, "Crack" cocaine, Methamphetamines   Comment: pt is in jail    Social History   Socioeconomic History  . Marital status: Married    Spouse name: Not on file  . Number of children: Not on file  . Years of education: Not on file  . Highest education level: Not on file  Occupational History  . Not on file  Tobacco Use  . Smoking status: Current Some Day Smoker  . Smokeless tobacco: Never Used  Substance and Sexual Activity  . Alcohol use: Yes    Comment: Using for 25 years  . Drug use: Not Currently    Types: Marijuana, "Crack" cocaine, Methamphetamines    Comment: pt is in jail  . Sexual activity: Not on file  Other Topics Concern  . Not on file  Social History Narrative   ** Merged History Encounter **       Social Determinants of Health   Financial Resource Strain:   . Difficulty of Paying Living Expenses: Not on file  Food Insecurity:   . Worried About Programme researcher, broadcasting/film/video in the Last Year: Not on file  . Ran Out of Food in the Last Year: Not on file  Transportation Needs:   . Lack of Transportation (Medical): Not on file  . Lack of Transportation (Non-Medical): Not on file  Physical Activity:   . Days of Exercise per Week: Not on file  . Minutes of Exercise per Session: Not on file  Stress:   . Feeling of Stress : Not on file  Social Connections:   . Frequency of Communication with Friends and Family: Not on file  . Frequency of Social Gatherings with Friends and Family: Not on file  . Attends Religious Services: Not on file  . Active Member of Clubs or Organizations: Not on file  . Attends Banker Meetings: Not on file  . Marital Status: Not on file   Additional Social History:   Sleep: Good  Appetite:  Good  Current Medications: Current Facility-Administered  Medications  Medication Dose Route Frequency Provider Last Rate Last Admin  . acetaminophen (TYLENOL) tablet 650 mg  650 mg Oral Q6H PRN Antonieta Pert, MD      . alum & mag hydroxide-simeth (MAALOX/MYLANTA) 200-200-20 MG/5ML suspension 30 mL  30 mL Oral Q4H PRN Antonieta Pert, MD      . buprenorphine-naloxone (SUBOXONE) 8-2 mg per SL tablet 1 tablet  1 tablet Sublingual TID Antonieta Pert, MD   1 tablet at 04/28/20 1213  . dicyclomine (BENTYL) tablet 20 mg  20 mg Oral Q6H PRN Antonieta Pert, MD      . feeding supplement (ENSURE ENLIVE) (ENSURE ENLIVE) liquid 237 mL  237 mL Oral TID BM Shagun Wordell I, NP   237 mL at 04/28/20 1043  . hydrOXYzine (ATARAX/VISTARIL) tablet 25 mg  25 mg Oral Q6H PRN Antonieta Pert, MD   25 mg at  04/26/20 2121  . loperamide (IMODIUM) capsule 2-4 mg  2-4 mg Oral PRN Antonieta Pert, MD      . LORazepam (ATIVAN) tablet 1 mg  1 mg Oral Q6H PRN Antonieta Pert, MD      . magnesium hydroxide (MILK OF MAGNESIA) suspension 30 mL  30 mL Oral Daily PRN Antonieta Pert, MD      . methocarbamol (ROBAXIN) tablet 500 mg  500 mg Oral Q8H PRN Antonieta Pert, MD      . metoCLOPramide (REGLAN) tablet 10 mg  10 mg Oral TID AC Antonieta Pert, MD   10 mg at 04/28/20 1213  . mirtazapine (REMERON) tablet 15 mg  15 mg Oral QHS Meilech Virts I, NP   15 mg at 04/27/20 2058  . naproxen (NAPROSYN) tablet 500 mg  500 mg Oral BID PRN Antonieta Pert, MD      . ondansetron (ZOFRAN-ODT) disintegrating tablet 4 mg  4 mg Oral Q6H PRN Antonieta Pert, MD      . pantoprazole (PROTONIX) EC tablet 40 mg  40 mg Oral Daily Antonieta Pert, MD   40 mg at 04/28/20 0755  . traZODone (DESYREL) tablet 50 mg  50 mg Oral QHS PRN Armandina Stammer I, NP       Lab Results: No results found for this or any previous visit (from the past 48 hour(s)).  Blood Alcohol level:  Lab Results  Component Value Date   ETH <10 01/03/2020   ETH <10 11/08/2017   Metabolic Disorder  Labs: Lab Results  Component Value Date   HGBA1C 5.0 09/05/2017   MPG 96.8 09/05/2017   No results found for: PROLACTIN No results found for: CHOL, TRIG, HDL, CHOLHDL, VLDL, LDLCALC  Physical Findings: AIMS:  , ,  ,  ,    CIWA:    COWS:  COWS Total Score: 1  Musculoskeletal: Strength & Muscle Tone: within normal limits Gait & Station: normal Patient leans: N/A  Psychiatric Specialty Exam: Physical Exam Vitals and nursing note reviewed.  HENT:     Head: Normocephalic.     Nose: Nose normal.     Mouth/Throat:     Pharynx: Oropharynx is clear.  Eyes:     Pupils: Pupils are equal, round, and reactive to light.  Cardiovascular:     Rate and Rhythm: Normal rate.     Pulses: Normal pulses.  Pulmonary:     Effort: Pulmonary effort is normal.  Genitourinary:    Comments: Deferred Musculoskeletal:        General: Normal range of motion.     Cervical back: Normal range of motion.  Skin:    General: Skin is warm and dry.  Neurological:     Mental Status: He is alert and oriented to person, place, and time.     Review of Systems  Constitutional: Negative.   HENT: Negative.   Eyes: Negative.   Respiratory: Negative.   Cardiovascular: Negative.   Gastrointestinal: Negative.   Endocrine: Negative for cold intolerance.  Genitourinary: Negative for difficulty urinating.  Musculoskeletal: Negative.   Allergic/Immunologic: Negative for environmental allergies and food allergies.       Allergies: Latex, Adhesive tape.  Neurological: Negative.   Psychiatric/Behavioral: Positive for decreased concentration, dysphoric mood and sleep disturbance. Negative for agitation, behavioral problems, confusion, hallucinations, self-injury and suicidal ideas. The patient is not nervous/anxious and is not hyperactive.     Blood pressure (!) 115/96, pulse 79, temperature 97.9 F (36.6 C), temperature  source Oral, resp. rate 18, height 6' (1.829 m), weight 63.5 kg, SpO2 99 %.Body mass index is  18.99 kg/m.  General Appearance:Casual  Eye Contact:Minimal  Speech:Normal Rate  Volume:Decreased  Mood:Depressed  Affect:Congruent  Thought Process:Coherent and Descriptions of Associations:Intact  Orientation:Full (Time, Place, and Person)  Thought Content:Logical  Suicidal Thoughts:Yes.with intent/plan  Homicidal Thoughts:No  Memory:Immediate;Fair Recent;Fair Remote;Fair  Judgement:Intact  Insight:Fair  Psychomotor Activity:Decreased  Concentration:Concentration:Fairand Attention Span: Fair  Recall:Fair  Fund of Knowledge:Fair  Language:Good  Akathisia:Negative  Handed:Right  AIMS (if indicated):   Assets:Desire for Improvement Resilience  ADL's:Intact  Cognition:WNL    Sleep:  Number of Hours: 5.5   Treatment Plan Summary: Daily contact with patient to assess and evaluate symptoms and progress in treatment, Medication management and Patient is a 44 year old male with a past psychiatric history significant for polysubstance dependence in remission on maintenance therapy [facility] with suicidal ideation.    Continue inpatient hospitalization. Will continue today 04/28/2020 plan as below except where it is noted.  Depression. Continue Mirtazapine 15 mg po Q hs including for appetite stimulation.  Anxiety. Continue Vistaril 25 mg po qid prn. Continue Lorazepam 1 mg po Q 6 hrs prn for CIWA > 10.  Insomnia. Continue Trazodone 50 mg po prn.  Opioid use disorder. Continue Suboxone 8-2 mg per SL tid.  Other medical complaints, Continue Bentyl 20 mg po Q 6 hrs prn for abdominal spasms. Continue Imodium 2-4 mg po prn for loose stools. Continue MON 30 ml po daily prn for constipation Continue Robaxin 500 mg po Q 8 hrs prn for muscle spasms. Continue Reglan 10 mg po tid before meals for N/V. Continue Naproxen 500 mg po bid prn for  pain. Continue Zofran-ODT 4 mg po Q 6 hrs prn for N/V. Continue Protonix 40 mg po Q am for GERD. Continue Ensure 1 can po tid for nutritional supplementation.  Encourage group participation. Discharge disposition plan in progress.  Armandina StammerAgnes Lewanna Petrak, NP, PMHNP, FNP-BC 04/28/2020, 12:56 PMPatient ID: Aleatha Boreronnie R Thackeray Jr., male   DOB: 1976/01/17, 44 y.o.   MRN: [id number]

## 2020-04-28 NOTE — Progress Notes (Signed)
   04/28/20 0624  Vital Signs  Temp 97.9 F (36.6 C)  Temp Source Oral  Pulse Rate 71  BP 122/87  BP Location Right Arm  BP Method Automatic  Patient Position (if appropriate) Sitting   D: Patient denies SI/HI/AVH. Patient rated anxiety 2/10. Patient out in open areas. A:  Patient took scheduled medicine.  Support and encouragement provided Routine safety checks conducted every 15 minutes. Patient  Informed to notify staff with any concerns.   R: Safety maintained.

## 2020-04-28 NOTE — Tx Team (Signed)
Interdisciplinary Treatment and Diagnostic Plan Update  04/28/2020 Time of Session: 0910 Steven R Beswick Jr. MRN: [id number]  Principal Diagnosis: Major depressive disorder, recurrent episode, severe (HCC)  Secondary Diagnoses: Principal Problem:   Major depressive disorder, recurrent episode, severe (HCC) Active Problems:   Opiate dependence (HCC)   Current Medications:  Current Facility-Administered Medications  Medication Dose Route Frequency Provider Last Rate Last Admin  . acetaminophen (TYLENOL) tablet 650 mg  650 mg Oral Q6H PRN Antonieta Pert, MD      . alum & mag hydroxide-simeth (MAALOX/MYLANTA) 200-200-20 MG/5ML suspension 30 mL  30 mL Oral Q4H PRN Antonieta Pert, MD      . buprenorphine-naloxone (SUBOXONE) 8-2 mg per SL tablet 1 tablet  1 tablet Sublingual TID Antonieta Pert, MD   1 tablet at 04/28/20 0757  . dicyclomine (BENTYL) tablet 20 mg  20 mg Oral Q6H PRN Antonieta Pert, MD      . feeding supplement (ENSURE ENLIVE) (ENSURE ENLIVE) liquid 237 mL  237 mL Oral TID BM Montoya, Steven I, NP   237 mL at 04/28/20 1043  . hydrOXYzine (ATARAX/VISTARIL) tablet 25 mg  25 mg Oral Q6H PRN Antonieta Pert, MD   25 mg at 04/26/20 2121  . loperamide (IMODIUM) capsule 2-4 mg  2-4 mg Oral PRN Antonieta Pert, MD      . LORazepam (ATIVAN) tablet 1 mg  1 mg Oral Q6H PRN Antonieta Pert, MD      . magnesium hydroxide (MILK OF MAGNESIA) suspension 30 mL  30 mL Oral Daily PRN Antonieta Pert, MD      . methocarbamol (ROBAXIN) tablet 500 mg  500 mg Oral Q8H PRN Antonieta Pert, MD      . metoCLOPramide (REGLAN) tablet 10 mg  10 mg Oral TID AC Antonieta Pert, MD   10 mg at 04/28/20 0755  . mirtazapine (REMERON) tablet 15 mg  15 mg Oral QHS Montoya, Steven I, NP   15 mg at 04/27/20 2058  . naproxen (NAPROSYN) tablet 500 mg  500 mg Oral BID PRN Antonieta Pert, MD      . ondansetron (ZOFRAN-ODT) disintegrating tablet 4 mg  4 mg Oral Q6H PRN Antonieta Pert, MD       . pantoprazole (PROTONIX) EC tablet 40 mg  40 mg Oral Daily Antonieta Pert, MD   40 mg at 04/28/20 0755  . traZODone (DESYREL) tablet 50 mg  50 mg Oral QHS PRN Armandina Stammer I, NP       PTA Medications: Medications Prior to Admission  Medication Sig Dispense Refill Last Dose  . acetaminophen (TYLENOL) 500 MG tablet Take 1,000 mg by mouth every 6 (six) hours as needed for headache (pain).     Marland Kitchen ALPRAZolam (XANAX) 0.5 MG tablet Take 0.5 mg by mouth 3 (three) times daily as needed for anxiety.     Marland Kitchen aspirin EC 81 MG EC tablet Take 1 tablet (81 mg total) by mouth daily. (Patient not taking: Reported on 04/25/2020)     . gabapentin (NEURONTIN) 300 MG capsule Take 300 mg by mouth 3 (three) times daily.     . metoCLOPramide (REGLAN) 10 MG tablet Take 1 tablet (10 mg total) by mouth every 8 (eight) hours as needed for nausea. (Patient not taking: Reported on 12/14/2018) 8 tablet 0   . omeprazole (PRILOSEC) 20 MG capsule Take 1 capsule (20 mg total) by mouth daily.     . QUEtiapine (SEROQUEL) 400  MG tablet Take 400 mg by mouth at bedtime.     . SUBOXONE 8-2 MG FILM Place 1 strip under the tongue 3 (three) times daily.       Patient Stressors:    Patient Strengths:    Treatment Modalities: Medication Management, Group therapy, Case management,  1 to 1 session with clinician, Psychoeducation, Recreational therapy.   Physician Treatment Plan for Primary Diagnosis: Major depressive disorder, recurrent episode, severe (HCC) [place] Term Goal(s): Improvement in symptoms so as ready for discharge Improvement in symptoms so as ready for discharge   Short Term Goals: Ability to identify changes in lifestyle to reduce recurrence of condition will improve Ability to verbalize feelings will improve Ability to disclose and discuss suicidal ideas Ability to demonstrate self-control will improve Ability to identify and develop effective coping behaviors will improve Compliance with prescribed medications  will improve Ability to identify triggers associated with substance abuse/mental health issues will improve  Medication Management: Evaluate patient's response, side effects, and tolerance of medication regimen.  Therapeutic Interventions: 1 to 1 sessions, Unit Group sessions and Medication administration.  Evaluation of Outcomes: Progressing  Physician Treatment Plan for Secondary Diagnosis: Principal Problem:   Major depressive disorder, recurrent episode, severe (HCC) Active Problems:   Opiate dependence (HCC)  [place] Term Goal(s): Improvement in symptoms so as ready for discharge Improvement in symptoms so as ready for discharge   Short Term Goals: Ability to identify changes in lifestyle to reduce recurrence of condition will improve Ability to verbalize feelings will improve Ability to disclose and discuss suicidal ideas Ability to demonstrate self-control will improve Ability to identify and develop effective coping behaviors will improve Compliance with prescribed medications will improve Ability to identify triggers associated with substance abuse/mental health issues will improve     Medication Management: Evaluate patient's response, side effects, and tolerance of medication regimen.  Therapeutic Interventions: 1 to 1 sessions, Unit Group sessions and Medication administration.  Evaluation of Outcomes: Progressing   RN Treatment Plan for Primary Diagnosis: Major depressive disorder, recurrent episode, severe (HCC) [place] Term Goal(s): Knowledge of disease and therapeutic regimen to maintain health will improve  Short Term Goals: Ability to remain free from injury will improve, Ability to verbalize feelings will improve, Ability to disclose and discuss suicidal ideas and Compliance with prescribed medications will improve  Medication Management: RN will administer medications as ordered by provider, will assess and evaluate patient's response and provide education to  patient for prescribed medication. RN will report any adverse and/or side effects to prescribing provider.  Therapeutic Interventions: 1 on 1 counseling sessions, Psychoeducation, Medication administration, Evaluate responses to treatment, Monitor vital signs and CBGs as ordered, Perform/monitor CIWA, COWS, AIMS and Fall Risk screenings as ordered, Perform wound care treatments as ordered.  Evaluation of Outcomes: Progressing   LCSW Treatment Plan for Primary Diagnosis: Major depressive disorder, recurrent episode, severe (HCC) [place] Term Goal(s): Safe transition to appropriate next level of care at discharge, Engage patient in therapeutic group addressing interpersonal concerns.  Short Term Goals: Engage patient in aftercare planning with referrals and resources, Increase ability to appropriately verbalize feelings, Increase emotional regulation and Increase skills for wellness and recovery  Therapeutic Interventions: Assess for all discharge needs, 1 to 1 time with Social worker, Explore available resources and support systems, Assess for adequacy in community support network, Educate family and significant other(s) on suicide prevention, Complete Psychosocial Assessment, Interpersonal group therapy.  Evaluation of Outcomes: Progressing   Progress in Treatment: Attending groups: Yes. Participating in  groups: Yes. Taking medication as prescribed: Yes. Toleration medication: Yes. Family/Significant other contact made: Yes, individual(s) contacted:  wife. Patient understands diagnosis: Yes. Discussing patient identified problems/goals with staff: Yes. Medical problems stabilized or resolved: Yes. Denies suicidal/homicidal ideation: Yes. Issues/concerns per patient self-inventory: No. Other: N/A  New problem(s) identified: No, Describe:  None noted.  New Short Term/[place] Term Goal(s): Safe transition to appropriate next level of care at discharge, Engage patient in therapeutic group  addressing interpersonal concerns.  Patient Goals:  "Figure out ways to cope with life itself"  Discharge Plan or Barriers:  Pt to return to home. Pt to follow up with outpatient therapy and medication management services.  Reason for Continuation of Hospitalization: Anxiety Depression Medication stabilization Suicidal ideation  Estimated Length of Stay: 1-3 days  Attendees: Patient: Steven Montoya 04/28/2020 11:07 AM  Physician: Dr. Jola Babinski, MD 04/28/2020 11:07 AM  Nursing:  04/28/2020 11:07 AM  RN Care Manager: 04/28/2020 11:07 AM  Social Worker: Cyril Loosen, LCSW 04/28/2020 11:07 AM  Recreational Therapist:  04/28/2020 11:07 AM  Other:  04/28/2020 11:07 AM  Other:  04/28/2020 11:07 AM  Other: 04/28/2020 11:07 AM    Scribe for Treatment Team: Leisa Lenz, LCSW 04/28/2020 11:07 AM

## 2020-04-28 NOTE — Progress Notes (Signed)
Adult Psychoeducational Group Note  Date:  04/28/2020 Time:  12:12 PM  Group Topic/Focus:  Goals Group:   The focus of this group is to help patients establish daily goals to achieve during treatment and discuss how the patient can incorporate goal setting into their daily lives to aide in recovery.  Participation Level:  Active  Participation Quality:  Appropriate  Affect:  Appropriate  Cognitive:  Appropriate  Insight: Appropriate  Engagement in Group:  Engaged  Modes of Intervention:  Discussion  Additional Comments:  Patient attended Goal Setting and Stress Management group and participated.    Kailyn Dubie W Chrsitopher Wik 04/28/2020, 12:12 PM

## 2020-04-28 NOTE — Progress Notes (Signed)
DAR NOTE: Patient presents with bright affect and pleasant mood.  Denies pain, auditory and visual hallucinations.  Observed in the dayroom with peers playing cards.  Maintained on routine safety checks.  Medications given as prescribed.  Support and encouragement offered as needed. Will continue to monitor.

## 2020-04-29 MED ORDER — MIRTAZAPINE 15 MG PO TABS
15.0000 mg | ORAL_TABLET | Freq: Every day | ORAL | 0 refills | Status: DC
Start: 2020-04-29 — End: 2020-05-01

## 2020-04-29 MED ORDER — PANTOPRAZOLE SODIUM 40 MG PO TBEC: 40.0000 mg | DELAYED_RELEASE_TABLET | Freq: Every day | ORAL | 0 refills | Status: DC

## 2020-04-29 MED ORDER — METOCLOPRAMIDE HCL 10 MG PO TABS
10.0000 mg | ORAL_TABLET | Freq: Three times a day (TID) | ORAL | 0 refills | Status: DC
Start: 2020-04-29 — End: 2020-05-01

## 2020-04-29 MED ORDER — HYDROXYZINE HCL 25 MG PO TABS
25.0000 mg | ORAL_TABLET | Freq: Four times a day (QID) | ORAL | 0 refills | Status: DC | PRN
Start: 2020-04-29 — End: 2020-05-01

## 2020-04-29 NOTE — Progress Notes (Signed)
  [facility] Adult Case Management Discharge Plan :  Will you be returning to the same living situation after discharge:  Yes,  to home. At discharge, do you have transportation home?: No. Will need Safe Transport arranged.  Do you have the ability to pay for your medications: Yes,  has insurance.  Release of information consent forms completed and in the chart;  Patient's signature needed at discharge.  Patient to Follow up at:  Follow-up Information    [facility] Follow up on 05/01/2020.   Why: You have an appointment on 05/01/20 at 9:00 am, for therapy and medication management.  This appointment will be held in person.  Please call the provider beforehand so they can arrange to pick you up to bring you to your appointment.  Contact information: [address] [place] [address] [phone]               Next level of care provider has access to [facility] Link:no  Safety Planning and Suicide Prevention discussed: Yes,  with wife     Has patient been referred to the Quitline?: Patient refused referral  Patient has been referred for addiction treatment: Yes  Otelia Santee, LCSW 04/29/2020, 9:27 AM

## 2020-04-29 NOTE — Discharge Summary (Signed)
Physician Discharge Summary Note  Patient:  Steven BorerDonnie R Carline Jr. is an 44 y.o., male MRN:  [id number] DOB:  08/10/1976 Patient phone:  [phone] (home)  Patient address:   [address] [place] [id number],  Total Time spent with patient: 15 minutes  Date of Admission:  04/25/2020 Date of Discharge: 04/29/20  Reason for Admission:  suicidal ideation  Principal Problem: Major depressive disorder, recurrent episode, severe (HCC) Discharge Diagnoses: Principal Problem:   Major depressive disorder, recurrent episode, severe (HCC) Active Problems:   Opiate dependence (HCC)   Past Psychiatric History: Polysubstance use disorder, Major depressive disorder.  Past Medical History:  Past Medical History:  Diagnosis Date  . ADHD   . Anxiety   . Chronic urethral stricture   . Depression   . Diverticulitis   . Frequency of urination   . Headache(784.0)   . Lower abdominal pain   . Mitral valve prolapse   . Nocturia   . PTSD (post-traumatic stress disorder)   . 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 Urgency of urination   . Urinary straining     Past Surgical History:  Procedure Laterality Date  . CARDIAC SURGERY    . COLONOSCOPY N/A 08/17/2013   Procedure: COLONOSCOPY;  Surgeon: Romie LeveeAlicia Thomas, MD;  Location: [facility];  Service: Endoscopy;  Laterality: N/A;  . CYSTO/ RETROGRADE PYELOGRAM/ URETHRAL DILATION  11-05-2010  . MITRAL VALVE REPAIR Right 09/07/2017   Procedure: MINIMALLY INVASIVE MITRAL VALVE REPAIR (MVR) using Sorin Memo 3D Ring size 34;  Surgeon: Purcell Nailswen, Clarence H, MD;  Location: [facility] OR;  Service: Open Heart Surgery;  Laterality: Right;  . RIGHT/LEFT HEART CATH AND CORONARY ANGIOGRAPHY N/A 07/22/2017   Procedure: RIGHT/LEFT HEART CATH AND CORONARY ANGIOGRAPHY;   Surgeon: Tonny Bollmanooper, Michael, MD;  [facility];  Service: Cardiovascular;  Laterality: N/A;  . TEE WITHOUT CARDIOVERSION N/A 06/28/2017   Procedure: TRANSESOPHAGEAL ECHOCARDIOGRAM (TEE);  Surgeon: Jake BatheSkains, Mark C, MD;  [facility];  Service: Cardiovascular;  Laterality: N/A;  . TEE WITHOUT CARDIOVERSION N/A 09/07/2017   Procedure: TRANSESOPHAGEAL ECHOCARDIOGRAM (TEE);  Surgeon: Purcell Nailswen, Clarence H, MD;  [facility];  Service: Open Heart Surgery;  Laterality: N/A;  . TOOTH EXTRACTION     canines   Family History:  Family History  Problem Relation Age of Onset  . Heart disease Father    Family Psychiatric  History: Denies any hx of mental illness in his family. Denies any familial hx of drug addiction. Social History:  Social History   Substance and Sexual Activity  Alcohol Use Yes   Comment: Using for 25 years     Social History   Substance and Sexual Activity  Drug Use Not Currently  . Types: Marijuana, "Crack" cocaine, Methamphetamines   Comment: pt is in jail    Social History   Socioeconomic History  . Marital status: Married    Spouse name: Not on file  . Number of children: Not on file  . Years of education: Not on file  . Highest education level: Not on file  Occupational History  . Not on file  Tobacco Use  . Smoking status: Current Some Day Smoker  . Smokeless tobacco: Never Used  Substance and Sexual Activity  . Alcohol use: Yes    Comment: Using for 25 years  . Drug use:  Not Currently    Types: Marijuana, "Crack" cocaine, Methamphetamines    Comment: pt is in jail  . Sexual activity: Not on file  Other Topics Concern  . Not on file  Social History Narrative   ** Merged History Encounter **       Social Determinants of Health   Financial Resource Strain:   . Difficulty of Paying Living Expenses: Not on file  Food Insecurity:   . Worried About Programme researcher, broadcasting/film/video in the Last Year: Not on file  . Ran Out of Food in the Last  Year: Not on file  Transportation Needs:   . Lack of Transportation (Medical): Not on file  . Lack of Transportation (Non-Medical): Not on file  Physical Activity:   . Days of Exercise per Week: Not on file  . Minutes of Exercise per Session: Not on file  Stress:   . Feeling of Stress : Not on file  Social Connections:   . Frequency of Communication with Friends and Family: Not on file  . Frequency of Social Gatherings with Friends and Family: Not on file  . Attends Religious Services: Not on file  . Active Member of Clubs or Organizations: Not on file  . Attends Banker Meetings: Not on file  . Marital Status: Not on file    Hospital Course:  From admission H&P: Patient is a 44 year old male with a past psychiatric history significant for polysubstance dependence in remission on maintenance therapy who presented to the [facility] on 04/25/2020 with suicidal ideation.  The patient [place] [facility] [place] when he was sitting on a bridge.  He had a plan to jump off the bridge and kill himself.  He reported that he had been clean for several years.  He had been on maintenance therapy with buprenorphine and had recently completed parole and remained drug-free except for his maintenance therapy and marijuana.  He stated that her niece her cousin in the home had apparently been abusing methamphetamines.  He lives with his family given medical issues as well as his limitations and working.  Per his report they blamed him for her drug use and asked him to leave.  He is currently homeless.  He denied any drug use, and admitted that he had been treated with antidepressants in the past.  He stated his last psychiatric hospitalization was at our facility in 2017.  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  There was an unspecified depressive disorder.  He was discharged on hydroxyzine.  He stated that he  has been on several antidepressants in the past, and "they made things worse".  He feels helpless, hopeless and worthless as well as powerless over the situation.  His drug screen on admission was positive for marijuana and buprenorphine but no other substances.  He was admitted to the hospital for evaluation and stabilization.  Steven Montoya was admitted for depression with suicidal ideation. Outpatient Suboxone dose was continued. Remeron and Vistaril were started. He remained on the [facility] unit for four days. He participated in group therapy on the unit. He responded well to treatment with no adverse effects reported. He has shown improved mood, affect, sleep, and interaction. He denies any SI/HI/AVH and contracts for safety. He is discharging on the medications listed below. He agrees to follow up at [facility] (see below). Patient is provided with prescriptions for medications upon discharge. He  is discharging home via [employer].  Physical Findings: AIMS:  , ,  ,  ,    CIWA:    COWS:  COWS Total Score: 0  Musculoskeletal: Strength & Muscle Tone: within normal limits Gait & Station: normal Patient leans: N/A  Psychiatric Specialty Exam: Physical Exam Vitals and nursing note reviewed.  Constitutional:      Appearance: He is well-developed.  Cardiovascular:     Rate and Rhythm: Normal rate.  Pulmonary:     Effort: Pulmonary effort is normal.  Neurological:     Mental Status: He is alert and oriented to person, place, and time.     Review of Systems  Constitutional: Negative.   Respiratory: Negative for cough and shortness of breath.   Psychiatric/Behavioral: Negative for agitation, behavioral problems, decreased concentration, dysphoric mood, hallucinations, self-injury, sleep disturbance and suicidal ideas. The patient is not nervous/anxious and is not hyperactive.     Blood pressure (!) 105/91, pulse 72, temperature 98.1 F (36.7 C), temperature source Oral, resp. rate 18,  height 6' (1.829 m), weight 63.5 kg, SpO2 99 %.Body mass index is 18.99 kg/m.  See MD's discharge SRA      Has this patient used any form of tobacco in the last 30 days? (Cigarettes, Smokeless Tobacco, Cigars, and/or Pipes)  No  Blood Alcohol level:  Lab Results  Component Value Date   ETH <10 01/03/2020   ETH <10 11/08/2017    Metabolic Disorder Labs:  Lab Results  Component Value Date   HGBA1C 5.0 09/05/2017   MPG 96.8 09/05/2017   No results found for: PROLACTIN No results found for: CHOL, TRIG, HDL, CHOLHDL, VLDL, LDLCALC  See Psychiatric Specialty Exam and Suicide Risk Assessment completed by Attending Physician prior to discharge.  Discharge destination:  Home  Is patient on multiple antipsychotic therapies at discharge:  No   Has Patient had three or more failed trials of antipsychotic monotherapy by history:  No  Recommended Plan for Multiple Antipsychotic Therapies: NA  Discharge Instructions    Discharge instructions   Complete by: As directed    Patient is instructed to take all prescribed medications as recommended. Report any side effects or adverse reactions to your outpatient psychiatrist. Patient is instructed to abstain from alcohol and illegal drugs while on prescription medications. In the event of worsening symptoms, patient is instructed to call the crisis hotline, 911, or go to the nearest emergency department for evaluation and treatment.     Allergies as of 04/29/2020      Reactions   Latex Other (See Comments)   HX SUPERFICIAL THROMBOPHLEBITIS LEFT FOREARM AFTER IV IN APR 2011   Adhesive [tape] Rash      Medication List    STOP taking these medications   acetaminophen 500 MG tablet Commonly known as: TYLENOL   ALPRAZolam 0.5 MG tablet Commonly known as: XANAX   aspirin 81 MG EC tablet   gabapentin 300 MG capsule Commonly known as: NEURONTIN   omeprazole 20 MG capsule Commonly known as: PRILOSEC   QUEtiapine 400 MG  tablet Commonly known as: SEROQUEL     TAKE these medications     Indication  hydrOXYzine 25 MG tablet Commonly known as: ATARAX/VISTARIL Take 1 tablet (25 mg total) by mouth every 6 (six) hours as needed for anxiety.  Indication: Feeling Anxious   metoCLOPramide 10 MG tablet Commonly known as: REGLAN Take 1 tablet (10 mg total) by mouth 3 (three) times daily before meals. What changed:   when to  take this  reasons to take this  Indication: Nausea and Vomiting   mirtazapine 15 MG tablet Commonly known as: REMERON Take 1 tablet (15 mg total) by mouth at bedtime.  Indication: Major Depressive Disorder   pantoprazole 40 MG tablet Commonly known as: PROTONIX Take 1 tablet (40 mg total) by mouth daily. Start taking on: April 30, 2020  Indication: Gastroesophageal Reflux Disease   Suboxone 8-2 MG Film Generic drug: Buprenorphine HCl-Naloxone HCl Place 1 strip under the tongue 3 (three) times daily.  Indication: Opioid Dependence       Follow-up Information    [facility] Follow up on 05/01/2020.   Why: You have an appointment on 05/01/20 at 9:00 am, for therapy and medication management.  This appointment will be held in person.  Please call the provider beforehand so they can arrange to pick you up to bring you to your appointment.  Contact information: [address] [place] [address] [phone]               Follow-up recommendations: Activity as tolerated. Diet as recommended by primary care physician. Keep all scheduled follow-up appointments as recommended.  Comments:   Patient is instructed to take all prescribed medications as recommended. Report any side effects or adverse reactions to your outpatient psychiatrist. Patient is instructed to abstain from alcohol and illegal drugs while on prescription medications. In the event of worsening symptoms, patient is instructed to call the crisis hotline, 911, or go to the nearest  emergency department for evaluation and treatment.  Signed: Aldean Baker, NP 04/29/2020, 9:04 AM

## 2020-04-29 NOTE — BHH Suicide Risk Assessment (Signed)
[facility] Discharge Suicide Risk Assessment   Principal Problem: Major depressive disorder, recurrent episode, severe (HCC) Discharge Diagnoses: Principal Problem:   Major depressive disorder, recurrent episode, severe (HCC) Active Problems:   Opiate dependence (HCC)   Total Time spent with patient: 15 minutes  Musculoskeletal: Strength & Muscle Tone: within normal limits Gait & Station: normal Patient leans: N/A  Psychiatric Specialty Exam: Review of Systems  All other systems reviewed and are negative.   Blood pressure (!) 105/91, pulse 72, temperature 98.1 F (36.7 C), temperature source Oral, resp. rate 18, height 6' (1.829 m), weight 63.5 kg, SpO2 99 %.Body mass index is 18.99 kg/m.  General Appearance: Casual  Eye Contact::  Fair  Speech:  Normal Rate409  Volume:  Decreased  Mood:  Euthymic  Affect:  Congruent  Thought Process:  Coherent and Descriptions of Associations: Intact  Orientation:  Full (Time, Place, and Person)  Thought Content:  Logical  Suicidal Thoughts:  No  Homicidal Thoughts:  No  Memory:  Immediate;   Fair Recent;   Fair Remote;   Fair  Judgement:  Intact  Insight:  Fair  Psychomotor Activity:  Normal  Concentration:  Fair  Recall:  [employer] of Knowledge:Fair  Language: Good  Akathisia:  Negative  Handed:  Right  AIMS (if indicated):     Assets:  Desire for Improvement Housing Resilience Social Support  Sleep:  Number of Hours: 7  Cognition: WNL  ADL's:  Intact   Mental Status Per Nursing Assessment::   On Admission:  Self-harm thoughts  Demographic Factors:  Male, Caucasian, Low socioeconomic status and Unemployed  Loss Factors: Loss of significant relationship  Historical Factors: Impulsivity  Risk Reduction Factors:   Sense of responsibility to family, Living with another person, especially a relative and Positive social support  Continued Clinical Symptoms:  Depression:   Impulsivity Alcohol/Substance  Abuse/Dependencies  Cognitive Features That Contribute To Risk:  None    Suicide Risk:  Minimal: No identifiable suicidal ideation.  Patients presenting with no risk factors but with morbid ruminations; may be classified as minimal risk based on the severity of the depressive symptoms   Follow-up Information    [facility] Follow up on 05/01/2020.   Why: You have an appointment on 05/01/20 at 9:00 am, for therapy and medication management.  This appointment will be held in person.  Please call the provider beforehand so they can arrange to pick you up to bring you to your appointment.  Contact information: [address] [place] [address] [phone]               Plan Of Care/Follow-up recommendations:  Activity:  ad lib  Antonieta Pert, MD 04/29/2020, 8:14 AM

## 2020-04-29 NOTE — Progress Notes (Signed)
Cooperative with treatment, he denies any SI, AV/VH or any homicidal ideation. He does still report some anxiety on shift.  He was visible in the milieu. No behavioral issues to report on shift at this time.He is currently in bed resting quietly at this time.

## 2020-04-29 NOTE — Progress Notes (Signed)
Discharge Note:  Patient denies SI/HI AVH at this time. Discharge instructions, AVS, prescriptions and transition record gone over with patient. Patient agrees to comply with medication management, follow-up visit, and outpatient therapy. Patient belongings returned to patient. Patient questions and concerns addressed and answered.  Patient ambulatory off unit.  Patient discharged to home.   

## 2020-05-01 ENCOUNTER — Encounter (HOSPITAL_COMMUNITY): Payer: Self-pay

## 2020-05-01 ENCOUNTER — Other Ambulatory Visit: Payer: Self-pay

## 2020-05-01 ENCOUNTER — Ambulatory Visit (HOSPITAL_COMMUNITY)
Admission: EM | Admit: 2020-05-01 | Discharge: 2020-05-02 | Disposition: A | Discharge status: Home / Self Care | Payer: Medicaid Other | Attending: Nurse Practitioner | Admitting: Nurse Practitioner

## 2020-05-01 DIAGNOSIS — Z20822 Contact with and (suspected) exposure to covid-19: Secondary | ICD-10-CM | POA: Diagnosis not present

## 2020-05-01 DIAGNOSIS — F332 Major depressive disorder, recurrent severe without psychotic features: Secondary | ICD-10-CM | POA: Diagnosis not present

## 2020-05-01 DIAGNOSIS — R450 Nervousness: Secondary | ICD-10-CM | POA: Insufficient documentation

## 2020-05-01 DIAGNOSIS — R45851 Suicidal ideations: Secondary | ICD-10-CM | POA: Insufficient documentation

## 2020-05-01 DIAGNOSIS — Z915 Personal history of self-harm: Secondary | ICD-10-CM | POA: Insufficient documentation

## 2020-05-01 DIAGNOSIS — F17210 Nicotine dependence, cigarettes, uncomplicated: Secondary | ICD-10-CM | POA: Insufficient documentation

## 2020-05-01 DIAGNOSIS — Z7289 Other problems related to lifestyle: Secondary | ICD-10-CM | POA: Insufficient documentation

## 2020-05-01 DIAGNOSIS — F419 Anxiety disorder, unspecified: Secondary | ICD-10-CM | POA: Insufficient documentation

## 2020-05-01 DIAGNOSIS — F4310 Post-traumatic stress disorder, unspecified: Secondary | ICD-10-CM | POA: Insufficient documentation

## 2020-05-01 DIAGNOSIS — G4700 Insomnia, unspecified: Secondary | ICD-10-CM | POA: Insufficient documentation

## 2020-05-01 DIAGNOSIS — F1910 Other psychoactive substance abuse, uncomplicated: Secondary | ICD-10-CM | POA: Insufficient documentation

## 2020-05-01 DIAGNOSIS — Z79899 Other long term (current) drug therapy: Secondary | ICD-10-CM | POA: Insufficient documentation

## 2020-05-01 LAB — COMPREHENSIVE METABOLIC PANEL
ALT: 16 U/L (ref 0–44)
AST: 29 U/L (ref 15–41)
Albumin: 3.5 g/dL (ref 3.5–5.0)
Alkaline Phosphatase: 49 U/L (ref 38–126)
Anion gap: 11 (ref 5–15)
BUN: 26 mg/dL — ABNORMAL HIGH (ref 6–20)
CO2: 24 mmol/L (ref 22–32)
Calcium: 9.4 mg/dL (ref 8.9–10.3)
Chloride: 101 mmol/L (ref 98–111)
Creatinine, Ser: 1.22 mg/dL (ref 0.61–1.24)
GFR calc Af Amer: >60 mL/min (ref >60)
GFR calc non Af Amer: >60 mL/min (ref >60)
Glucose, Bld: 142 mg/dL — ABNORMAL HIGH (ref 70–99)
Potassium: 4.2 mmol/L (ref 3.5–5.1)
Sodium: 136 mmol/L (ref 135–145)
Total Bilirubin: 0.8 mg/dL (ref 0.3–1.2)
Total Protein: 5.3 g/dL — ABNORMAL LOW (ref 6.5–8.1)

## 2020-05-01 LAB — CBC WITH DIFFERENTIAL/PLATELET
Abs Immature Granulocytes: 0.04 10*3/uL (ref 0.00–0.07)
Basophils Absolute: 0.1 10*3/uL (ref 0.0–0.1)
Basophils Relative: 1 %
Eosinophils Absolute: 0.1 10*3/uL (ref 0.0–0.5)
Eosinophils Relative: 1 %
HCT: 42.7 % (ref 39.0–52.0)
Hemoglobin: 14.3 g/dL (ref 13.0–17.0)
Immature Granulocytes: 0 %
Lymphocytes Relative: 21 %
Lymphs Abs: 2.9 10*3/uL (ref 0.7–4.0)
MCH: 30.4 pg (ref 26.0–34.0)
MCHC: 33.5 g/dL (ref 30.0–36.0)
MCV: 90.9 fL (ref 80.0–100.0)
Monocytes Absolute: 1.4 10*3/uL — ABNORMAL HIGH (ref 0.1–1.0)
Monocytes Relative: 10 %
Neutro Abs: 9.1 10*3/uL — ABNORMAL HIGH (ref 1.7–7.7)
Neutrophils Relative %: 67 %
Platelets: 322 10*3/uL (ref 150–400)
RBC: 4.7 MIL/uL (ref 4.22–5.81)
RDW: 14 % (ref 11.5–15.5)
WBC: 13.6 10*3/uL — ABNORMAL HIGH (ref 4.0–10.5)
nRBC: 0 % (ref 0.0–0.2)

## 2020-05-01 LAB — POCT URINE DRUG SCREEN - MANUAL ENTRY (I-SCREEN)
POC Amphetamine UR: NOT DETECTED
POC Buprenorphine (BUP): POSITIVE — AB
POC Cocaine UR: NOT DETECTED
POC Marijuana UR: POSITIVE — AB
POC Methadone UR: NOT DETECTED
POC Methamphetamine UR: NOT DETECTED
POC Morphine: NOT DETECTED
POC Oxazepam (BZO): NOT DETECTED
POC Oxycodone UR: NOT DETECTED
POC Secobarbital (BAR): NOT DETECTED

## 2020-05-01 MED ORDER — MIRTAZAPINE 15 MG PO TABS
15.0000 mg | ORAL_TABLET | Freq: Every day | ORAL | Status: DC
  Administered 2020-05-01 (×2): 15 mg via ORAL
  Filled 2020-05-01 (×2): qty 1

## 2020-05-01 MED ORDER — HYDROXYZINE HCL 25 MG PO TABS
25.0000 mg | ORAL_TABLET | Freq: Three times a day (TID) | ORAL | Status: DC | PRN
  Administered 2020-05-01 (×2): 25 mg via ORAL
  Filled 2020-05-01 (×3): qty 1

## 2020-05-01 MED ORDER — MAGNESIUM HYDROXIDE 400 MG/5ML PO SUSP: 30.0000 mL | Freq: Every day | ORAL | Status: DC | PRN

## 2020-05-01 MED ORDER — ACETAMINOPHEN 325 MG PO TABS: 650.0000 mg | ORAL_TABLET | Freq: Four times a day (QID) | ORAL | Status: DC | PRN

## 2020-05-01 MED ORDER — PANTOPRAZOLE SODIUM 40 MG PO TBEC
40.0000 mg | DELAYED_RELEASE_TABLET | Freq: Every day | ORAL | Status: DC
  Administered 2020-05-01 – 2020-05-02 (×2): 40 mg via ORAL
  Filled 2020-05-01 (×2): qty 1

## 2020-05-01 MED ORDER — METOCLOPRAMIDE HCL 10 MG PO TABS
10.0000 mg | ORAL_TABLET | Freq: Three times a day (TID) | ORAL | Status: DC
  Administered 2020-05-01 – 2020-05-02 (×3): 10 mg via ORAL
  Filled 2020-05-01 (×3): qty 1

## 2020-05-01 MED ORDER — ALUM & MAG HYDROXIDE-SIMETH 200-200-20 MG/5ML PO SUSP: 30.0000 mL | ORAL | Status: DC | PRN

## 2020-05-01 NOTE — ED Triage Notes (Signed)
Pt states he walked to [facility], walking all day to get here. Pt states he left [facility] 'too early'. States that when he started having SI this morning her knew he needed to come back in for help. No specific plan, says he was thinking of various things including swallowing heroin.

## 2020-05-01 NOTE — BH Assessment (Signed)
Comprehensive Clinical Assessment (CCA) Screening, Triage and Referral Note  05/01/2020 Steven Montoya [id number]   Steven Montoya is a 44 y.o. male with a history of MDD and polysubstance abuse who presents voluntarily to [facility] as a walkin for ongoing suicidal ideation. Pt states he was recently discharged from [facility] on 04/29/20 and is still feeling suicidal and depressed, he states that he does not have a specific plan to hurt him-self but states he would engage in anything to hurt himself tonight. Pt denies HI, AVH and SIB as well as no drug use.  Patient is prescribed suboxone for history of heroin/opiate use. Pt reports he does have a provider [facility] scheduled appointment on tomorrow at 9am. Pt reports he is prescribed psychiatric medications but has not got medications filled states he does not know why he has not been to pharmacy to get them filled. Pt reports getting no sleep and reports poor appetite. Patient states that he has been walking to get to [facility] since he was discharged from [facility] and transported home. Patient denies that he is homeless, however, I suspect there may be some housing issues considering that his [place]-time partner broke up with him last week. Pt denies violence or access to weapons at this time.   Disposition: Nira Conn, FNP recommends pt for continual assessment/observation at [facility]  Diagnosis: MDD, recurrent, severe     Visit Diagnosis:    ICD-10-CM   1. Severe recurrent major depression without psychotic features [facility])  F33.2     Patient Reported Information How did you hear about [place]? Self   Referral name: [employer]   Referral phone number: No data recorded Whom do you see for routine medical problems? I don't have a doctor   Practice/Facility Name: [facility]   Practice/Facility Phone Number: No data recorded  Name of Contact: No data recorded  Contact Number: No data recorded  Contact Fax Number: No data recorded  Prescriber  Name: No data recorded  Prescriber Address (if known): No data recorded What Is the Reason for Your Visit/Call Today? No data recorded How [place] Has This Been Causing You Problems? 1 wk - 1 month  Have You Recently Been in Any Inpatient Treatment (Hospital/Detox/Crisis Center/28-Day Program)? Yes   Name/Location of Program/Hospital:No data recorded  How [place] Were You There? No data recorded  When Were You Discharged? 04/29/20  Have You Ever Received Services From [employer] Before? Yes   Who Do You See at [facility]? No data recorded Have You Recently Had Any Thoughts About Hurting Yourself? Yes   Are You Planning to Commit Suicide/Harm Yourself At This time?  No  Have you Recently Had Thoughts About Hurting Someone Steven Montoya? No   Explanation: No data recorded Have You Used Any Alcohol or Drugs in the Past 24 Hours? No   How [place] Ago Did You Use Drugs or Alcohol?  No data recorded  What Did You Use and How Much? No data recorded What Do You Feel Would Help You the Most Today? Assessment Only  Do You Currently Have a Therapist/Psychiatrist? Yes   Name of Therapist/Psychiatrist: Zena Amos at [facility]   Have You Been Recently Discharged From Any Office Practice or Programs? No   Explanation of Discharge From Practice/Program:  No data recorded    CCA Screening Triage Referral Assessment Type of Contact: Face-to-Face   Is this Initial or Reassessment? No data recorded  Date Telepsych consult ordered in [facility]:  No  data recorded  Time Telepsych consult ordered in [facility]:  No data recorded Patient Reported Information Reviewed? Yes   Patient Left Without Being Seen? No data recorded  Reason for Not Completing Assessment: No data recorded Collateral Involvement: none reported  Does Patient Have a Court Appointed Legal Guardian? No data recorded  Name and Contact of Legal Guardian:  No data recorded If Minor and Not Living with Parent(s), Who has Custody? No data  recorded Is CPS involved or ever been involved? Never  Is APS involved or ever been involved? Never  Patient Determined To Be At Risk for Harm To Self or Others Based on Review of Patient Reported Information or Presenting Complaint? No   Method: No data recorded  Availability of Means: No data recorded  Intent: No data recorded  Notification Required: No data recorded  Additional Information for Danger to Others Potential:  No data recorded  Additional Comments for Danger to Others Potential:  No data recorded  Are There Guns or Other Weapons in Your Home?  No data recorded   Types of Guns/Weapons: No data recorded   Are These Weapons Safely Secured?                              No data recorded   Who Could Verify You Are Able To Have These Secured:    No data recorded Do You Have any Outstanding Charges, Pending Court Dates, Parole/Probation? No data recorded Contacted To Inform of Risk of Harm To Self or Others: Law Enforcement  Location of Assessment: [facility] Assessment Services  Does Patient Present under Involuntary Commitment? No   IVC Papers Initial File Date: No data recorded  [place] of Residence: [place]  Patient Currently Receiving the Following Services: Peer Support Services   Determination of Need: Urgent (48 hours)   Options For Referral: Overnight Observation  Natasha Mead, LCSWA

## 2020-05-01 NOTE — ED Provider Notes (Signed)
Behavioral Health Progress Note  Date and Time: 05/01/2020 2:03 PM Name: Steven Montoya. MRN:  [id number]  Subjective: Patient states "I have been feeling suicidal for 2 weeks, I have felt this way for most of my life." Patient reports he walked to the [facility] behavioral health facility for many miles, causing blisters to bilateral feet.   Patient reports depressed mood.  Patient endorses suicidal ideations, denies plan or intent.  Patient reports 9 previous suicide attempts.  Patient denies homicidal ideations.  Patient denies auditory visual hallucinations.  There is no indication the patient is responding to internal stimuli and no evidence of delusional thought content.  Patient denies symptoms of paranoia.  Patient reports he resides in [place] with his girlfriend.  Patient denies access to weapons.  Patient reports he currently receives disability benefits related to ADHD and depression.  Patient denies substance and alcohol use. 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  Patient reports compliance with Suboxone.  Patient reports he is [facility] by a [facility].  Patient states "I am supposed to take meds but I do not, I do not know the meds."  Patient gives verbal consent to speak with his mother Chrystine Oiler phone number [phone].  Attempted to telephone patient's mother unable to leave message, phone appears to be off.  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   Complex valvuloplasty including triangular resection of posterior leaflet, artificial Goretex neochords x6 and 34 mm Sorin Memo 3D ring annuloplasty via right mini thoracotomy approach   Urgency of urination    Urinary straining     Past Surgical History:  Procedure Laterality Date   CARDIAC SURGERY     COLONOSCOPY N/A 08/17/2013   Procedure: COLONOSCOPY;  Surgeon: Romie Levee, MD;  Location: [facility];  Service: Endoscopy;  Laterality: N/A;   CYSTO/ RETROGRADE PYELOGRAM/ URETHRAL DILATION  11-05-2010   MITRAL VALVE REPAIR Right 09/07/2017   Procedure: MINIMALLY INVASIVE MITRAL VALVE REPAIR (MVR) using Sorin Memo 3D Ring size 34;  Surgeon: Purcell Nails, MD;  Location: [facility] OR;  Service: Open Heart Surgery;  Laterality: Right;   RIGHT/LEFT HEART CATH AND CORONARY ANGIOGRAPHY N/A 07/22/2017   Procedure: RIGHT/LEFT HEART CATH AND CORONARY ANGIOGRAPHY;  Surgeon: Tonny Bollman, MD;  Location: [facility] INVASIVE CV LAB;  Service: Cardiovascular;  Laterality: N/A;   TEE WITHOUT CARDIOVERSION N/A 06/28/2017   Procedure: TRANSESOPHAGEAL ECHOCARDIOGRAM (TEE);  Surgeon: Jake Bathe, MD;  Location: [facility] ENDOSCOPY;  Service: Cardiovascular;  Laterality: N/A;   TEE WITHOUT CARDIOVERSION N/A 09/07/2017   Procedure: TRANSESOPHAGEAL ECHOCARDIOGRAM (TEE);  Surgeon: Purcell Nails, MD;  Location: [facility];  Service: Open Heart Surgery;  Laterality: N/A;   TOOTH EXTRACTION     canines   Family History:  Family History  Problem Relation Age of  Onset   Heart disease Father    Family Psychiatric  History: None reported Social History:  Social History   Substance and Sexual Activity  Alcohol Use Yes   Comment: Using for 25 years     Social History   Substance and Sexual Activity  Drug Use Not Currently   Types: Marijuana,  "Crack" cocaine, Methamphetamines   Comment: pt is in jail    Social History   Socioeconomic History   Marital status: Married    Spouse name: Not on file   Number of children: Not on file   Years of education: Not on file   Highest education level: Not on file  Occupational History   Not on file  Tobacco Use   Smoking status: Current Some Day Smoker   Smokeless tobacco: Never Used  Substance and Sexual Activity   Alcohol use: Yes    Comment: Using for 25 years   Drug use: Not Currently    Types: Marijuana, "Crack" cocaine, Methamphetamines    Comment: pt is in jail   Sexual activity: Not on file  Other Topics Concern   Not on file  Social History Narrative   ** Merged History Encounter **       Social Determinants of Health   Financial Resource Strain:    Difficulty of Paying Living Expenses: Not on file  Food Insecurity:    Worried About Programme researcher, broadcasting/film/video in the Last Year: Not on file   [employer] of Food in the Last Year: Not on file  Transportation Needs:    Lack of Transportation (Medical): Not on file   Lack of Transportation (Non-Medical): Not on file  Physical Activity:    Days of Exercise per Week: Not on file   Minutes of Exercise per Session: Not on file  Stress:    Feeling of Stress : Not on file  Social Connections:    Frequency of Communication with Friends and Family: Not on file   Frequency of Social Gatherings with Friends and Family: Not on file   Attends Religious Services: Not on file   Active Member of Clubs or Organizations: Not on file   Attends Banker Meetings: Not on file   Marital Status: Not on file   SDOH:  SDOH Screenings   Alcohol Screen: Low Risk    Last Alcohol Screening Score (AUDIT): 0  Depression (PHQ2-9):    PHQ-2 Score: Not on file  Financial Resource Strain:    Difficulty of Paying Living Expenses: Not on file  Food Insecurity:    Worried About Programme researcher, broadcasting/film/video in the Last  Year: Not on file   [employer] of Food in the Last Year: Not on file  Housing:    Last Housing Risk Score: Not on file  Physical Activity:    Days of Exercise per Week: Not on file   Minutes of Exercise per Session: Not on file  Social Connections:    Frequency of Communication with Friends and Family: Not on file   Frequency of Social Gatherings with Friends and Family: Not on file   Attends Religious Services: Not on file   Active Member of Clubs or Organizations: Not on file   Attends Banker Meetings: Not on file   Marital Status: Not on file  Stress:    Feeling of Stress : Not on file  Tobacco Use: High Risk   Smoking Tobacco Use: Current Some Day Smoker   Smokeless Tobacco Use:  Never Used  Transportation Needs:    Freight forwarder (Medical): Not on file   Lack of Transportation (Non-Medical): Not on file   Additional Social History:                         Sleep: Fair  Appetite:  Fair  Current Medications:  Current Facility-Administered Medications  Medication Dose Route Frequency Provider Last Rate Last Admin   acetaminophen (TYLENOL) tablet 650 mg  650 mg Oral Q6H PRN Jackelyn Poling, NP       alum & mag hydroxide-simeth (MAALOX/MYLANTA) 200-200-20 MG/5ML suspension 30 mL  30 mL Oral Q4H PRN Nira Conn A, NP       hydrOXYzine (ATARAX/VISTARIL) tablet 25 mg  25 mg Oral TID PRN Nira Conn A, NP   25 mg at 05/01/20 1009   magnesium hydroxide (MILK OF MAGNESIA) suspension 30 mL  30 mL Oral Daily PRN Jackelyn Poling, NP       metoCLOPramide (REGLAN) tablet 10 mg  10 mg Oral TID AC Nira Conn A, NP   10 mg at 05/01/20 1009   mirtazapine (REMERON) tablet 15 mg  15 mg Oral QHS Nira Conn A, NP   15 mg at 05/01/20 0309   pantoprazole (PROTONIX) EC tablet 40 mg  40 mg Oral Daily Nira Conn A, NP   40 mg at 05/01/20 1009   Current Outpatient Medications  Medication Sig Dispense Refill   SUBOXONE 8-2 MG FILM Place 1 strip  under the tongue 3 (three) times daily.      Labs  Lab Results:  Admission on 05/01/2020  Component Date Value Ref Range Status   WBC 05/01/2020 13.6* 4.0 - 10.5 K/uL Final   RBC 05/01/2020 4.70  4.22 - 5.81 MIL/uL Final   Hemoglobin 05/01/2020 14.3  13.0 - 17.0 g/dL Final   HCT 45/40/9811 42.7  39 - 52 % Final   MCV 05/01/2020 90.9  80.0 - 100.0 fL Final   MCH 05/01/2020 30.4  26.0 - 34.0 pg Final   MCHC 05/01/2020 33.5  30.0 - 36.0 g/dL Final   RDW 91/47/8295 14.0  11.5 - 15.5 % Final   Platelets 05/01/2020 322  150 - 400 K/uL Final   nRBC 05/01/2020 0.0  0.0 - 0.2 % Final   Neutrophils Relative % 05/01/2020 67  % Final   Neutro Abs 05/01/2020 9.1* 1.7 - 7.7 K/uL Final   Lymphocytes Relative 05/01/2020 21  % Final   Lymphs Abs 05/01/2020 2.9  0.7 - 4.0 K/uL Final   Monocytes Relative 05/01/2020 10  % Final   Monocytes Absolute 05/01/2020 1.4* 0 - 1 K/uL Final   Eosinophils Relative 05/01/2020 1  % Final   Eosinophils Absolute 05/01/2020 0.1  0 - 0 K/uL Final   Basophils Relative 05/01/2020 1  % Final   Basophils Absolute 05/01/2020 0.1  0 - 0 K/uL Final   Immature Granulocytes 05/01/2020 0  % Final   Abs Immature Granulocytes 05/01/2020 0.04  0.00 - 0.07 K/uL Final   Performed at [facility], [address] [place], [place] [address]   Sodium 05/01/2020 136  135 - 145 mmol/L Final   Potassium 05/01/2020 4.2  3.5 - 5.1 mmol/L Final   Chloride 05/01/2020 101  98 - 111 mmol/L Final   CO2 05/01/2020 24  22 - 32 mmol/L Final   Glucose, Bld 05/01/2020 142* 70 - 99 mg/dL Final   Glucose reference range applies  only to samples taken after fasting for at least 8 hours.   BUN 05/01/2020 26* 6 - 20 mg/dL Final   Creatinine, Ser 05/01/2020 1.22  0.61 - 1.24 mg/dL Final   Calcium 40/98/1191 9.4  8.9 - 10.3 mg/dL Final   Total Protein 47/82/9562 5.3* 6.5 - 8.1 g/dL Final   Albumin 13/03/6577 3.5  3.5 - 5.0 g/dL Final   AST 46/96/2952 29  15 - 41  U/L Final   ALT 05/01/2020 16  0 - 44 U/L Final   Alkaline Phosphatase 05/01/2020 49  38 - 126 U/L Final   Total Bilirubin 05/01/2020 0.8  0.3 - 1.2 mg/dL Final   GFR calc non Af Amer 05/01/2020 >60  >60 mL/min Final   GFR calc Af Amer 05/01/2020 >60  >60 mL/min Final   Anion gap 05/01/2020 11  5 - 15 Final   Performed at [facility], [address], [place], [place] [address]   POC Amphetamine UR 05/01/2020 None Detected  None Detected Final   POC Secobarbital (BAR) 05/01/2020 None Detected  None Detected Final   POC Buprenorphine (BUP) 05/01/2020 Positive* None Detected Final   POC Oxazepam (BZO) 05/01/2020 None Detected  None Detected Final   POC Cocaine UR 05/01/2020 None Detected  None Detected Final   POC Methamphetamine UR 05/01/2020 None Detected  None Detected Final   POC Morphine 05/01/2020 None Detected  None Detected Final   POC Oxycodone UR 05/01/2020 None Detected  None Detected Final   POC Methadone UR 05/01/2020 None Detected  None Detected Final   POC Marijuana UR 05/01/2020 Positive* None Detected Final  Admission on 04/24/2020, Discharged on 04/25/2020  Component Date Value Ref Range Status   SARS Coronavirus 2 04/24/2020 NEGATIVE  NEGATIVE Final   Comment: (NOTE) SARS-CoV-2 target nucleic acids are NOT DETECTED.  The SARS-CoV-2 RNA is generally detectable in upper and lower respiratory specimens during the acute phase of infection. The lowest concentration of SARS-CoV-2 viral copies this assay can detect is 250 copies / mL. A negative result does not preclude SARS-CoV-2 infection and should not be used as the sole basis for treatment or other patient management decisions.  A negative result may occur with improper specimen collection / handling, submission of specimen other than nasopharyngeal swab, presence of viral mutation(s) within the areas targeted by this assay, and inadequate number of viral copies (<250 copies / mL). A negative  result must be combined with clinical observations, patient history, and epidemiological information.  Fact Sheet for Patients:   BoilerBrush.com.cy  Fact Sheet for Healthcare Providers: https://pope.com/  This test is not yet approved or                           cleared by the [place] FDA and has been authorized for detection and/or diagnosis of SARS-CoV-2 by FDA under an Emergency Use Authorization (EUA).  This EUA will remain in effect (meaning this test can be used) for the duration of the COVID-19 declaration under Section 564(b)(1) of the Act, 21 U.S.C. section 360bbb-3(b)(1), unless the authorization is terminated or revoked sooner.  Performed at [facility], [address], [place], [place] [address]    SARS Coronavirus 2 Ag 04/24/2020 Negative  Negative Final   WBC 04/24/2020 12.9* 4.0 - 10.5 K/uL Final   RBC 04/24/2020 5.45  4.22 - 5.81 MIL/uL Final   Hemoglobin 04/24/2020 16.4  13.0 - 17.0 g/dL Final   HCT  04/24/2020 49.3  39 - 52 % Final   MCV 04/24/2020 90.5  80.0 - 100.0 fL Final   MCH 04/24/2020 30.1  26.0 - 34.0 pg Final   MCHC 04/24/2020 33.3  30.0 - 36.0 g/dL Final   RDW 16/05/9603 14.7  11.5 - 15.5 % Final   Platelets 04/24/2020 364  150 - 400 K/uL Final   nRBC 04/24/2020 0.0  0.0 - 0.2 % Final   Neutrophils Relative % 04/24/2020 79  % Final   Neutro Abs 04/24/2020 10.2* 1.7 - 7.7 K/uL Final   Lymphocytes Relative 04/24/2020 15  % Final   Lymphs Abs 04/24/2020 2.0  0.7 - 4.0 K/uL Final   Monocytes Relative 04/24/2020 5  % Final   Monocytes Absolute 04/24/2020 0.6  0 - 1 K/uL Final   Eosinophils Relative 04/24/2020 0  % Final   Eosinophils Absolute 04/24/2020 0.0  0 - 0 K/uL Final   Basophils Relative 04/24/2020 1  % Final   Basophils Absolute 04/24/2020 0.1  0 - 0 K/uL Final   Immature Granulocytes 04/24/2020 0  % Final   Abs Immature Granulocytes 04/24/2020 0.04  0.00 -  0.07 K/uL Final   Performed at [facility], [address] [place], [place] [address]   Sodium 04/24/2020 136  135 - 145 mmol/L Final   Potassium 04/24/2020 3.8  3.5 - 5.1 mmol/L Final   Chloride 04/24/2020 100  98 - 111 mmol/L Final   CO2 04/24/2020 24  22 - 32 mmol/L Final   Glucose, Bld 04/24/2020 93  70 - 99 mg/dL Final   Glucose reference range applies only to samples taken after fasting for at least 8 hours.   BUN 04/24/2020 15  6 - 20 mg/dL Final   Creatinine, Ser 04/24/2020 1.15  0.61 - 1.24 mg/dL Final   Calcium 11/91/4782 9.5  8.9 - 10.3 mg/dL Final   Total Protein 95/62/1308 6.2* 6.5 - 8.1 g/dL Final   Albumin 65/78/4696 3.9  3.5 - 5.0 g/dL Final   AST 29/52/8413 23  15 - 41 U/L Final   ALT 04/24/2020 14  0 - 44 U/L Final   Alkaline Phosphatase 04/24/2020 42  38 - 126 U/L Final   Total Bilirubin 04/24/2020 0.9  0.3 - 1.2 mg/dL Final   GFR calc non Af Amer 04/24/2020 >60  >60 mL/min Final   GFR calc Af Amer 04/24/2020 >60  >60 mL/min Final   Anion gap 04/24/2020 12  5 - 15 Final   Performed at [facility], [address], [place], [place] [address]   TSH 04/24/2020 2.481  0.350 - 4.500 uIU/mL Final   Comment: Performed by a 3rd Generation assay with a functional sensitivity of <=0.01 uIU/mL. Performed at [facility], [address] [place], [place] [address]    POC Amphetamine UR 04/24/2020 None Detected  None Detected Final   POC Secobarbital (BAR) 04/24/2020 None Detected  None Detected Final   POC Buprenorphine (BUP) 04/24/2020 Positive* None Detected Final   POC Oxazepam (BZO) 04/24/2020 None Detected  None Detected Final   POC Cocaine UR 04/24/2020 None Detected  None Detected Final   POC Methamphetamine UR 04/24/2020 None Detected  None Detected Final   POC Morphine 04/24/2020 None Detected  None Detected Final   POC Oxycodone UR 04/24/2020 None Detected  None Detected Final   POC Methadone UR 04/24/2020 None Detected   None Detected Final   POC Marijuana UR 04/24/2020 Positive* None Detected Final  SARS Coronavirus 2 Ag 04/24/2020 NEGATIVE  NEGATIVE Final   Comment: (NOTE) SARS-CoV-2 antigen NOT DETECTED.   Negative results are presumptive.  Negative results do not preclude SARS-CoV-2 infection and should not be used as the sole basis for treatment or other patient management decisions, including infection  control decisions, particularly in the presence of clinical signs and  symptoms consistent with COVID-19, or in those who have been in contact with the virus.  Negative results must be combined with clinical observations, patient history, and epidemiological information. The expected result is Negative.  Fact Sheet for Patients: https://sanders-williams.net/  Fact Sheet for Healthcare Providers: https://martinez.com/   This test is not yet approved or cleared by the [place] FDA and  has been authorized for detection and/or diagnosis of SARS-CoV-2 by FDA under an Emergency Use Authorization (EUA).  This EUA will remain in effect (meaning this test can be used) for the duration of  the C                          OVID-19 declaration under Section 564(b)(1) of the Act, 21 U.S.C. section 360bbb-3(b)(1), unless the authorization is terminated or revoked sooner.    Admission on 01/03/2020, Discharged on 01/03/2020  Component Date Value Ref Range Status   Sodium 01/03/2020 140  135 - 145 mmol/L Final   Potassium 01/03/2020 3.8  3.5 - 5.1 mmol/L Final   Chloride 01/03/2020 105  98 - 111 mmol/L Final   CO2 01/03/2020 23  22 - 32 mmol/L Final   Glucose, Bld 01/03/2020 82  70 - 99 mg/dL Final   Glucose reference range applies only to samples taken after fasting for at least 8 hours.   BUN 01/03/2020 12  6 - 20 mg/dL Final   Creatinine, Ser 01/03/2020 1.06  0.61 - 1.24 mg/dL Final   Calcium 40/98/1191 9.4  8.9 - 10.3 mg/dL Final   Total Protein  01/03/2020 6.3* 6.5 - 8.1 g/dL Final   Albumin 47/82/9562 3.7  3.5 - 5.0 g/dL Final   AST 13/03/6577 81* 15 - 41 U/L Final   ALT 01/03/2020 59* 0 - 44 U/L Final   Alkaline Phosphatase 01/03/2020 54  38 - 126 U/L Final   Total Bilirubin 01/03/2020 0.9  0.3 - 1.2 mg/dL Final   GFR calc non Af Amer 01/03/2020 >60  >60 mL/min Final   GFR calc Af Amer 01/03/2020 >60  >60 mL/min Final   Anion gap 01/03/2020 12  5 - 15 Final   Performed at [facility], [address] [place], [place] [address]   Sodium 01/03/2020 140  135 - 145 mmol/L Final   Potassium 01/03/2020 3.6  3.5 - 5.1 mmol/L Final   Chloride 01/03/2020 105  98 - 111 mmol/L Final   BUN 01/03/2020 12  6 - 20 mg/dL Final   Creatinine, Ser 01/03/2020 1.00  0.61 - 1.24 mg/dL Final   Glucose, Bld 95/28/4132 75  70 - 99 mg/dL Final   Glucose reference range applies only to samples taken after fasting for at least 8 hours.   Calcium, Ion 01/03/2020 1.14* 1.15 - 1.40 mmol/L Final   TCO2 01/03/2020 25  22 - 32 mmol/L Final   Hemoglobin 01/03/2020 14.6  13.0 - 17.0 g/dL Final   HCT 44/08/270 43.0  39 - 52 % Final   WBC 01/03/2020 10.7* 4.0 - 10.5 K/uL Final   RBC 01/03/2020 4.60  4.22 - 5.81 MIL/uL Final  Hemoglobin 01/03/2020 14.1  13.0 - 17.0 g/dL Final   HCT 16/10/960405/01/2020 40.8  39 - 52 % Final   MCV 01/03/2020 88.7  80.0 - 100.0 fL Final   MCH 01/03/2020 30.7  26.0 - 34.0 pg Final   MCHC 01/03/2020 34.6  30.0 - 36.0 g/dL Final   RDW 54/09/811905/01/2020 14.1  11.5 - 15.5 % Final   Platelets 01/03/2020 315  150 - 400 K/uL Final   nRBC 01/03/2020 0.0  0.0 - 0.2 % Final   Performed at [facility], [address] [place], [place] [address]   Alcohol, Ethyl (B) 01/03/2020 <10  <10 mg/dL Final   Comment: (NOTE) Lowest detectable limit for serum alcohol is 10 mg/dL. For medical purposes only. Performed at [facility], [address] [place], [place] [address]    Lactic Acid, Venous 01/03/2020 0.8   0.5 - 1.9 mmol/L Final   Performed at [facility], [address] [place], [place] [address]   Prothrombin Time 01/03/2020 12.8  11.4 - 15.2 seconds Final   INR 01/03/2020 1.0  0.8 - 1.2 Final   Comment: (NOTE) INR goal varies based on device and disease states. Performed at [facility], [address] [place], [place] [address]    Blood Bank Specimen 01/03/2020 SAMPLE AVAILABLE FOR TESTING   Final   Sample Expiration 01/03/2020    Final                   Value:01/04/2020,2359 Performed at [facility], [address] [place], [place] [address]    CDS serology specimen 01/03/2020 SPECIMEN WILL BE HELD FOR 14 DAYS IF TESTING IS REQUIRED   Final   Performed at [facility], [address] [place], [place] [address]    Blood Alcohol level:  Lab Results  Component Value Date   ETH <10 01/03/2020   ETH <10 11/08/2017    Metabolic Disorder Labs: Lab Results  Component Value Date   HGBA1C 5.0 09/05/2017   MPG 96.8 09/05/2017   No results found for: PROLACTIN No results found for: CHOL, TRIG, HDL, CHOLHDL, VLDL, LDLCALC  Therapeutic Lab Levels: No results found for: LITHIUM No results found for: VALPROATE No components found for:  CBMZ  Physical Findings   AIMS     [facility]  AIMS Total Score 0    AUDIT     [facility] Admission ([facility]charged[facility]  Alcohol Use Disorder Identification Test Final Score (AUDIT) 0 35       Musculoskeletal  Strength & Muscle Tone: within normal limits Gait & Station: normal Patient leans: N/A  Psychiatric Specialty Exam  Presentation  General Appearance: Appropriate for Environment;Well Groomed;Casual  Eye Contact:Minimal  Speech:Clear and Coherent;Normal Rate  Speech  Volume:Decreased  Handedness:Right   Mood and Affect  Mood:Anxious;Depressed;Hopeless;Worthless  Affect:Congruent;Depressed;Tearful   Thought Process  Thought Processes:Coherent;Goal Directed;Linear  Descriptions of Associations:Circumstantial  Orientation:Full (Time, Place and Person)  Thought Content:WDL  Hallucinations:Hallucinations: None  Ideas of Reference:None  Suicidal Thoughts:Suicidal Thoughts: Yes, Active SI Active Intent and/or Plan: With Intent;With Plan;With Means to Carry Out  Homicidal Thoughts:Homicidal Thoughts: No   Sensorium  Memory:Immediate Good;Recent Good;Remote Good  Judgment:Fair  Insight:Fair   Executive Functions  Concentration:Fair  Attention Span:Fair  Recall:Good  Fund of Knowledge:Good  Language:Good  Psychomotor Activity  Psychomotor Activity:Psychomotor Activity: Normal   Assets  Assets:Communication Skills;Desire for Improvement;Financial Resources/Insurance;Physical Health   Sleep  Sleep:Sleep: Poor   Physical Exam  Physical Exam Review of Systems  Constitutional: Positive for malaise/fatigue.  HENT: Negative.  Negative for congestion and sore throat.   Eyes: Negative.  Negative for blurred vision, double vision and redness.  Respiratory: Negative.  Negative for cough, shortness of breath and wheezing.   Cardiovascular: Negative for chest pain.  Gastrointestinal: Negative.  Negative for abdominal pain, diarrhea, heartburn and nausea.  Musculoskeletal: Positive for myalgias. Negative for back pain, falls, joint pain and neck pain.  Skin:       blisters  Neurological: Negative.  Negative for dizziness, seizures, loss of consciousness and headaches.  Psychiatric/Behavioral: Positive for depression and suicidal ideas. Negative for hallucinations, memory loss and substance abuse. The patient is not nervous/anxious and does not have insomnia.    Blood pressure 110/89, pulse 100, temperature 97.9 F (36.6 C),  temperature source Oral, resp. rate 18, height 5' 9.29" (1.76 m), weight 134 lb (60.8 kg), SpO2 99 %. Body mass index is 19.62 kg/m.  Treatment Plan Summary: Daily contact with patient to assess and evaluate symptoms and progress in treatment and Medication management  Patrcia Dolly, FNP 05/01/2020 2:03 PM

## 2020-05-01 NOTE — ED Notes (Signed)
Steven Montoya. Belongings including,a wallet and  a pair of sneakers are placed in locker number 31.

## 2020-05-01 NOTE — ED Notes (Signed)
Pt given salad and cheez-itz

## 2020-05-01 NOTE — ED Notes (Signed)
Cereal and a cup of soda was giving to the patient.

## 2020-05-01 NOTE — ED Notes (Signed)
Pt asleep with even and unlabored respirations. No distress or discomfort noted. Pt remains safe on the unit. Will continue to monitor. 

## 2020-05-01 NOTE — ED Notes (Signed)
Pt A&O x 4, no distress noted, cooperative, and anxious requesting  Suboxone., pt informed NP will talk to pt in reference to medication regimen.  Monitoring for safety,.

## 2020-05-01 NOTE — ED Provider Notes (Signed)
Behavioral Health Admission [facility]&P [facility])  Date: 05/01/20 Patient Name: Steven Montoya. MRN: [id number] Chief Complaint:  Chief Complaint  Patient presents with  . Suicidal      Diagnoses:  Final diagnoses:  Severe recurrent major depression without psychotic features (HCC)    HPI: Steven Montoya is a 44 y.o. male with a history of MDD and polysubstance abuse who presents to [facility] due to worsening depression and suicidal thoughts with a plan to "do whatever it takes to kill myself." Patient presented to [facility] on 04/25/2020 after law enforcement found him on a bridge contemplating suicide. Patient was transferred to [facility] for inpatient treatment. He was discharged from [facility] on 04/29/2020. Patient has a follow up appointment at [facility] on 05/01/2020 at 0900 am. Patient states that he did not have his prescriptions filled after leaving [facility]. Patient states "I thought I was going to be okay when I left. But, I'm not, I can't make it. I can't get these thoughts out of my head." Clarified that he was referring to suicidal thoughts. Patient states that he has been walking to get to [facility] since he was discharged from [facility] and transported home. Patient denies that he is homeless, however, I suspect there may be some housing issues considering that his [employer]-time partner broke up with him last week.  He denies use of illicit substances, other than marijuana prior to admission to [facility]. Denies alcohol use. Patient is prescribed suboxone for history of heroin/opiate use. States that he has not used in several years.He denies homicidal ideations. Denies auditory and visual hallucinations. No indication that he is responding to internal stimuli.  PHQ 2-9:     [facility]24/2020 in [facility]  C-SSRS RISK CATEGORY High Risk High Risk       Total Time spent with patient: 20  minutes  Musculoskeletal  Strength & Muscle Tone: within normal limits Gait & Station: normal Patient leans: N/A  Psychiatric Specialty Exam  Presentation General Appearance: Appropriate for Environment;Well Groomed;Casual  Eye Contact:Minimal  Speech:Clear and Coherent;Normal Rate  Speech Volume:Decreased  Handedness:Right   Mood and Affect  Mood:Anxious;Depressed;Hopeless;Worthless  Affect:Congruent;Depressed;Tearful   Thought Process  Thought Processes:Coherent;Goal Directed;Linear  Descriptions of Associations:Circumstantial  Orientation:Full (Time, Place and Person)  Thought Content:WDL  Hallucinations:Hallucinations: None  Ideas of Reference:None  Suicidal Thoughts:Suicidal Thoughts: Yes, Active SI Active Intent and/or Plan: With Intent;With Plan;With Means to Carry Out  Homicidal Thoughts:Homicidal Thoughts: No   Sensorium  Memory:Immediate Good;Recent Good;Remote Good  Judgment:Fair  Insight:Fair   Executive Functions  Concentration:Fair  Attention Span:Fair  Recall:Good  Fund of Knowledge:Good  Language:Good   Psychomotor Activity  Psychomotor Activity:Psychomotor Activity: Normal   Assets  Assets:Communication Skills;Desire for Improvement;Financial Resources/Insurance;Physical Health   Sleep  Sleep:Sleep: Poor   Physical Exam Constitutional:      General: He is not in acute distress.    Appearance: He is not ill-appearing, toxic-appearing or diaphoretic.  HENT:     Head: Normocephalic.     Right Ear: External ear normal.     Left Ear: External ear normal.  Eyes:     Pupils: Pupils are equal, round, and reactive to light.  Cardiovascular:     Rate and Rhythm: Normal rate.  Pulmonary:     Effort: Pulmonary effort is normal. No respiratory distress.  Musculoskeletal:        General: Normal range of motion.  Skin:  General: Skin is warm and dry.     Comments: Small blisters bilateral heals  Neurological:      Mental Status: He is alert and oriented to person, place, and time.  Psychiatric:        Mood and Affect: Mood is anxious and depressed.        Speech: Speech normal.        Behavior: Behavior is cooperative.        Thought Content: Thought content is not paranoid or delusional. Thought content includes suicidal ideation. Thought content does not include homicidal ideation. Thought content includes suicidal plan.    Review of Systems  Constitutional: Negative for chills, diaphoresis, fever, malaise/fatigue and weight loss.  HENT: Negative for congestion.   Respiratory: Negative for cough and shortness of breath.   Cardiovascular: Negative for chest pain and palpitations.  Gastrointestinal: Negative for diarrhea, nausea and vomiting.  Neurological: Negative for dizziness and seizures.  Psychiatric/Behavioral: Positive for depression and suicidal ideas. Negative for hallucinations, memory loss and substance abuse. The patient is nervous/anxious and has insomnia.   All other systems reviewed and are negative.   Blood pressure (!) 125/97, pulse 97, temperature 98 F (36.7 C), temperature source Tympanic, resp. rate 18, height 5' 9.29" (1.76 m), weight 134 lb (60.8 kg), SpO2 96 %. Body mass index is 19.62 kg/m.  Past Psychiatric History: Polysubstance use disorder, Major depressive disorder.  Is the patient at risk to self? Yes  Has the patient been a risk to self in the past 6 months? Yes .    Has the patient been a risk to self within the distant past? Yes   Is the patient a risk to others? No   Has the patient been a risk to others in the past 6 months? No   Has the patient been a risk to others within the distant past? No   Past Medical History:  Past Medical History:  Diagnosis Date  . ADHD   . Anxiety   . Chronic urethral stricture   . Depression   . Diverticulitis   . Frequency of urination   . Headache(784.0)   . Lower abdominal pain   . Mitral valve prolapse   .  Nocturia   . PTSD (post-traumatic stress disorder)   . 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 Urgency of urination   . Urinary straining     Past Surgical History:  Procedure Laterality Date  . CARDIAC SURGERY    . COLONOSCOPY N/A 08/17/2013   Procedure: COLONOSCOPY;  Surgeon: Romie Levee, MD;  Location: [facility] ENDOSCOPY;  Service: Endoscopy;  Laterality: N/A;  . CYSTO/ RETROGRADE PYELOGRAM/ URETHRAL DILATION  11-05-2010  . MITRAL VALVE REPAIR Right 09/07/2017   Procedure: MINIMALLY INVASIVE MITRAL VALVE REPAIR (MVR) using Sorin Memo 3D Ring size 34;  Surgeon: Purcell Nails, MD;  Location: [facility] OR;  Service: Open Heart Surgery;  Laterality: Right;  . RIGHT/LEFT HEART CATH AND CORONARY ANGIOGRAPHY N/A 07/22/2017   Procedure: RIGHT/LEFT HEART CATH AND CORONARY ANGIOGRAPHY;  Surgeon: Tonny Bollman, MD;  Location: [facility] INVASIVE CV LAB;  Service: Cardiovascular;  Laterality: N/A;  . TEE WITHOUT CARDIOVERSION N/A 06/28/2017   Procedure: TRANSESOPHAGEAL ECHOCARDIOGRAM (TEE);  Surgeon: Jake Bathe, MD;  Location: [facility] ENDOSCOPY;  Service: Cardiovascular;  Laterality: N/A;  . TEE WITHOUT CARDIOVERSION N/A 09/07/2017  Procedure: TRANSESOPHAGEAL ECHOCARDIOGRAM (TEE);  Surgeon: Purcell Nails, MD;  Location: [facility] OR;  Service: Open Heart Surgery;  Laterality: N/A;  . TOOTH EXTRACTION     canines    Family History:  Family History  Problem Relation Age of Onset  . Heart disease Father     Social History:  Social History   Socioeconomic History  . Marital status: Married    Spouse name: Not on file  . Number of children: Not on file  . Years of education: Not on file  . Highest education level: Not on file  Occupational History  . Not on file  Tobacco Use  . Smoking status: Current Some Day  Smoker  . Smokeless tobacco: Never Used  Substance and Sexual Activity  . Alcohol use: Yes    Comment: Using for 25 years  . Drug use: Not Currently    Types: Marijuana, "Crack" cocaine, Methamphetamines    Comment: pt is in jail  . Sexual activity: Not on file  Other Topics Concern  . Not on file  Social History Narrative   ** Merged History Encounter **       Social Determinants of Health   Financial Resource Strain:   . Difficulty of Paying Living Expenses: Not on file  Food Insecurity:   . Worried About Programme researcher, broadcasting/film/video in the Last Year: Not on file  . Ran Out of Food in the Last Year: Not on file  Transportation Needs:   . Lack of Transportation (Medical): Not on file  . Lack of Transportation (Non-Medical): Not on file  Physical Activity:   . Days of Exercise per Week: Not on file  . Minutes of Exercise per Session: Not on file  Stress:   . Feeling of Stress : Not on file  Social Connections:   . Frequency of Communication with Friends and Family: Not on file  . Frequency of Social Gatherings with Friends and Family: Not on file  . Attends Religious Services: Not on file  . Active Member of Clubs or Organizations: Not on file  . Attends Banker Meetings: Not on file  . Marital Status: Not on file  Intimate Partner Violence:   . Fear of Current or Ex-Partner: Not on file  . Emotionally Abused: Not on file  . Physically Abused: Not on file  . Sexually Abused: Not on file    SDOH:  SDOH Screenings   Alcohol Screen: Low Risk   . Last Alcohol Screening Score (AUDIT): 0  Depression (PHQ2-9):   . PHQ-2 Score: Not on file  Financial Resource Strain:   . Difficulty of Paying Living Expenses: Not on file  Food Insecurity:   . Worried About Programme researcher, broadcasting/film/video in the Last Year: Not on file  . Ran Out of Food in the Last Year: Not on file  Housing:   . Last Housing Risk Score: Not on file  Physical Activity:   . Days of Exercise per Week: Not on  file  . Minutes of Exercise per Session: Not on file  Social Connections:   . Frequency of Communication with Friends and Family: Not on file  . Frequency of Social Gatherings with Friends and Family: Not on file  . Attends Religious Services: Not on file  . Active Member of Clubs or Organizations: Not on file  . Attends Banker Meetings: Not on file  . Marital Status: Not on file  Stress:   . Feeling of Stress :  Not on file  Tobacco Use: High Risk  . Smoking Tobacco Use: Current Some Day Smoker  . Smokeless Tobacco Use: Never Used  Transportation Needs:   . Freight forwarder (Medical): Not on file  . Lack of Transportation (Non-Medical): Not on file    Last Labs:  Admission on 05/01/2020  Component Date Value Ref Range Status  . POC Amphetamine UR 05/01/2020 None Detected  None Detected Final  . POC Secobarbital (BAR) 05/01/2020 None Detected  None Detected Final  . POC Buprenorphine (BUP) 05/01/2020 Positive* None Detected Final  . POC Oxazepam (BZO) 05/01/2020 None Detected  None Detected Final  . POC Cocaine UR 05/01/2020 None Detected  None Detected Final  . POC Methamphetamine UR 05/01/2020 None Detected  None Detected Final  . POC Morphine 05/01/2020 None Detected  None Detected Final  . POC Oxycodone UR 05/01/2020 None Detected  None Detected Final  . POC Methadone UR 05/01/2020 None Detected  None Detected Final  . POC Marijuana UR 05/01/2020 Positive* None Detected Final  Admission on 04/24/2020, Discharged on 04/25/2020  Component Date Value Ref Range Status  . SARS Coronavirus 2 04/24/2020 NEGATIVE  NEGATIVE Final   Comment: (NOTE) SARS-CoV-2 target nucleic acids are NOT DETECTED.  The SARS-CoV-2 RNA is generally detectable in upper and lower respiratory specimens during the acute phase of infection. The lowest concentration of SARS-CoV-2 viral copies this assay can detect is 250 copies / mL. A negative result does not preclude SARS-CoV-2  infection and should not be used as the sole basis for treatment or other patient management decisions.  A negative result may occur with improper specimen collection / handling, submission of specimen other than nasopharyngeal swab, presence of viral mutation(s) within the areas targeted by this assay, and inadequate number of viral copies (<250 copies / mL). A negative result must be combined with clinical observations, patient history, and epidemiological information.  Fact Sheet for Patients:   BoilerBrush.com.cy  Fact Sheet for Healthcare Providers: https://pope.com/  This test is not yet approved or                           cleared by the [place] FDA and has been authorized for detection and/or diagnosis of SARS-CoV-2 by FDA under an Emergency Use Authorization (EUA).  This EUA will remain in effect (meaning this test can be used) for the duration of the COVID-19 declaration under Section 564(b)(1) of the Act, 21 U.S.C. section 360bbb-3(b)(1), unless the authorization is terminated or revoked sooner.  Performed at [facility], [address] [place], [place] [address]   . SARS Coronavirus 2 Ag 04/24/2020 Negative  Negative Final  . WBC 04/24/2020 12.9* 4.0 - 10.5 K/uL Final  . RBC 04/24/2020 5.45  4.22 - 5.81 MIL/uL Final  . Hemoglobin 04/24/2020 16.4  13.0 - 17.0 g/dL Final  . HCT 01/60/1093 49.3  39 - 52 % Final  . MCV 04/24/2020 90.5  80.0 - 100.0 fL Final  . MCH 04/24/2020 30.1  26.0 - 34.0 pg Final  . MCHC 04/24/2020 33.3  30.0 - 36.0 g/dL Final  . RDW 23/55/7322 14.7  11.5 - 15.5 % Final  . Platelets 04/24/2020 364  150 - 400 K/uL Final  . nRBC 04/24/2020 0.0  0.0 - 0.2 % Final  . Neutrophils Relative % 04/24/2020 79  % Final  . Neutro Abs 04/24/2020 10.2* 1.7 - 7.7 K/uL Final  . Lymphocytes Relative 04/24/2020 15  %  Final  . Lymphs Abs 04/24/2020 2.0  0.7 - 4.0 K/uL Final  . Monocytes Relative  04/24/2020 5  % Final  . Monocytes Absolute 04/24/2020 0.6  0 - 1 K/uL Final  . Eosinophils Relative 04/24/2020 0  % Final  . Eosinophils Absolute 04/24/2020 0.0  0 - 0 K/uL Final  . Basophils Relative 04/24/2020 1  % Final  . Basophils Absolute 04/24/2020 0.1  0 - 0 K/uL Final  . Immature Granulocytes 04/24/2020 0  % Final  . Abs Immature Granulocytes 04/24/2020 0.04  0.00 - 0.07 K/uL Final   Performed at [facility], [address], [place], [place] [address]  . Sodium 04/24/2020 136  135 - 145 mmol/L Final  . Potassium 04/24/2020 3.8  3.5 - 5.1 mmol/L Final  . Chloride 04/24/2020 100  98 - 111 mmol/L Final  . CO2 04/24/2020 24  22 - 32 mmol/L Final  . Glucose, Bld 04/24/2020 93  70 - 99 mg/dL Final   Glucose reference range applies only to samples taken after fasting for at least 8 hours.  . BUN 04/24/2020 15  6 - 20 mg/dL Final  . Creatinine, Ser 04/24/2020 1.15  0.61 - 1.24 mg/dL Final  . Calcium 19/14/7829 9.5  8.9 - 10.3 mg/dL Final  . Total Protein 04/24/2020 6.2* 6.5 - 8.1 g/dL Final  . Albumin 56/21/3086 3.9  3.5 - 5.0 g/dL Final  . AST 57/84/6962 23  15 - 41 U/L Final  . ALT 04/24/2020 14  0 - 44 U/L Final  . Alkaline Phosphatase 04/24/2020 42  38 - 126 U/L Final  . Total Bilirubin 04/24/2020 0.9  0.3 - 1.2 mg/dL Final  . GFR calc non Af Amer 04/24/2020 >60  >60 mL/min Final  . GFR calc Af Amer 04/24/2020 >60  >60 mL/min Final  . Anion gap 04/24/2020 12  5 - 15 Final   Performed at [facility], [address] [place], [place] [address]  . TSH 04/24/2020 2.481  0.350 - 4.500 uIU/mL Final   Comment: Performed by a 3rd Generation assay with a functional sensitivity of <=0.01 uIU/mL. Performed at [facility], [address], [place], [place] [address]   . POC Amphetamine UR 04/24/2020 None Detected  None Detected Final  . POC Secobarbital (BAR) 04/24/2020 None Detected  None Detected Final  . POC Buprenorphine (BUP) 04/24/2020 Positive* None Detected  Final  . POC Oxazepam (BZO) 04/24/2020 None Detected  None Detected Final  . POC Cocaine UR 04/24/2020 None Detected  None Detected Final  . POC Methamphetamine UR 04/24/2020 None Detected  None Detected Final  . POC Morphine 04/24/2020 None Detected  None Detected Final  . POC Oxycodone UR 04/24/2020 None Detected  None Detected Final  . POC Methadone UR 04/24/2020 None Detected  None Detected Final  . POC Marijuana UR 04/24/2020 Positive* None Detected Final  . SARS Coronavirus 2 Ag 04/24/2020 NEGATIVE  NEGATIVE Final   Comment: (NOTE) SARS-CoV-2 antigen NOT DETECTED.   Negative results are presumptive.  Negative results do not preclude SARS-CoV-2 infection and should not be used as the sole basis for treatment or other patient management decisions, including infection  control decisions, particularly in the presence of clinical signs and  symptoms consistent with COVID-19, or in those who have been in contact with the virus.  Negative results must be combined with clinical observations, patient history, and epidemiological information. The expected result is Negative.  Fact Sheet for Patients: https://sanders-williams.net/  Fact Sheet for Healthcare Providers: https://martinez.com/https://www.fda.gov/media/139753/download   This test is not yet approved or cleared by the [place] FDA and  has been authorized for detection and/or diagnosis of SARS-CoV-2 by FDA under an Emergency Use Authorization (EUA).  This EUA will remain in effect (meaning this test can be used) for the duration of  the C                          OVID-19 declaration under Section 564(b)(1) of the Act, 21 U.S.C. section 360bbb-3(b)(1), unless the authorization is terminated or revoked sooner.    Admission on 01/03/2020, Discharged on 01/03/2020  Component Date Value Ref Range Status  . Sodium 01/03/2020 140  135 - 145 mmol/L Final  . Potassium 01/03/2020 3.8  3.5 - 5.1 mmol/L Final  . Chloride 01/03/2020 105   98 - 111 mmol/L Final  . CO2 01/03/2020 23  22 - 32 mmol/L Final  . Glucose, Bld 01/03/2020 82  70 - 99 mg/dL Final   Glucose reference range applies only to samples taken after fasting for at least 8 hours.  . BUN 01/03/2020 12  6 - 20 mg/dL Final  . Creatinine, Ser 01/03/2020 1.06  0.61 - 1.24 mg/dL Final  . Calcium 16/10/960405/01/2020 9.4  8.9 - 10.3 mg/dL Final  . Total Protein 01/03/2020 6.3* 6.5 - 8.1 g/dL Final  . Albumin 54/09/811905/01/2020 3.7  3.5 - 5.0 g/dL Final  . AST 14/78/295605/01/2020 81* 15 - 41 U/L Final  . ALT 01/03/2020 59* 0 - 44 U/L Final  . Alkaline Phosphatase 01/03/2020 54  38 - 126 U/L Final  . Total Bilirubin 01/03/2020 0.9  0.3 - 1.2 mg/dL Final  . GFR calc non Af Amer 01/03/2020 >60  >60 mL/min Final  . GFR calc Af Amer 01/03/2020 >60  >60 mL/min Final  . Anion gap 01/03/2020 12  5 - 15 Final   Performed at [facility], [address] [place], [place] [address]  . Sodium 01/03/2020 140  135 - 145 mmol/L Final  . Potassium 01/03/2020 3.6  3.5 - 5.1 mmol/L Final  . Chloride 01/03/2020 105  98 - 111 mmol/L Final  . BUN 01/03/2020 12  6 - 20 mg/dL Final  . Creatinine, Ser 01/03/2020 1.00  0.61 - 1.24 mg/dL Final  . Glucose, Bld 65/78/469605/01/2020 75  70 - 99 mg/dL Final   Glucose reference range applies only to samples taken after fasting for at least 8 hours.  . Calcium, Ion 01/03/2020 1.14* 1.15 - 1.40 mmol/L Final  . TCO2 01/03/2020 25  22 - 32 mmol/L Final  . Hemoglobin 01/03/2020 14.6  13.0 - 17.0 g/dL Final  . HCT 29/52/841305/01/2020 43.0  39 - 52 % Final  . WBC 01/03/2020 10.7* 4.0 - 10.5 K/uL Final  . RBC 01/03/2020 4.60  4.22 - 5.81 MIL/uL Final  . Hemoglobin 01/03/2020 14.1  13.0 - 17.0 g/dL Final  . HCT 24/40/102705/01/2020 40.8  39 - 52 % Final  . MCV 01/03/2020 88.7  80.0 - 100.0 fL Final  . MCH 01/03/2020 30.7  26.0 - 34.0 pg Final  . MCHC 01/03/2020 34.6  30.0 - 36.0 g/dL Final  . RDW 25/36/644005/01/2020 14.1  11.5 - 15.5 % Final  . Platelets 01/03/2020 315  150 - 400 K/uL Final  . nRBC  01/03/2020 0.0  0.0 - 0.2 % Final   Performed at [facility], [address] [place], [place] [address]  .  Alcohol, Ethyl (B) 01/03/2020 <10  <10 mg/dL Final   Comment: (NOTE) Lowest detectable limit for serum alcohol is 10 mg/dL. For medical purposes only. Performed at [facility], [address], [place], [place] [address]   . Lactic Acid, Venous 01/03/2020 0.8  0.5 - 1.9 mmol/L Final   Performed at [facility], [address], [place], [place] [address]  . Prothrombin Time 01/03/2020 12.8  11.4 - 15.2 seconds Final  . INR 01/03/2020 1.0  0.8 - 1.2 Final   Comment: (NOTE) INR goal varies based on device and disease states. Performed at [facility], [address], [place], [place] [address]   . Blood Bank Specimen 01/03/2020 SAMPLE AVAILABLE FOR TESTING   Final  . Sample Expiration 01/03/2020    Final                   Value:01/04/2020,2359 Performed at [facility], [address], [place], [place] [address]   . CDS serology specimen 01/03/2020 SPECIMEN WILL BE HELD FOR 14 DAYS IF TESTING IS REQUIRED   Final   Performed at [facility], [address], [place], [place] [address]    Allergies: Latex and Adhesive [tape]  PTA Medications: (Not in a hospital admission)   Medical Decision Making  Admission labs ordered  Continued home medications  Discussed early morning discharge so that he can make is 0900 appointment at [facility]. Patient states that he feels like he will continue to have suicidal thoughts and not be ready for discharge in time to make his appointment.     Recommendations  Based on my evaluation the patient does not appear to have an emergency medical condition.   Patient will be placed in the continuous assessment area at [facility] for treatment and stabilization. He will be reevaluated on 05/01/2020. The treatment team will determine disposition at that time.      Jackelyn Poling, NP 05/01/20  3:36 AM

## 2020-05-01 NOTE — ED Notes (Signed)
Patient is given salad and additional  [place] creamy [place] dressing

## 2020-05-02 NOTE — ED Notes (Signed)
Pt sleeping at present, no distress noted, monitoring for safety. 

## 2020-05-02 NOTE — ED Notes (Signed)
Pt alert and cooperative. Tolerated am medications without difficulty. Eating cereal and drinking milk presently. Safety maintained.

## 2020-05-02 NOTE — ED Notes (Signed)
Pt resting in no acute distress. Safety maintained 

## 2020-05-02 NOTE — ED Notes (Signed)
Pt laying down resting with eyes closed. Easily aroused. Flat, sad. Pt states, "I just have a lot going on in my life right now". Forwards little information. Denies SI/HI. Informed pt to notify staff if urges/impulses to harm self arise. Pt verbalizes agreement. S[place]ety maintained.

## 2020-05-02 NOTE — ED Notes (Signed)
Patient A&O x 4, ambulatory. Patient discharged in no acute distress. Patient denied SI/HI, A/VH upon discharge. Patient verbalized understanding of all discharge instructions explained by staff, to include follow up care and safety plan. Patient reported mood 10/10.  Pt belongings returned to patient from locker #31 intact. Patient escorted to lobby via staff. Safety maintained.

## 2020-05-02 NOTE — Discharge Instructions (Signed)
Outpatient Psychiatry and/or Counseling  [facility] * (medication management and therapy) [address] [place], [place] [address] [phone]  [facility] (Therapy only)* (takes [place] and most major insurances) [place]:  [address] [facility] [place], [place]  Phone: [phone] [place]:  [address], [place], [place] [address]   Phone: [phone] [place]:  [address], [place], [place] [address]  Phone: [phone] [facility]  (specializes in trauma therapy, substance abuse and mood disorders. Takes patients without insurance for little to no cost out of pocket. Also takes most major insurances) [address],  [place], [place] [address] [phone] [facility] * (therapy and medication management) [address] [place], [place] [address] [phone]  [facility] (Medication Management) [address] [place], [place] [address] [phone]  [facility] * (therapy only) [address] [place], [place] [address] [phone] [facility] [address] [place] [address] [phone] [facility] * [address] [place], [place] [address] [phone] [facility]* (accepts non-insured)  [address] [place], [place] [address] [phone]  Walk in Monday-Friday 8am-3pm [facility] [place]* (takes sliding scale and Medicaid as well as other major insurances)  [place]:   [address] [place]  Phone: [phone] [place]:   [facility] [address] [place], [place]  Phone: [phone] Walk in Monday-Friday 830am-230pm [facility]* (accepts [employer] non-insured) [address] [place], [place] [address] [phone] Walk in Monday-Friday 8am-5pm

## 2020-05-02 NOTE — ED Provider Notes (Addendum)
FBC/OBS ASAP Discharge Summary  Date and Time: 05/02/2020 10:19 AM  Name: Steven Montoya.  MRN:  [id number]   Discharge Diagnoses:  Final diagnoses:  Severe recurrent major depression without psychotic features (HCC)    Subjective: Patient states "I need to get out of here so that I can reach out to people my friends and family, I cannot do anything here."  Patient assessed by nurse practitioner.  Patient alert and oriented, answers appropriately.  Patient pleasant cooperative during assessment.  Patient denies suicidal homicidal ideations today.  Patient denies auditory visual hallucinations.  There is no evidence of delusional thought content and patient does not appear to be responding to internal stimuli.  Patient denies symptoms of paranoia. Patient continues to report depressed mood, patient believes he would benefit from antidepressant medication as well as counseling.Patient restarted Remeron 15mg  QHS after off medications "for a while."   Patient reports he typically resides with his fiance however he is unable to return home at this time related to arguments with fianc.  Patient reports he believes once he is discharged he will reconcile and return to her home.  Patient reports plan to reside at the [facility] as he has the money to pay for motel room per his report.  Patient denies access to weapons.  Patient is disabled and receives disability income.  Patient reports he is [facility] by [facility] treated with Suboxone and has been sober from alcohol and substance times approximately 4 years.  Patient reports while he has access to counseling at [facility] he has not participated but plans to begin counseling. Patient offered support and encouragement. Discussed attempting to call patient's fianc for collateral information, patient refuses.  Patient denies any person to attempt to telephone code for collateral information.  Stay Summary:  Per H&P: [name]  is a 44 y.o. male with a history of MDD and polysubstance abuse who presents to [facility] due to worsening depression and suicidal thoughts with a plan to "do whatever it takes to kill myself." Patient presented to [facility] on 04/25/2020 after law enforcement found him on a bridge contemplating suicide. Patient was transferred to [facility] for inpatient treatment. He was discharged from [facility] on 04/29/2020. Patient has a follow up appointment at [facility] on 05/01/2020 at 0900 am. Patient states that he did not have his prescriptions filled after leaving [facility]. Patient states "I thought I was going to be okay when I left. But, I'm not, I can't make it. I can't get these thoughts out of my head." Clarified that he was referring to suicidal thoughts. Patient states that he has been walking to get to [facility] since he was discharged from [facility] and transported home. Patient denies that he is homeless, however, I suspect there may be some housing issues considering that his [place]-time partner broke up with him last week.  He denies use of illicit substances, other than marijuana prior to admission to [facility]. Denies alcohol use. Patient is prescribed suboxone for history of heroin/opiate use. States that he has not used in several years.He denies homicidal ideations. Denies auditory and visual hallucinations. No indication that he is responding to internal stimuli.  Total Time spent with patient: 20 minutes  Past Psychiatric History: Major depressive disorder, opioid dependence, other stimulant dependence with stimulant-induced mood disorder Past Medical History:  Past Medical History:  Diagnosis Date  . ADHD   . Anxiety   . Chronic urethral stricture   . Depression   . Diverticulitis   .  Frequency of urination   . Headache(784.0)   . Lower abdominal pain   . Mitral valve prolapse   . Nocturia   . PTSD (post-traumatic stress disorder)   . S/P minimally invasive mitral valve repair 09/07/2017   Complex valvuloplasty including  triangular resection of posterior leaflet, artificial Goretex neochords x6 and 34 mm Sorin Memo 3D ring annuloplasty via right mini thoracotomy approach  . Urgency of urination   . Urinary straining     Past Surgical History:  Procedure Laterality Date  . CARDIAC SURGERY    . COLONOSCOPY N/A 08/17/2013   Procedure: COLONOSCOPY;  Surgeon: Romie Levee, MD;  Location: [facility] ENDOSCOPY;  Service: Endoscopy;  Laterality: N/A;  . CYSTO/ RETROGRADE PYELOGRAM/ URETHRAL DILATION  11-05-2010  . MITRAL VALVE REPAIR Right 09/07/2017   Procedure: MINIMALLY INVASIVE MITRAL VALVE REPAIR (MVR) using Sorin Memo 3D Ring size 34;  Surgeon: Purcell Nails, MD;  Location: [facility] OR;  Service: Open Heart Surgery;  Laterality: Right;  . RIGHT/LEFT HEART CATH AND CORONARY ANGIOGRAPHY N/A 07/22/2017   Procedure: RIGHT/LEFT HEART CATH AND CORONARY ANGIOGRAPHY;  Surgeon: Tonny Bollman, MD;  Location: [facility] INVASIVE CV LAB;  Service: Cardiovascular;  Laterality: N/A;  . TEE WITHOUT CARDIOVERSION N/A 06/28/2017   Procedure: TRANSESOPHAGEAL ECHOCARDIOGRAM (TEE);  Surgeon: Jake Bathe, MD;  Location: [facility] ENDOSCOPY;  Service: Cardiovascular;  Laterality: N/A;  . TEE WITHOUT CARDIOVERSION N/A 09/07/2017   Procedure: TRANSESOPHAGEAL ECHOCARDIOGRAM (TEE);  Surgeon: Purcell Nails, MD;  Location: [facility] OR;  Service: Open Heart Surgery;  Laterality: N/A;  . TOOTH EXTRACTION     canines   Family History:  Family History  Problem Relation Age of Onset  . Heart disease Father    Family Psychiatric History: None reported Social History:  Social History   Substance and Sexual Activity  Alcohol Use Yes   Comment: Using for 25 years     Social History   Substance and Sexual Activity  Drug Use Not Currently  . Types: Marijuana, "Crack" cocaine, Methamphetamines   Comment: pt is in jail    Social History   Socioeconomic History  . Marital status: Married    Spouse name: Not on file  . Number of children: Not on file  .  Years of education: Not on file  . Highest education level: Not on file  Occupational History  . Not on file  Tobacco Use  . Smoking status: Current Some Day Smoker  . Smokeless tobacco: Never Used  Substance and Sexual Activity  . Alcohol use: Yes    Comment: Using for 25 years  . Drug use: Not Currently    Types: Marijuana, "Crack" cocaine, Methamphetamines    Comment: pt is in jail  . Sexual activity: Not on file  Other Topics Concern  . Not on file  Social History Narrative   ** Merged History Encounter **       Social Determinants of Health   Financial Resource Strain:   . Difficulty of Paying Living Expenses: Not on file  Food Insecurity:   . Worried About Programme researcher, broadcasting/film/video in the Last Year: Not on file  . Ran Out of Food in the Last Year: Not on file  Transportation Needs:   . Lack of Transportation (Medical): Not on file  . Lack of Transportation (Non-Medical): Not on file  Physical Activity:   . Days of Exercise per Week: Not on file  . Minutes of Exercise per Session: Not on file  Stress:   .  Feeling of Stress : Not on file  Social Connections:   . Frequency of Communication with Friends and Family: Not on file  . Frequency of Social Gatherings with Friends and Family: Not on file  . Attends Religious Services: Not on file  . Active Member of Clubs or Organizations: Not on file  . Attends Banker Meetings: Not on file  . Marital Status: Not on file   SDOH:  SDOH Screenings   Alcohol Screen: Low Risk   . Last Alcohol Screening Score (AUDIT): 0  Depression (PHQ2-9):   . PHQ-2 Score: Not on file  Financial Resource Strain:   . Difficulty of Paying Living Expenses: Not on file  Food Insecurity:   . Worried About Programme researcher, broadcasting/film/video in the Last Year: Not on file  . Ran Out of Food in the Last Year: Not on file  Housing:   . Last Housing Risk Score: Not on file  Physical Activity:   . Days of Exercise per Week: Not on file  . Minutes of  Exercise per Session: Not on file  Social Connections:   . Frequency of Communication with Friends and Family: Not on file  . Frequency of Social Gatherings with Friends and Family: Not on file  . Attends Religious Services: Not on file  . Active Member of Clubs or Organizations: Not on file  . Attends Banker Meetings: Not on file  . Marital Status: Not on file  Stress:   . Feeling of Stress : Not on file  Tobacco Use: High Risk  . Smoking Tobacco Use: Current Some Day Smoker  . Smokeless Tobacco Use: Never Used  Transportation Needs:   . Freight forwarder (Medical): Not on file  . Lack of Transportation (Non-Medical): Not on file    Has this patient used any form of tobacco in the last 30 days? (Cigarettes, Smokeless Tobacco, Cigars, and/or Pipes) A prescription for an FDA-approved tobacco cessation medication was offered at discharge and the patient refused  Current Medications:  Current Facility-Administered Medications  Medication Dose Route Frequency Provider Last Rate Last Admin  . acetaminophen (TYLENOL) tablet 650 mg  650 mg Oral Q6H PRN Nira Conn A, NP      . alum & mag hydroxide-simeth (MAALOX/MYLANTA) 200-200-20 MG/5ML suspension 30 mL  30 mL Oral Q4H PRN Nira Conn A, NP      . hydrOXYzine (ATARAX/VISTARIL) tablet 25 mg  25 mg Oral TID PRN Jackelyn Poling, NP   25 mg at 05/01/20 2120  . magnesium hydroxide (MILK OF MAGNESIA) suspension 30 mL  30 mL Oral Daily PRN Nira Conn A, NP      . metoCLOPramide (REGLAN) tablet 10 mg  10 mg Oral TID AC Nira Conn A, NP   10 mg at 05/02/20 0846  . mirtazapine (REMERON) tablet 15 mg  15 mg Oral QHS Nira Conn A, NP   15 mg at 05/01/20 2120  . pantoprazole (PROTONIX) EC tablet 40 mg  40 mg Oral Daily Nira Conn A, NP   40 mg at 05/02/20 6568   Current Outpatient Medications  Medication Sig Dispense Refill  . SUBOXONE 8-2 MG FILM Place 1 strip under the tongue 3 (three) times daily.      PTA  Medications: (Not in a hospital admission)   Musculoskeletal  Strength & Muscle Tone: within normal limits Gait & Station: normal Patient leans: N/A  Psychiatric Specialty Exam  Presentation  General Appearance: Appropriate for Environment;Well Groomed;Casual  Eye Contact:Minimal  Speech:Clear and Coherent;Normal Rate  Speech Volume:Decreased  Handedness:Right   Mood and Affect  Mood:Anxious;Depressed;Hopeless;Worthless  Affect:Congruent;Depressed;Tearful   Thought Process  Thought Processes:Coherent;Goal Directed;Linear  Descriptions of Associations:Circumstantial  Orientation:Full (Time, Place and Person)  Thought Content:WDL  Hallucinations:Hallucinations: None  Ideas of Reference:None  Suicidal Thoughts:Suicidal Thoughts: Yes, Active SI Active Intent and/or Plan: With Intent;With Plan;With Means to Carry Out  Homicidal Thoughts:Homicidal Thoughts: No   Sensorium  Memory:Immediate Good;Recent Good;Remote Good  Judgment:Fair  Insight:Fair   Executive Functions  Concentration:Fair  Attention Span:Fair  Recall:Good  Fund of Knowledge:Good  Language:Good   Psychomotor Activity  Psychomotor Activity:Psychomotor Activity: Normal   Assets  Assets:Communication Skills;Desire for Improvement;Financial Resources/Insurance;Physical Health   Sleep  Sleep:Sleep: Poor   Physical Exam  Physical Exam Vitals and nursing note reviewed.  Constitutional:      Appearance: He is well-developed.  HENT:     Head: Normocephalic.  Cardiovascular:     Rate and Rhythm: Normal rate.  Pulmonary:     Effort: Pulmonary effort is normal.  Neurological:     Mental Status: He is alert and oriented to person, place, and time.  Psychiatric:        Attention and Perception: Attention and perception normal.        Mood and Affect: Affect normal. Mood is depressed.        Speech: Speech normal.        Behavior: Behavior normal. Behavior is cooperative.         Thought Content: Thought content normal.        Cognition and Memory: Cognition and memory normal.        Judgment: Judgment normal.    Review of Systems  Constitutional: Negative.   HENT: Negative.   Eyes: Negative.   Respiratory: Negative.   Cardiovascular: Negative.   Gastrointestinal: Negative.   Genitourinary: Negative.   Musculoskeletal: Negative.   Skin: Negative.   Neurological: Negative.   Endo/Heme/Allergies: Negative.   Psychiatric/Behavioral: Positive for depression.   Blood pressure 138/80, pulse 73, temperature 97.8 F (36.6 C), temperature source Oral, resp. rate 18, height 5' 9.29" (1.76 m), weight 134 lb (60.8 kg), SpO2 100 %. Body mass index is 19.62 kg/m.  Demographic Factors:  Male and Caucasian  Loss Factors: NA  Historical Factors: NA  Risk Reduction Factors:   Responsible for children under 44 years of age, Living with another person, especially a relative, Positive social support, Positive therapeutic relationship and Positive coping skills or problem solving skills  Continued Clinical Symptoms:  Alcohol/Substance Abuse/Dependencies  Cognitive Features That Contribute To Risk:  None    Suicide Risk:  Minimal: No identifiable suicidal ideation.  Patients presenting with no risk factors but with morbid ruminations; may be classified as minimal risk based on the severity of the depressive symptoms  Plan Of Care/Follow-up recommendations:  Other:  Patient reviewed with Dr. Nelly RoutArchana Kumar.   Follow-up with established outpatient psychiatry [facility] also [facility] and medication management.     Disposition:   Patrcia Dollyina L Tate, FNP 05/02/2020, 10:19 AM

## 2020-05-06 ENCOUNTER — Ambulatory Visit (HOSPITAL_COMMUNITY)
Admission: EM | Admit: 2020-05-06 | Discharge: 2020-05-06 | Disposition: A | Discharge status: Other Acute Inpatient Hospital | Payer: Medicaid Other | Attending: Registered Nurse | Admitting: Registered Nurse

## 2020-05-06 ENCOUNTER — Encounter (HOSPITAL_COMMUNITY): Payer: Self-pay | Admitting: Emergency Medicine

## 2020-05-06 ENCOUNTER — Emergency Department (HOSPITAL_COMMUNITY)
Admission: EM | Admit: 2020-05-06 | Discharge: 2020-05-06 | Disposition: A | Discharge status: Home / Self Care | Payer: Medicaid Other | Attending: Emergency Medicine | Admitting: Emergency Medicine

## 2020-05-06 ENCOUNTER — Encounter (HOSPITAL_COMMUNITY): Payer: Self-pay | Admitting: Registered Nurse

## 2020-05-06 ENCOUNTER — Ambulatory Visit (HOSPITAL_COMMUNITY)
Admission: EM | Admit: 2020-05-06 | Discharge: 2020-05-07 | Disposition: A | Discharge status: Home / Self Care | Payer: Medicaid Other | Source: Home / Self Care

## 2020-05-06 ENCOUNTER — Other Ambulatory Visit: Payer: Self-pay

## 2020-05-06 ENCOUNTER — Emergency Department (HOSPITAL_COMMUNITY): Payer: Medicaid Other

## 2020-05-06 DIAGNOSIS — Z6372 Alcoholism and drug addiction in family: Secondary | ICD-10-CM | POA: Insufficient documentation

## 2020-05-06 DIAGNOSIS — F1910 Other psychoactive substance abuse, uncomplicated: Secondary | ICD-10-CM | POA: Diagnosis present

## 2020-05-06 DIAGNOSIS — R45851 Suicidal ideations: Secondary | ICD-10-CM | POA: Insufficient documentation

## 2020-05-06 DIAGNOSIS — F17210 Nicotine dependence, cigarettes, uncomplicated: Secondary | ICD-10-CM | POA: Insufficient documentation

## 2020-05-06 DIAGNOSIS — Z630 Problems in relationship with spouse or partner: Secondary | ICD-10-CM | POA: Insufficient documentation

## 2020-05-06 DIAGNOSIS — F332 Major depressive disorder, recurrent severe without psychotic features: Secondary | ICD-10-CM

## 2020-05-06 DIAGNOSIS — Z591 Inadequate housing: Secondary | ICD-10-CM | POA: Insufficient documentation

## 2020-05-06 DIAGNOSIS — T50902A Poisoning by unspecified drugs, medicaments and biological substances, intentional self-harm, initial encounter: Secondary | ICD-10-CM | POA: Insufficient documentation

## 2020-05-06 DIAGNOSIS — F1990 Other psychoactive substance use, unspecified, uncomplicated: Secondary | ICD-10-CM | POA: Insufficient documentation

## 2020-05-06 DIAGNOSIS — Z20822 Contact with and (suspected) exposure to covid-19: Secondary | ICD-10-CM | POA: Insufficient documentation

## 2020-05-06 DIAGNOSIS — Z753 Unavailability and inaccessibility of health-care facilities: Secondary | ICD-10-CM | POA: Insufficient documentation

## 2020-05-06 LAB — CBC WITH DIFFERENTIAL/PLATELET
Abs Immature Granulocytes: 0.01 10*3/uL (ref 0.00–0.07)
Basophils Absolute: 0.1 10*3/uL (ref 0.0–0.1)
Basophils Relative: 1 %
Eosinophils Absolute: 0.2 10*3/uL (ref 0.0–0.5)
Eosinophils Relative: 3 %
HCT: 44.5 % (ref 39.0–52.0)
Hemoglobin: 15.2 g/dL (ref 13.0–17.0)
Immature Granulocytes: 0 %
Lymphocytes Relative: 31 %
Lymphs Abs: 2.4 10*3/uL (ref 0.7–4.0)
MCH: 31 pg (ref 26.0–34.0)
MCHC: 34.2 g/dL (ref 30.0–36.0)
MCV: 90.8 fL (ref 80.0–100.0)
Monocytes Absolute: 0.7 10*3/uL (ref 0.1–1.0)
Monocytes Relative: 9 %
Neutro Abs: 4.2 10*3/uL (ref 1.7–7.7)
Neutrophils Relative %: 56 %
Platelets: 336 10*3/uL (ref 150–400)
RBC: 4.9 MIL/uL (ref 4.22–5.81)
RDW: 13.8 % (ref 11.5–15.5)
WBC: 7.6 10*3/uL (ref 4.0–10.5)
nRBC: 0 % (ref 0.0–0.2)

## 2020-05-06 LAB — RAPID URINE DRUG SCREEN, HOSP PERFORMED
Amphetamines: POSITIVE — AB
Barbiturates: NOT DETECTED
Benzodiazepines: NOT DETECTED
Cocaine: POSITIVE — AB
Opiates: NOT DETECTED
Tetrahydrocannabinol: POSITIVE — AB

## 2020-05-06 LAB — ETHANOL: Alcohol, Ethyl (B): <10 mg/dL (ref <10)

## 2020-05-06 LAB — ACETAMINOPHEN LEVEL: Acetaminophen (Tylenol), Serum: <10 ug/mL — ABNORMAL LOW (ref 10–30)

## 2020-05-06 LAB — CBG MONITORING, ED: Glucose-Capillary: 90 mg/dL (ref 70–99)

## 2020-05-06 LAB — COMPREHENSIVE METABOLIC PANEL
ALT: 20 U/L (ref 0–44)
AST: 30 U/L (ref 15–41)
Albumin: 3.7 g/dL (ref 3.5–5.0)
Alkaline Phosphatase: 46 U/L (ref 38–126)
Anion gap: 9 (ref 5–15)
BUN: 16 mg/dL (ref 6–20)
CO2: 27 mmol/L (ref 22–32)
Calcium: 9.3 mg/dL (ref 8.9–10.3)
Chloride: 106 mmol/L (ref 98–111)
Creatinine, Ser: 1.04 mg/dL (ref 0.61–1.24)
GFR calc Af Amer: >60 mL/min (ref >60)
GFR calc non Af Amer: >60 mL/min (ref >60)
Glucose, Bld: 79 mg/dL (ref 70–99)
Potassium: 4.4 mmol/L (ref 3.5–5.1)
Sodium: 142 mmol/L (ref 135–145)
Total Bilirubin: 0.7 mg/dL (ref 0.3–1.2)
Total Protein: 6.1 g/dL — ABNORMAL LOW (ref 6.5–8.1)

## 2020-05-06 LAB — SARS CORONAVIRUS 2 BY RT PCR (HOSPITAL ORDER, PERFORMED IN ~~LOC~~ HOSPITAL LAB): SARS Coronavirus 2: NEGATIVE

## 2020-05-06 LAB — SALICYLATE LEVEL: Salicylate Lvl: <7 mg/dL — ABNORMAL LOW (ref 7.0–30.0)

## 2020-05-06 NOTE — ED Triage Notes (Signed)
Pt brought to [facility] from [facility] - Steven Montoya was being see for suicidal ideation and was brought to them by [employer] after pt verbalized overdose on meth 2 days ago to get help for SI that has been going on for sometime now and also reports cutting self.

## 2020-05-06 NOTE — ED Provider Notes (Signed)
[name]-EMERGENCY DEPT Provider Note   CSN: [id number] Arrival date & time: 05/06/20  1855     History Chief Complaint  Patient presents with  . Suicidal  . Medical Clearance    Steven Montoya. is a 44 y.o. male who presents for evaluation of suicidal ideation.  Patient arrived with police from behavioral health urgent care for evaluation of suicidal ideation.  During their initial assessment, patient verbalized that he swallowed meth and cocaine 2 days ago for possible overdose.  He states that he swallowed the powder and it was not in any container or bag.  He states that the overdose did not work so that he was going to cut himself today to kill himself.  Patient reports that he has been stressed.  He states that he has been sober but has recently had some things going on that have caused him to relapse.  Additionally, he states his wife is involved in drugs.  He states that his family members "will stay out of business."  He he denies any HI, auditory or visualizations.  Denies any chest pain, difficulty breathing, difficulty swallowing, abdominal pain, nausea/vomiting.  The history is provided by the patient.       Past Medical History:  Diagnosis Date  . ADHD   . Anxiety   . Chronic urethral stricture   . Depression   . Diverticulitis   . Frequency of urination   . Headache(784.0)   . Lower abdominal pain   . Mitral valve prolapse   . Nocturia   . PTSD (post-traumatic stress disorder)   . 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 Urgency of urination   . Urinary straining     Patient Active Problem List   Diagnosis Date Noted  . Major depressive disorder, recurrent episode, severe (HCC) 04/26/2020  . Opiate dependence (HCC) 04/25/2020  . S/P  minimally invasive mitral valve repair 09/07/2017  . MVP (mitral valve prolapse) 10/02/2016  . Non-rheumatic mitral regurgitation 10/02/2016  . Smoker 10/02/2016  . 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 Depressive disorder 12/31/2015  . Rectal bleeding 07/25/2013    Past Surgical History:  Procedure Laterality Date  . CARDIAC SURGERY    . COLONOSCOPY N/A 08/17/2013   Procedure: COLONOSCOPY;  Surgeon: Romie Levee, MD;  Location: [facility] ENDOSCOPY;  Service: Endoscopy;  Laterality: N/A;  . CYSTO/ RETROGRADE PYELOGRAM/ URETHRAL DILATION  11-05-2010  . MITRAL VALVE REPAIR Right 09/07/2017   Procedure: MINIMALLY INVASIVE MITRAL VALVE REPAIR (MVR) using Sorin Memo 3D Ring size 34;  Surgeon: Purcell Nails, MD;  Location: [facility] OR;  Service: Open Heart Surgery;  Laterality: Right;  . RIGHT/LEFT HEART CATH AND CORONARY ANGIOGRAPHY N/A 07/22/2017   Procedure: RIGHT/LEFT HEART CATH AND CORONARY ANGIOGRAPHY;  Surgeon: Tonny Bollman, MD;  Location: [facility] INVASIVE CV LAB;  Service: Cardiovascular;  Laterality: N/A;  . TEE WITHOUT CARDIOVERSION N/A 06/28/2017   Procedure: TRANSESOPHAGEAL ECHOCARDIOGRAM (TEE);  Surgeon: Jake Bathe, MD;  Location: [facility] ENDOSCOPY;  Service: Cardiovascular;  Laterality: N/A;  . TEE WITHOUT CARDIOVERSION N/A 09/07/2017   Procedure: TRANSESOPHAGEAL ECHOCARDIOGRAM (TEE);  Surgeon: Purcell Nails, MD;  Location: [facility] OR;  Service: Open Heart Surgery;  Laterality: N/A;  . TOOTH EXTRACTION     canines       Family  History  Problem Relation Age of Onset  . Heart disease Father     Social History   Tobacco Use  . Smoking status: Current Some Day Smoker  . Smokeless tobacco: Never Used  Substance Use Topics  . Alcohol use: Yes    Comment: Using for 25 years  . Drug use: Not Currently    Types: Marijuana, "Crack" cocaine, Methamphetamines    Comment: pt is in jail    Home Medications Prior to Admission medications     Medication Sig Start Date End Date Taking? Authorizing Provider  mirtazapine (REMERON) 15 MG tablet Take 15 mg by mouth at bedtime.   Yes [provider]  SUBOXONE 8-2 MG FILM Place 1 strip under the tongue 3 (three) times daily. Patient not taking: Reported on 05/06/2020 04/19/20   [provider]    Allergies    Latex and Adhesive [tape]  Review of Systems   Review of Systems  Unable to perform ROS: Psychiatric disorder    Physical Exam Updated Vital Signs BP (!) 120/96 (BP Location: Left Arm)   Pulse 78   Temp 98.5 F (36.9 C) (Oral)   Resp 20   SpO2 99%   Physical Exam Vitals and nursing note reviewed.  Constitutional:      Appearance: Normal appearance. He is well-developed.  HENT:     Head: Normocephalic and atraumatic.  Eyes:     General: Lids are normal.     Conjunctiva/sclera: Conjunctivae normal.     Pupils: Pupils are equal, round, and reactive to light.  Cardiovascular:     Rate and Rhythm: Normal rate and regular rhythm.     Pulses: Normal pulses.     Heart sounds: Normal heart sounds. No murmur heard.  No friction rub. No gallop.   Pulmonary:     Effort: Pulmonary effort is normal.     Breath sounds: Normal breath sounds.     Comments: Lungs clear to auscultation bilaterally.  Symmetric chest rise.  No wheezing, rales, rhonchi. Abdominal:     Palpations: Abdomen is soft. Abdomen is not rigid.     Tenderness: There is no abdominal tenderness. There is no guarding.     Comments: Abdomen is soft, non-distended, non-tender. No rigidity, No guarding. No peritoneal signs.  Musculoskeletal:        General: Normal range of motion.     Cervical back: Full passive range of motion without pain.  Skin:    General: Skin is warm and dry.     Capillary Refill: Capillary refill takes less than 2 seconds.  Neurological:     Mental Status: He is alert and oriented to person, place, and time.  Psychiatric:        Speech: Speech normal.         Thought Content: Thought content includes suicidal ideation. Thought content does not include homicidal ideation. Thought content includes suicidal plan. Thought content does not include homicidal plan.        Cognition and Memory: Abnormal remote memory:      Comments: Positive SI.      ED Results / Procedures / Treatments   Labs (all labs ordered are listed, but only abnormal results are displayed) Labs Reviewed  COMPREHENSIVE METABOLIC PANEL - Abnormal; Notable for the following components:      Result Value   Total Protein 6.1 (*)    All other components within normal limits  ACETAMINOPHEN LEVEL - Abnormal; Notable for the following components:   Acetaminophen (Tylenol),  Serum <10 (*)    All other components within normal limits  SALICYLATE LEVEL - Abnormal; Notable for the following components:   Salicylate Lvl <7.0 (*)    All other components within normal limits  RAPID URINE DRUG SCREEN, HOSP PERFORMED - Abnormal; Notable for the following components:   Cocaine POSITIVE (*)    Amphetamines POSITIVE (*)    Tetrahydrocannabinol POSITIVE (*)    All other components within normal limits  SARS CORONAVIRUS 2 BY RT PCR (HOSPITAL ORDER, PERFORMED IN [facility])  ETHANOL  CBC WITH DIFFERENTIAL/PLATELET  CBC  CBG MONITORING, ED    EKG None  Radiology DG Abdomen Acute W/Chest  Result Date: 05/06/2020 CLINICAL DATA:  Swallowed foreign body. EXAM: DG ABDOMEN ACUTE W/ 1V CHEST COMPARISON:  None. FINDINGS: There is no evidence of dilated bowel loops or free intraperitoneal air. No radiopaque calculi or other significant radiographic abnormality is seen. Heart size and mediastinal contours are within normal limits. Both lungs are clear. Prior valve replacement. No radiopaque foreign bodies. IMPRESSION: Negative abdominal radiographs.  No acute cardiopulmonary disease. Electronically Signed   By: Charlett Nose M.D.   On: 05/06/2020 19:40    Procedures Procedures (including  critical care time)  Medications Ordered in ED Medications - No data to display  ED Course  I have reviewed the triage vital signs and the nursing notes.  Pertinent labs & imaging results that were available during my care of the patient were reviewed by me and considered in my medical decision making (see chart for details).    MDM Rules/Calculators/A&P                          44 year old male who presents for evaluation of suicidal ideation.  He initially presented to urgent care for suicidal ideations but did verbalize that he ingested some powder 2 days ago as an attempt to overdose and was sent to the ED for further evaluation of medical clearance.  On initial ED arrival, he is afebrile, nontoxic-appearing.  Vital signs are stable.  On exam, he shows no signs of distress.  On my evaluation, he states that he did not ingest any containers or bags.  He states that the powder was in a bag and reported to his hand and ingested it.  We will plan for medical clearance labs, TTS consultation.  Covid is negative.  Salicyclate, ethanol, acetaminophen level negative.  UDS is positive for cocaine, meth, marijuana.  CMP shows normal BUN and creatinine.  CBC without any significant leukocytosis or anemia.  X-ray of chest and pelvis are unremarkable.  Patient states that he did not ingest any bag or container.  He states that he swallowed the powder.  This was 2 days ago.  At this time, patient is hemodynamically stable.  He is alert and oriented without any signs of distress.  Patient is medically cleared at this time.   Patient will be transferred back to behavioral health urgent care.  Portions of this note were generated with Scientist, clinical (histocompatibility and immunogenetics). Dictation errors may occur despite best attempts at proofreading.  Final Clinical Impression(s) / ED Diagnoses Final diagnoses:  Suicidal ideation  Polysubstance abuse [facility])    Rx / DC Orders ED Discharge Orders    None       Rosana Hoes 05/07/20 0004    Linwood Dibbles, MD 05/08/20 [phone]

## 2020-05-06 NOTE — ED Notes (Signed)
Pt transported to [facility] due to pt stating, "I swallowed 2 bags of Meth before I came in here". Pt stable at time. Recommendation to send pt to [facility] for medical clearance. EMS transported pt to [facility]. Report given to charge nurse, Misty Stanley, at [facility].

## 2020-05-06 NOTE — Discharge Instructions (Addendum)
Patient will go to [facility]

## 2020-05-06 NOTE — ED Notes (Signed)
Called 911 for transport. Pt stated he ingested 2 bags of drugs. 1 was meth and the other was unknown. RN called report to [employer]. Belongings sent with pt
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
## 2020-05-06 NOTE — ED Provider Notes (Signed)
Behavioral Health Urgent Care Medical Screening Exam  Patient Name: Steven Montoya. MRN: [id number] Date of Evaluation: 05/06/20 Chief Complaint:   Diagnosis:  Final diagnoses:  None    History of Present illness: Steven Montoya. is a 44 y.o. male patient presented to [facility] via law enforcement with complaints of suicidal ideation; stating that he was getting ready to cut himself but the  cops showed up."  Patient stating that he was recently seen in the hospital but did not follow up with outpatient services once discharged.  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  I just said fuck it don't no body care anyway."   Informed patient that would have to send him to the emergency room since he has swallowed something and doesn't know what it is."    Spoke to Dr. Lynelle Doctor informed of assessment and patient statement of swallowing an unknown substance.    Psychiatric Specialty Exam  Presentation  General Appearance:Appropriate for Environment;Disheveled  Eye Contact:Fair  Speech:Clear and Coherent;Normal Rate  Speech Volume:Normal  Handedness:Right   Mood and Affect  Mood:Anxious;Labile  Affect:Non-Congruent;Labile (Patient laughing and talking when discussing being suicidal and then stating that he swallowed an unknown substance; with then act as if he is crying)   Thought Process  Thought Processes:Coherent;Goal Directed  Descriptions of Associations:Intact  Orientation:Full (Time, Place and Person)  Thought Content:WDL  Hallucinations:None  Ideas of Reference:None  Suicidal Thoughts:Yes, Active (Patient states that he attempted to kill  himself by swallowing 2 bags of an unknown substance "could me meth or cocaine; I don't know"; has been having thoughts and visions of doing something and getting the "cops to shoot me") With Intent;With Plan;With Access to Means  Homicidal Thoughts:No   Sensorium  Memory:Immediate Good;Recent Good;Remote Good  Judgment:Fair  Insight:Fair   Executive Functions  Concentration:Good  Attention Span:Good  Recall:Good  Fund of Knowledge:Good  Language:Good   Psychomotor Activity  Psychomotor Activity:Normal   Assets  Assets:Communication Skills;Desire for Improvement;Housing   Sleep  Sleep:Fair  Number of hours: No data recorded  Physical Exam: Physical Exam HENT:     Head: Normocephalic.  Pulmonary:     Effort: Pulmonary effort is normal.  Neurological:     Mental Status: He is alert.  Psychiatric:        Attention and Perception: Attention and perception normal.        Mood and Affect: Mood is anxious. Affect is labile.        Speech: Speech normal.        Behavior: Behavior normal. Behavior is cooperative.        Thought Content: Thought content includes suicidal ideation. Thought content includes suicidal plan.        Cognition and Memory: Cognition and memory normal.        Judgment: Judgment is impulsive.    Review of Systems  Constitutional:       Patient reporting that the attempted suicide by swallowing 2 bags of an unknown substance  Psychiatric/Behavioral: Positive for depression, substance abuse and suicidal ideas. Negative for hallucinations and memory loss. The patient  is nervous/anxious. The patient does not have insomnia.    Blood pressure (!) 117/92, pulse 94, temperature 97.8 F (36.6 C), temperature source Tympanic, SpO2 99 %. There is no height or weight on file to calculate BMI.  Musculoskeletal: Strength & Muscle Tone: within normal limits Gait & Station: normal Patient leans: N/A   [facility] Discharge Disposition for Follow up  and Recommendations: Based on my evaluation the patient appears to have an emergency medical condition for which I recommend the patient be transferred to the emergency department for further evaluation.    Spoke to Dr. Lynelle Doctor [facility] ED[facility]) informed of assessment and patient statement of swallowing an unknown substance.       Ernest Popowski, NP 05/06/2020, 6:20 PM

## 2020-05-06 NOTE — ED Notes (Signed)
Locker #[id number] 

## 2020-05-07 ENCOUNTER — Encounter (HOSPITAL_COMMUNITY): Payer: Self-pay

## 2020-05-07 ENCOUNTER — Other Ambulatory Visit: Payer: Self-pay

## 2020-05-07 MED ORDER — MAGNESIUM HYDROXIDE 400 MG/5ML PO SUSP: 30.0000 mL | Freq: Every day | ORAL | Status: DC | PRN

## 2020-05-07 MED ORDER — ALUM & MAG HYDROXIDE-SIMETH 200-200-20 MG/5ML PO SUSP: 30.0000 mL | ORAL | Status: DC | PRN

## 2020-05-07 MED ORDER — ACETAMINOPHEN 325 MG PO TABS: 650.0000 mg | ORAL_TABLET | Freq: Four times a day (QID) | ORAL | Status: DC | PRN

## 2020-05-07 MED ORDER — MIRTAZAPINE 15 MG PO TABS
15.0000 mg | ORAL_TABLET | Freq: Every day | ORAL | 0 refills | Status: DC
Start: 2020-05-07 — End: 2022-08-31

## 2020-05-07 MED ORDER — HYDROXYZINE HCL 25 MG PO TABS: 25.0000 mg | ORAL_TABLET | Freq: Three times a day (TID) | ORAL | 0 refills | Status: DC | PRN

## 2020-05-07 MED ORDER — HYDROXYZINE HCL 25 MG PO TABS
25.0000 mg | ORAL_TABLET | Freq: Three times a day (TID) | ORAL | Status: DC | PRN
  Administered 2020-05-07: 25 mg via ORAL
  Filled 2020-05-07: qty 1

## 2020-05-07 MED ORDER — MIRTAZAPINE 15 MG PO TABS: 15.0000 mg | ORAL_TABLET | Freq: Every day | ORAL | Status: DC

## 2020-05-07 NOTE — Progress Notes (Signed)
Patient will be transported to [facility] this afternoon.  Ladoris Gene MSW,LCSWA,LCASA Disposition, CSW [phone] (cell)

## 2020-05-07 NOTE — Progress Notes (Signed)
CSW received phone call from pt's mother, Chrystine Oiler [phone]). She shared that pt can live with her in [place], [place] and that she will help him get linked with MH/SU and Suboxone providers. She did request assistance with transportation in getting him to her residence in [place]. CSW will follow up with [facility] provider Reola Calkins, NP.   Wells Guiles, MSW, LCSW, LCAS Clinical Social Worker II Disposition CSW [phone]

## 2020-05-07 NOTE — ED Notes (Signed)
Pt going to cousin's house to stay until [facility]. Belongings returned to pt intacted. Bus ticket given to get to destination. Escorted to front lobby and outside for pt to catch bus. Ambulated per self. No issues noted.

## 2020-05-07 NOTE — ED Triage Notes (Signed)
Pt returned from [facility]. States he was misunderstood this morning @ [facility], that he had drugs he ingested a few days ago from bags, not that he swallowed bags of drugs today. States that he attempted to cut himself today but [facility] stopped him & brought him here. Pt A&O, calm & cooperative, flat with no eye contact.

## 2020-05-07 NOTE — ED Notes (Signed)
Pt resting with eyes closed. Rise and fall of chest noted. Will continue to monitor for safety 

## 2020-05-07 NOTE — ED Provider Notes (Signed)
FBC/OBS ASAP Discharge Summary  Date and Time: 05/07/2020 11:56 AM  Name: Steven Boreronnie R Gilkerson Jr.  MRN:  [id number]   Discharge Diagnoses:  Final diagnoses:  Severe recurrent major depression without psychotic features [facility](HCC)    Stay Summary: From admission H&P: Steven Montoyais a 43 y.o.malepatient who initially [name] via law enforcement with complaints of suicidal ideation; stating that he was getting ready to cut himself but thecops showed up." Patient was transferred to the ED for medical clearance due to reporting that he has swallowed two bags of an unknown drug." [facility]. [facility] and [facility]. [facility] was also [facility] on 05/01/2020 and was placed in continuous assessment and discharged on 05/02/2020. He states that he did not follow up with outpatient services once discharged. States he has been clean for 5 years "but my wife is on the stuff and that is my biggest damn problem; I got to get out of there and get myself together or I might as well dead." Patient then states that he swallowed two bags of unknown substances in an attempt to kill himself." When asked what substances was patient stated "I don't know could have been meth or cocaine. I just said fuck it don't no body care anyway."   Steven Montoya is a 44 year old male with history of cocaine, methamphetamine, THC, and alcohol use, known to our service line [facility] for SI. He never followed up with scheduled outpatient appointment and [facility] after separating from his wife; he discharged on 9/3/21with plans to stay in a motel room. [facility] with reports of SI secondary to substance use, inadequate housing, and not having transportation to fill his prescriptions. UDS positive for amphetamines,  cocaine, and THC. He states he is unable to return to his wife due to her drug use. He is requesting residential treatment. He is agreeable to [facility] use [facility]. He denies SI if he has a place to go for housing and treatment. Denies HI/AVH. He was restarted on Remeron and PRN Vistaril and provided with prescriptions at discharge. After assessment, patient changed his mind about [facility] and stated that he would discharge to stay with a cousin instead and wait for acceptance into [facility] residential rehab.  Total Time spent with patient: 20 minutes  Past Psychiatric History: History of polysubstance use disorder. Past Medical History:  Past Medical History:  Diagnosis Date  . ADHD   . Anxiety   . Chronic urethral stricture   . Depression   . Diverticulitis   . Frequency of urination   . Headache(784.0)   . Lower abdominal pain   . Mitral valve prolapse   . Nocturia   . PTSD (post-traumatic stress disorder)   . 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 Urgency of urination   . Urinary straining     Past Surgical History:  Procedure Laterality Date  . CARDIAC SURGERY    . COLONOSCOPY N/A 08/17/2013   Procedure: COLONOSCOPY;  Surgeon: Romie LeveeAlicia Thomas, MD;  Location: [facility] ENDOSCOPY;  Service: Endoscopy;  Laterality: N/A;  . CYSTO/ RETROGRADE  PYELOGRAM/ URETHRAL DILATION  11-05-2010  . MITRAL VALVE REPAIR Right 09/07/2017   Procedure: MINIMALLY INVASIVE MITRAL VALVE REPAIR (MVR) using Sorin Memo 3D Ring size 34;  Surgeon: Purcell Nails, MD;  Location: [facility] OR;  Service: Open Heart Surgery;  Laterality: Right;  . RIGHT/LEFT HEART CATH AND CORONARY ANGIOGRAPHY N/A 07/22/2017   Procedure: RIGHT/LEFT HEART CATH AND CORONARY  ANGIOGRAPHY;  Surgeon: Tonny Bollman, MD;  Location: [facility] INVASIVE CV LAB;  Service: Cardiovascular;  Laterality: N/A;  . TEE WITHOUT CARDIOVERSION N/A 06/28/2017   Procedure: TRANSESOPHAGEAL ECHOCARDIOGRAM (TEE);  Surgeon: Jake Bathe, MD;  Location: [facility] ENDOSCOPY;  Service: Cardiovascular;  Laterality: N/A;  . TEE WITHOUT CARDIOVERSION N/A 09/07/2017   Procedure: TRANSESOPHAGEAL ECHOCARDIOGRAM (TEE);  Surgeon: Purcell Nails, MD;  Location: [facility] OR;  Service: Open Heart Surgery;  Laterality: N/A;  . TOOTH EXTRACTION     canines   Family History:  Family History  Problem Relation Age of Onset  . Heart disease Father    Family Psychiatric History: Denies Social History:  Social History   Substance and Sexual Activity  Alcohol Use Yes   Comment: Using for 25 years     Social History   Substance and Sexual Activity  Drug Use Not Currently  . Types: Marijuana, "Crack" cocaine, Methamphetamines   Comment: pt is in jail    Social History   Socioeconomic History  . Marital status: Married    Spouse name: Not on file  . Number of children: Not on file  . Years of education: Not on file  . Highest education level: Not on file  Occupational History  . Not on file  Tobacco Use  . Smoking status: Current Some Day Smoker  . Smokeless tobacco: Never Used  Substance and Sexual Activity  . Alcohol use: Yes    Comment: Using for 25 years  . Drug use: Not Currently    Types: Marijuana, "Crack" cocaine, Methamphetamines    Comment: pt is in jail  . Sexual activity: Not on file  Other Topics Concern  . Not on file  Social History Narrative   ** Merged History Encounter **       Social Determinants of Health   Financial Resource Strain:   . Difficulty of Paying Living Expenses: Not on file  Food Insecurity:   . Worried About Programme researcher, broadcasting/film/video in the Last Year: Not on file  . Ran Out of Food in the Last Year: Not on file  Transportation Needs:   . Lack of Transportation  (Medical): Not on file  . Lack of Transportation (Non-Medical): Not on file  Physical Activity:   . Days of Exercise per Week: Not on file  . Minutes of Exercise per Session: Not on file  Stress:   . Feeling of Stress : Not on file  Social Connections:   . Frequency of Communication with Friends and Family: Not on file  . Frequency of Social Gatherings with Friends and Family: Not on file  . Attends Religious Services: Not on file  . Active Member of Clubs or Organizations: Not on file  . Attends Banker Meetings: Not on file  . Marital Status: Not on file   SDOH:  SDOH Screenings   Alcohol Screen: Low Risk   . Last Alcohol Screening Score (AUDIT): 0  Depression (PHQ2-9):   . PHQ-2 Score: Not on file  Financial Resource Strain:   . Difficulty of Paying Living Expenses: Not on file  Food Insecurity:   .  Worried About Programme researcher, broadcasting/film/video in the Last Year: Not on file  . Ran Out of Food in the Last Year: Not on file  Housing:   . Last Housing Risk Score: Not on file  Physical Activity:   . Days of Exercise per Week: Not on file  . Minutes of Exercise per Session: Not on file  Social Connections:   . Frequency of Communication with Friends and Family: Not on file  . Frequency of Social Gatherings with Friends and Family: Not on file  . Attends Religious Services: Not on file  . Active Member of Clubs or Organizations: Not on file  . Attends Banker Meetings: Not on file  . Marital Status: Not on file  Stress:   . Feeling of Stress : Not on file  Tobacco Use: High Risk  . Smoking Tobacco Use: Current Some Day Smoker  . Smokeless Tobacco Use: Never Used  Transportation Needs:   . Freight forwarder (Medical): Not on file  . Lack of Transportation (Non-Medical): Not on file    Has this patient used any form of tobacco in the last 30 days? (Cigarettes, Smokeless Tobacco, Cigars, and/or Pipes) No  Current Medications:  Current  Facility-Administered Medications  Medication Dose Route Frequency Provider Last Rate Last Admin  . acetaminophen (TYLENOL) tablet 650 mg  650 mg Oral Q6H PRN Jackelyn Poling, NP      . alum & mag hydroxide-simeth (MAALOX/MYLANTA) 200-200-20 MG/5ML suspension 30 mL  30 mL Oral Q4H PRN Nira Conn A, NP      . hydrOXYzine (ATARAX/VISTARIL) tablet 25 mg  25 mg Oral TID PRN Nira Conn A, NP      . magnesium hydroxide (MILK OF MAGNESIA) suspension 30 mL  30 mL Oral Daily PRN Nira Conn A, NP      . mirtazapine (REMERON) tablet 15 mg  15 mg Oral QHS Jackelyn Poling, NP       Current Outpatient Medications  Medication Sig Dispense Refill  . mirtazapine (REMERON) 15 MG tablet Take 15 mg by mouth at bedtime.    . SUBOXONE 8-2 MG FILM Place 1 strip under the tongue 3 (three) times daily. (Patient not taking: Reported on 05/06/2020)      PTA Medications: (Not in a hospital admission)   Musculoskeletal  Strength & Muscle Tone: within normal limits Gait & Station: normal Patient leans: N/A  Psychiatric Specialty Exam  Presentation  General Appearance: Fairly Groomed  Eye Contact:Fair  Speech:Clear and Coherent;Normal Rate  Speech Volume:Normal  Handedness:Right   Mood and Affect  Mood:Irritable  Affect:Congruent   Thought Process  Thought Processes:Coherent  Descriptions of Associations:Intact  Orientation:Full (Time, Place and Person)  Thought Content:Logical  Hallucinations:Hallucinations: None  Ideas of Reference:None  Suicidal Thoughts:Suicidal Thoughts: No SI Active Intent and/or Plan: With Intent;With Plan;With Access to Means  Homicidal Thoughts:Homicidal Thoughts: No   Sensorium  Memory:Immediate Good;Recent Good;Remote Good  Judgment:Fair  Insight:Fair   Executive Functions  Concentration:Fair  Attention Span:Fair  Recall:Fair  Fund of Knowledge:Fair  Language:Fair   Psychomotor Activity  Psychomotor Activity:Psychomotor Activity:  Normal   Assets  Assets:Communication Skills;Desire for Improvement;Leisure Time   Sleep  Sleep:Sleep: Fair   Physical Exam  Physical Exam Vitals and nursing note reviewed.  Constitutional:      Appearance: He is well-developed.  Cardiovascular:     Rate and Rhythm: Normal rate.  Pulmonary:     Effort: Pulmonary effort is normal.  Neurological:     Mental  Status: He is alert and oriented to person, place, and time.    Review of Systems  Constitutional: Negative.   Respiratory: Negative for cough and shortness of breath.   Cardiovascular: Negative for chest pain.  Psychiatric/Behavioral: Positive for depression and substance abuse. Negative for hallucinations and suicidal ideas. The patient is not nervous/anxious and does not have insomnia.    Blood pressure 108/78, pulse 76, temperature 98.4 F (36.9 C), temperature source Oral, resp. rate 18, SpO2 98 %. There is no height or weight on file to calculate BMI.  Demographic Factors:  Male, Caucasian and Low socioeconomic status  Loss Factors: Loss of significant relationship and Financial problems/change in socioeconomic status  Historical Factors: NA  Risk Reduction Factors:   Living with another person, especially a relative  Continued Clinical Symptoms:  Alcohol/Substance Abuse/Dependencies  Cognitive Features That Contribute To Risk:  None    Suicide Risk:  Mild:  Suicidal ideation of limited frequency, intensity, duration, and specificity.  There are no identifiable plans, no associated intent, mild dysphoria and related symptoms, good self-control (both objective and subjective assessment), few other risk factors, and identifiable protective factors, including available and accessible social support.  Plan Of Care/Follow-up recommendations: Activity as tolerated. Diet as recommended by primary care physician. Keep all scheduled follow-up appointments as recommended.  Disposition: Patient shows no evidence of  acute risk of harm to self or others and is psych cleared for discharge to rehab.  Aldean Baker, NP 05/07/2020, 11:56 AM

## 2020-05-07 NOTE — ED Provider Notes (Signed)
Behavioral Health Admission H&P [facility] & OBS)  Date: 05/07/20 Patient Name: Steven Montoya. MRN: [id number] Chief Complaint:  Chief Complaint  Patient presents with   Suicidal      Diagnoses:  Final diagnoses:  Severe recurrent major depression without psychotic features (HCC)    HPI: History of Present illness: Steven Montoya. is a 44 y.o. male patient who initially presented to [facility] via law enforcement with complaints of suicidal ideation; stating that he was getting ready to cut himself but the  cops showed up." Patient was transferred to the ED for medical clearance due to reporting that he has swallowed two bags of an unknown drug." He was transferred back to [facility] for continuous assessment. Patient was evaluated at [facility] on 04/24/2020 and transferred to [facility] for inpatient treatment. He was also evaluated at [facility] on 05/01/2020 and was placed in continuous assessment and discharged on 05/02/2020. He states that he did not follow up with outpatient services once discharged.  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  I just said fuck it don't no body care anyway."   PHQ 2-9:     ED from 05/06/2020 in [facility] Most recent reading at 05/07/2020 12:09 AM ED from 05/06/2020 in [facility] Most recent reading at 05/06/2020  7:13 PM ED from 05/01/2020 in [facility] Most recent reading at 05/01/2020  6:35 AM  C-SSRS RISK CATEGORY High Risk Low Risk High Risk       Total Time spent with patient: 20 minutes  Musculoskeletal  Strength & Muscle Tone: within normal limits Gait &  Station: normal Patient leans: N/A  Psychiatric Specialty Exam  Presentation General Appearance: Appropriate for Environment;Casual;Fairly Groomed  Eye Contact:Fair  Speech:Clear and Coherent;Normal Rate  Speech Volume:Normal  Handedness:Right   Mood and Affect  Mood:Anxious;Depressed  Affect:Non-Congruent   Thought Process  Thought Processes:Coherent  Descriptions of Associations:Intact  Orientation:Full (Time, Place and Person)  Thought Content:WDL  Hallucinations:Hallucinations: None  Ideas of Reference:None  Suicidal Thoughts:Suicidal Thoughts: No (States that SI comes and goes) SI Active Intent and/or Plan: With Intent;With Plan;With Access to Means  Homicidal Thoughts:Homicidal Thoughts: No   Sensorium  Memory:Immediate Good;Recent Good;Remote Good  Judgment:Fair  Insight:Fair   Executive Functions  Concentration:Good  Attention Span:Good  Recall:Good  Fund of Knowledge:Good  Language:Good   Psychomotor Activity  Psychomotor Activity:Psychomotor Activity: Normal   Assets  Assets:Communication Skills;Desire for Improvement;Housing;Financial Resources/Insurance   Sleep  Sleep:Sleep: Fair   Physical Exam Constitutional:      General: He is not in acute distress.    Appearance: He is not ill-appearing, toxic-appearing or diaphoretic.  HENT:     Head: Normocephalic.     Right Ear: External ear normal.     Left Ear: External ear normal.  Eyes:     Pupils: Pupils are equal, round, and reactive to light.  Cardiovascular:     Rate and Rhythm: Normal rate.  Pulmonary:     Effort: Pulmonary effort is normal. No respiratory distress.  Musculoskeletal:  General: Normal range of motion.  Skin:    General: Skin is warm and dry.  Neurological:     Mental Status: He is alert and oriented to person, place, and time.  Psychiatric:        Mood and Affect: Mood is depressed.        Speech: Speech normal.        Behavior: Behavior  is cooperative.        Thought Content: Thought content is not paranoid or delusional. Thought content does not include homicidal or suicidal ideation. Thought content does not include suicidal plan.    Review of Systems  Constitutional: Negative for chills, diaphoresis, fever, malaise/fatigue and weight loss.  HENT: Negative for congestion.   Respiratory: Negative for cough and shortness of breath.   Cardiovascular: Negative for chest pain and palpitations.  Gastrointestinal: Negative for diarrhea, nausea and vomiting.  Neurological: Negative for dizziness and seizures.  Psychiatric/Behavioral: Positive for depression, substance abuse and suicidal ideas. Negative for hallucinations and memory loss. The patient is nervous/anxious and has insomnia.   All other systems reviewed and are negative.   Blood pressure 107/82, pulse 79, temperature 98.2 F (36.8 C), resp. rate 18, SpO2 97 %. There is no height or weight on file to calculate BMI.  Past Psychiatric History: MDD  Is the patient at risk to self? Yes  Has the patient been a risk to self in the past 6 months? Yes .    Has the patient been a risk to self within the distant past? No   Is the patient a risk to others? No   Has the patient been a risk to others in the past 6 months? No   Has the patient been a risk to others within the distant past? 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   Complex valvuloplasty including triangular resection of posterior leaflet, artificial Goretex neochords x6 and 34 mm Sorin Memo 3D ring annuloplasty via right mini thoracotomy approach   Urgency of urination    Urinary  straining     Past Surgical History:  Procedure Laterality Date   CARDIAC SURGERY     COLONOSCOPY N/A 08/17/2013   Procedure: COLONOSCOPY;  Surgeon: Romie Levee, MD;  Location: [facility] ENDOSCOPY;  Service: Endoscopy;  Laterality: N/A;   CYSTO/ RETROGRADE PYELOGRAM/ URETHRAL DILATION  11-05-2010   MITRAL VALVE REPAIR Right 09/07/2017   Procedure: MINIMALLY INVASIVE MITRAL VALVE REPAIR (MVR) using Sorin Memo 3D Ring size 34;  Surgeon: Purcell Nails, MD;  Location: [facility] OR;  Service: Open Heart Surgery;  Laterality: Right;   RIGHT/LEFT HEART CATH AND CORONARY ANGIOGRAPHY N/A 07/22/2017   Procedure: RIGHT/LEFT HEART CATH AND CORONARY ANGIOGRAPHY;  Surgeon: Tonny Bollman, MD;  Location: [facility] INVASIVE CV LAB;  Service: Cardiovascular;  Laterality: N/A;   TEE WITHOUT CARDIOVERSION N/A 06/28/2017   Procedure: TRANSESOPHAGEAL ECHOCARDIOGRAM (TEE);  Surgeon: Jake Bathe, MD;  Location: [facility] ENDOSCOPY;  Service: Cardiovascular;  Laterality: N/A;   TEE WITHOUT CARDIOVERSION N/A 09/07/2017   Procedure: TRANSESOPHAGEAL ECHOCARDIOGRAM (TEE);  Surgeon: Purcell Nails, MD;  Location: [facility] OR;  Service: Open Heart Surgery;  Laterality: N/A;   TOOTH EXTRACTION     canines    Family History:  Family History  Problem Relation Age of Onset   Heart disease Father     Social History:  Social History   Socioeconomic History   Marital status: Married    Spouse name: Not on file   Number of children: Not on file   Years of education: Not on file   Highest education level: Not on file  Occupational History   Not on file  Tobacco Use   Smoking status: Current Some Day Smoker   Smokeless tobacco: Never Used  Substance and Sexual Activity   Alcohol use: Yes    Comment: Using for 25 years   Drug use: Not Currently    Types: Marijuana, "Crack" cocaine, Methamphetamines    Comment: pt is in jail   Sexual activity: Not on file  Other Topics Concern   Not on file  Social History Narrative    ** Merged History Encounter **       Social Determinants of Health   Financial Resource Strain:    Difficulty of Paying Living Expenses: Not on file  Food Insecurity:    Worried About Programme researcher, broadcasting/film/video in the Last Year: Not on file   [employer] of Food in the Last Year: Not on file  Transportation Needs:    Lack of Transportation (Medical): Not on file   Lack of Transportation (Non-Medical): Not on file  Physical Activity:    Days of Exercise per Week: Not on file   Minutes of Exercise per Session: Not on file  Stress:    Feeling of Stress : Not on file  Social Connections:    Frequency of Communication with Friends and Family: Not on file   Frequency of Social Gatherings with Friends and Family: Not on file   Attends Religious Services: Not on file   Active Member of Clubs or Organizations: Not on file   Attends Banker Meetings: Not on file   Marital Status: Not on file  Intimate Partner Violence:    Fear of Current or Ex-Partner: Not on file   Emotionally Abused: Not on file   Physically Abused: Not on file   Sexually Abused: Not on file    SDOH:  SDOH Screenings   Alcohol Screen: Low Risk    Last Alcohol Screening Score (AUDIT): 0  Depression (PHQ2-9):    PHQ-2 Score: Not on file  Financial Resource Strain:    Difficulty of Paying Living Expenses: Not on file  Food Insecurity:    Worried About Programme researcher, broadcasting/film/video in the Last Year: Not on file   [employer] of Food in the Last Year: Not on file  Housing:    Last Housing Risk Score: Not on file  Physical Activity:    Days of Exercise per Week: Not on file   Minutes of Exercise per Session: Not on file  Social Connections:    Frequency of Communication with Friends and Family: Not on file   Frequency of Social Gatherings with Friends and Family: Not on file   Attends Religious Services: Not on file   Active Member of Clubs or Organizations: Not on file   Attends Tax inspector Meetings: Not on file   Marital Status: Not on file  Stress:    Feeling of Stress : Not on file  Tobacco Use: High Risk   Smoking Tobacco Use: Current  Some Day Smoker   Smokeless Tobacco Use: Never Used  Transportation Needs:    Freight forwarder (Medical): Not on file   Lack of Transportation (Non-Medical): Not on file    Last Labs:  Admission on 05/06/2020, Discharged on 05/06/2020  Component Date Value Ref Range Status   Sodium 05/06/2020 142  135 - 145 mmol/L Final   Potassium 05/06/2020 4.4  3.5 - 5.1 mmol/L Final   Chloride 05/06/2020 106  98 - 111 mmol/L Final   CO2 05/06/2020 27  22 - 32 mmol/L Final   Glucose, Bld 05/06/2020 79  70 - 99 mg/dL Final   Glucose reference range applies only to samples taken after fasting for at least 8 hours.   BUN 05/06/2020 16  6 - 20 mg/dL Final   Creatinine, Ser 05/06/2020 1.04  0.61 - 1.24 mg/dL Final   Calcium 46/96/2952 9.3  8.9 - 10.3 mg/dL Final   Total Protein 84/13/2440 6.1* 6.5 - 8.1 g/dL Final   Albumin 06/26/2535 3.7  3.5 - 5.0 g/dL Final   AST 64/40/3474 30  15 - 41 U/L Final   ALT 05/06/2020 20  0 - 44 U/L Final   Alkaline Phosphatase 05/06/2020 46  38 - 126 U/L Final   Total Bilirubin 05/06/2020 0.7  0.3 - 1.2 mg/dL Final   GFR calc non Af Amer 05/06/2020 >60  >60 mL/min Final   GFR calc Af Amer 05/06/2020 >60  >60 mL/min Final   Anion gap 05/06/2020 9  5 - 15 Final   Performed at [facility], [address], [place], [place] [address]   Alcohol, Ethyl (B) 05/06/2020 <10  <10 mg/dL Final   Comment: (NOTE) Lowest detectable limit for serum alcohol is 10 mg/dL.  For medical purposes only. Performed at [facility], [address] [place], [place] [address]    Acetaminophen (Tylenol), Serum 05/06/2020 <10* 10 - 30 ug/mL Final   Comment: (NOTE) Therapeutic concentrations vary significantly. A range of 10-30 ug/mL  may be an effective  concentration for many patients. However, some  are best treated at concentrations outside of this range. Acetaminophen concentrations >150 ug/mL at 4 hours after ingestion  and >50 ug/mL at 12 hours after ingestion are often associated with  toxic reactions.  Performed at [facility], [address], [place], [place] [address]    Salicylate Lvl 05/06/2020 <7.0* 7.0 - 30.0 mg/dL Final   Performed at [facility], [address], [place], [place] [address]   WBC 05/06/2020 7.6  4.0 - 10.5 K/uL Final   RBC 05/06/2020 4.90  4.22 - 5.81 MIL/uL Final   Hemoglobin 05/06/2020 15.2  13.0 - 17.0 g/dL Final   HCT 16/60/6301 44.5  39 - 52 % Final   MCV 05/06/2020 90.8  80.0 - 100.0 fL Final   MCH 05/06/2020 31.0  26.0 - 34.0 pg Final   MCHC 05/06/2020 34.2  30.0 - 36.0 g/dL Final   RDW 60/05/9322 13.8  11.5 - 15.5 % Final   Platelets 05/06/2020 336  150 - 400 K/uL Final   nRBC 05/06/2020 0.0  0.0 - 0.2 % Final   Neutrophils Relative % 05/06/2020 56  % Final   Neutro Abs 05/06/2020 4.2  1.7 - 7.7 K/uL Final   Lymphocytes Relative 05/06/2020 31  % Final   Lymphs Abs 05/06/2020 2.4  0.7 - 4.0 K/uL Final   Monocytes Relative 05/06/2020 9  % Final   Monocytes Absolute 05/06/2020  0.7  0 - 1 K/uL Final   Eosinophils Relative 05/06/2020 3  % Final   Eosinophils Absolute 05/06/2020 0.2  0 - 0 K/uL Final   Basophils Relative 05/06/2020 1  % Final   Basophils Absolute 05/06/2020 0.1  0 - 0 K/uL Final   Immature Granulocytes 05/06/2020 0  % Final   Abs Immature Granulocytes 05/06/2020 0.01  0.00 - 0.07 K/uL Final   Performed at [facility], [address] [place], [place] [address]   Opiates 05/06/2020 NONE DETECTED  NONE DETECTED Final   Cocaine 05/06/2020 POSITIVE* NONE DETECTED Final   Benzodiazepines 05/06/2020 NONE DETECTED  NONE DETECTED Final   Amphetamines 05/06/2020 POSITIVE* NONE DETECTED Final    Tetrahydrocannabinol 05/06/2020 POSITIVE* NONE DETECTED Final   Barbiturates 05/06/2020 NONE DETECTED  NONE DETECTED Final   Comment: (NOTE) DRUG SCREEN FOR MEDICAL PURPOSES ONLY.  IF CONFIRMATION IS NEEDED FOR ANY PURPOSE, NOTIFY LAB WITHIN 5 DAYS.  LOWEST DETECTABLE LIMITS FOR URINE DRUG SCREEN Drug Class                     Cutoff (ng/mL) Amphetamine and metabolites    1000 Barbiturate and metabolites    200 Benzodiazepine                 200 Tricyclics and metabolites     300 Opiates and metabolites        300 Cocaine and metabolites        300 THC                            50 Performed at [facility], [address], [place], [place] [address]    SARS Coronavirus 2 05/06/2020 NEGATIVE  NEGATIVE Final   Comment: (NOTE) SARS-CoV-2 target nucleic acids are NOT DETECTED.  The SARS-CoV-2 RNA is generally detectable in upper and lower respiratory specimens during the acute phase of infection. The lowest concentration of SARS-CoV-2 viral copies this assay can detect is 250 copies / mL. A negative result does not preclude SARS-CoV-2 infection and should not be used as the sole basis for treatment or other patient management decisions.  A negative result may occur with improper specimen collection / handling, submission of specimen other than nasopharyngeal swab, presence of viral mutation(s) within the areas targeted by this assay, and inadequate number of viral copies (<250 copies / mL). A negative result must be combined with clinical observations, patient history, and epidemiological information.  Fact Sheet for Patients:   BoilerBrush.com.cy  Fact Sheet for Healthcare Providers: https://pope.com/  This test is not yet approved or                           cleared by the [place] FDA and has been authorized for detection and/or diagnosis of SARS-CoV-2 by FDA under an Emergency Use Authorization  (EUA).  This EUA will remain in effect (meaning this test can be used) for the duration of the COVID-19 declaration under Section 564(b)(1) of the Act, 21 U.S.C. section 360bbb-3(b)(1), unless the authorization is terminated or revoked sooner.  Performed at [facility], [address] [place], [place] [address]    Glucose-Capillary 05/06/2020 90  70 - 99 mg/dL Final   Glucose reference range applies only to samples taken after fasting for at least 8 hours.  Admission on 05/01/2020, Discharged on 05/02/2020  Component Date  Value Ref Range Status   WBC 05/01/2020 13.6* 4.0 - 10.5 K/uL Final   RBC 05/01/2020 4.70  4.22 - 5.81 MIL/uL Final   Hemoglobin 05/01/2020 14.3  13.0 - 17.0 g/dL Final   HCT 16/10/960409/10/2019 42.7  39 - 52 % Final   MCV 05/01/2020 90.9  80.0 - 100.0 fL Final   MCH 05/01/2020 30.4  26.0 - 34.0 pg Final   MCHC 05/01/2020 33.5  30.0 - 36.0 g/dL Final   RDW 54/09/811909/10/2019 14.0  11.5 - 15.5 % Final   Platelets 05/01/2020 322  150 - 400 K/uL Final   nRBC 05/01/2020 0.0  0.0 - 0.2 % Final   Neutrophils Relative % 05/01/2020 67  % Final   Neutro Abs 05/01/2020 9.1* 1.7 - 7.7 K/uL Final   Lymphocytes Relative 05/01/2020 21  % Final   Lymphs Abs 05/01/2020 2.9  0.7 - 4.0 K/uL Final   Monocytes Relative 05/01/2020 10  % Final   Monocytes Absolute 05/01/2020 1.4* 0 - 1 K/uL Final   Eosinophils Relative 05/01/2020 1  % Final   Eosinophils Absolute 05/01/2020 0.1  0 - 0 K/uL Final   Basophils Relative 05/01/2020 1  % Final   Basophils Absolute 05/01/2020 0.1  0 - 0 K/uL Final   Immature Granulocytes 05/01/2020 0  % Final   Abs Immature Granulocytes 05/01/2020 0.04  0.00 - 0.07 K/uL Final   Performed at [facility], [address] [place], [place] [address]   Sodium 05/01/2020 136  135 - 145 mmol/L Final   Potassium 05/01/2020 4.2  3.5 - 5.1 mmol/L Final   Chloride 05/01/2020 101  98 - 111 mmol/L Final   CO2 05/01/2020 24  22 -  32 mmol/L Final   Glucose, Bld 05/01/2020 142* 70 - 99 mg/dL Final   Glucose reference range applies only to samples taken after fasting for at least 8 hours.   BUN 05/01/2020 26* 6 - 20 mg/dL Final   Creatinine, Ser 05/01/2020 1.22  0.61 - 1.24 mg/dL Final   Calcium 95/62/130809/10/2019 9.4  8.9 - 10.3 mg/dL Final   Total Protein 65/78/469609/10/2019 5.3* 6.5 - 8.1 g/dL Final   Albumin 29/52/841309/10/2019 3.5  3.5 - 5.0 g/dL Final   AST 24/40/102709/10/2019 29  15 - 41 U/L Final   ALT 05/01/2020 16  0 - 44 U/L Final   Alkaline Phosphatase 05/01/2020 49  38 - 126 U/L Final   Total Bilirubin 05/01/2020 0.8  0.3 - 1.2 mg/dL Final   GFR calc non Af Amer 05/01/2020 >60  >60 mL/min Final   GFR calc Af Amer 05/01/2020 >60  >60 mL/min Final   Anion gap 05/01/2020 11  5 - 15 Final   Performed at [facility], [address] [place], [place] [address]   POC Amphetamine UR 05/01/2020 None Detected  None Detected Final   POC Secobarbital (BAR) 05/01/2020 None Detected  None Detected Final   POC Buprenorphine (BUP) 05/01/2020 Positive* None Detected Final   POC Oxazepam (BZO) 05/01/2020 None Detected  None Detected Final   POC Cocaine UR 05/01/2020 None Detected  None Detected Final   POC Methamphetamine UR 05/01/2020 None Detected  None Detected Final   POC Morphine 05/01/2020 None Detected  None Detected Final   POC Oxycodone UR 05/01/2020 None Detected  None Detected Final   POC Methadone UR 05/01/2020 None Detected  None Detected Final   POC Marijuana UR 05/01/2020 Positive* None Detected Final  Admission on 04/24/2020, Discharged on  04/25/2020  Component Date Value Ref Range Status   SARS Coronavirus 2 04/24/2020 NEGATIVE  NEGATIVE Final   Comment: (NOTE) SARS-CoV-2 target nucleic acids are NOT DETECTED.  The SARS-CoV-2 RNA is generally detectable in upper and lower respiratory specimens during the acute phase of infection. The lowest concentration of SARS-CoV-2 viral copies this assay can detect  is 250 copies / mL. A negative result does not preclude SARS-CoV-2 infection and should not be used as the sole basis for treatment or other patient management decisions.  A negative result may occur with improper specimen collection / handling, submission of specimen other than nasopharyngeal swab, presence of viral mutation(s) within the areas targeted by this assay, and inadequate number of viral copies (<250 copies / mL). A negative result must be combined with clinical observations, patient history, and epidemiological information.  Fact Sheet for Patients:   BoilerBrush.com.cy  Fact Sheet for Healthcare Providers: https://pope.com/  This test is not yet approved or                           cleared by the [place] FDA and has been authorized for detection and/or diagnosis of SARS-CoV-2 by FDA under an Emergency Use Authorization (EUA).  This EUA will remain in effect (meaning this test can be used) for the duration of the COVID-19 declaration under Section 564(b)(1) of the Act, 21 U.S.C. section 360bbb-3(b)(1), unless the authorization is terminated or revoked sooner.  Performed at [facility], [address] [place], [place] [address]    SARS Coronavirus 2 Ag 04/24/2020 Negative  Negative Final   WBC 04/24/2020 12.9* 4.0 - 10.5 K/uL Final   RBC 04/24/2020 5.45  4.22 - 5.81 MIL/uL Final   Hemoglobin 04/24/2020 16.4  13.0 - 17.0 g/dL Final   HCT 60/45/4098 49.3  39 - 52 % Final   MCV 04/24/2020 90.5  80.0 - 100.0 fL Final   MCH 04/24/2020 30.1  26.0 - 34.0 pg Final   MCHC 04/24/2020 33.3  30.0 - 36.0 g/dL Final   RDW 11/91/4782 14.7  11.5 - 15.5 % Final   Platelets 04/24/2020 364  150 - 400 K/uL Final   nRBC 04/24/2020 0.0  0.0 - 0.2 % Final   Neutrophils Relative % 04/24/2020 79  % Final   Neutro Abs 04/24/2020 10.2* 1.7 - 7.7 K/uL Final   Lymphocytes Relative 04/24/2020 15  % Final   Lymphs Abs  04/24/2020 2.0  0.7 - 4.0 K/uL Final   Monocytes Relative 04/24/2020 5  % Final   Monocytes Absolute 04/24/2020 0.6  0 - 1 K/uL Final   Eosinophils Relative 04/24/2020 0  % Final   Eosinophils Absolute 04/24/2020 0.0  0 - 0 K/uL Final   Basophils Relative 04/24/2020 1  % Final   Basophils Absolute 04/24/2020 0.1  0 - 0 K/uL Final   Immature Granulocytes 04/24/2020 0  % Final   Abs Immature Granulocytes 04/24/2020 0.04  0.00 - 0.07 K/uL Final   Performed at [facility], [address], [place], [place] [address]   Sodium 04/24/2020 136  135 - 145 mmol/L Final   Potassium 04/24/2020 3.8  3.5 - 5.1 mmol/L Final   Chloride 04/24/2020 100  98 - 111 mmol/L Final   CO2 04/24/2020 24  22 - 32 mmol/L Final   Glucose, Bld 04/24/2020 93  70 - 99 mg/dL Final   Glucose reference range applies only to samples taken after fasting  for at least 8 hours.   BUN 04/24/2020 15  6 - 20 mg/dL Final   Creatinine, Ser 04/24/2020 1.15  0.61 - 1.24 mg/dL Final   Calcium 96/29/5284 9.5  8.9 - 10.3 mg/dL Final   Total Protein 13/24/4010 6.2* 6.5 - 8.1 g/dL Final   Albumin 27/25/3664 3.9  3.5 - 5.0 g/dL Final   AST 40/34/7425 23  15 - 41 U/L Final   ALT 04/24/2020 14  0 - 44 U/L Final   Alkaline Phosphatase 04/24/2020 42  38 - 126 U/L Final   Total Bilirubin 04/24/2020 0.9  0.3 - 1.2 mg/dL Final   GFR calc non Af Amer 04/24/2020 >60  >60 mL/min Final   GFR calc Af Amer 04/24/2020 >60  >60 mL/min Final   Anion gap 04/24/2020 12  5 - 15 Final   Performed at [facility], [address], [place], [place] [address]   TSH 04/24/2020 2.481  0.350 - 4.500 uIU/mL Final   Comment: Performed by a 3rd Generation assay with a functional sensitivity of <=0.01 uIU/mL. Performed at [facility], [address], [place], [place] [address]    POC Amphetamine UR 04/24/2020 None Detected  None Detected Final   POC Secobarbital (BAR) 04/24/2020 None Detected  None Detected Final    POC Buprenorphine (BUP) 04/24/2020 Positive* None Detected Final   POC Oxazepam (BZO) 04/24/2020 None Detected  None Detected Final   POC Cocaine UR 04/24/2020 None Detected  None Detected Final   POC Methamphetamine UR 04/24/2020 None Detected  None Detected Final   POC Morphine 04/24/2020 None Detected  None Detected Final   POC Oxycodone UR 04/24/2020 None Detected  None Detected Final   POC Methadone UR 04/24/2020 None Detected  None Detected Final   POC Marijuana UR 04/24/2020 Positive* None Detected Final   SARS Coronavirus 2 Ag 04/24/2020 NEGATIVE  NEGATIVE Final   Comment: (NOTE) SARS-CoV-2 antigen NOT DETECTED.   Negative results are presumptive.  Negative results do not preclude SARS-CoV-2 infection and should not be used as the sole basis for treatment or other patient management decisions, including infection  control decisions, particularly in the presence of clinical signs and  symptoms consistent with COVID-19, or in those who have been in contact with the virus.  Negative results must be combined with clinical observations, patient history, and epidemiological information. The expected result is Negative.  Fact Sheet for Patients: https://sanders-williams.net/  Fact Sheet for Healthcare Providers: https://martinez.com/   This test is not yet approved or cleared by the [place] FDA and  has been authorized for detection and/or diagnosis of SARS-CoV-2 by FDA under an Emergency Use Authorization (EUA).  This EUA will remain in effect (meaning this test can be used) for the duration of  the C                          OVID-19 declaration under Section 564(b)(1) of the Act, 21 U.S.C. section 360bbb-3(b)(1), unless the authorization is terminated or revoked sooner.    Admission on 01/03/2020, Discharged on 01/03/2020  Component Date Value Ref Range Status   Sodium 01/03/2020 140  135 - 145 mmol/L Final   Potassium  01/03/2020 3.8  3.5 - 5.1 mmol/L Final   Chloride 01/03/2020 105  98 - 111 mmol/L Final   CO2 01/03/2020 23  22 - 32 mmol/L Final   Glucose, Bld 01/03/2020 82  70 - 99 mg/dL Final   Glucose  reference range applies only to samples taken after fasting for at least 8 hours.   BUN 01/03/2020 12  6 - 20 mg/dL Final   Creatinine, Ser 01/03/2020 1.06  0.61 - 1.24 mg/dL Final   Calcium 36/46/8032 9.4  8.9 - 10.3 mg/dL Final   Total Protein 08/22/8249 6.3* 6.5 - 8.1 g/dL Final   Albumin 03/70/4888 3.7  3.5 - 5.0 g/dL Final   AST 91/69/4503 81* 15 - 41 U/L Final   ALT 01/03/2020 59* 0 - 44 U/L Final   Alkaline Phosphatase 01/03/2020 54  38 - 126 U/L Final   Total Bilirubin 01/03/2020 0.9  0.3 - 1.2 mg/dL Final   GFR calc non Af Amer 01/03/2020 >60  >60 mL/min Final   GFR calc Af Amer 01/03/2020 >60  >60 mL/min Final   Anion gap 01/03/2020 12  5 - 15 Final   Performed at [facility], [address], [place], [place] [address]   Sodium 01/03/2020 140  135 - 145 mmol/L Final   Potassium 01/03/2020 3.6  3.5 - 5.1 mmol/L Final   Chloride 01/03/2020 105  98 - 111 mmol/L Final   BUN 01/03/2020 12  6 - 20 mg/dL Final   Creatinine, Ser 01/03/2020 1.00  0.61 - 1.24 mg/dL Final   Glucose, Bld 00/34/9179 75  70 - 99 mg/dL Final   Glucose reference range applies only to samples taken after fasting for at least 8 hours.   Calcium, Ion 01/03/2020 1.14* 1.15 - 1.40 mmol/L Final   TCO2 01/03/2020 25  22 - 32 mmol/L Final   Hemoglobin 01/03/2020 14.6  13.0 - 17.0 g/dL Final   HCT 15/12/6977 43.0  39 - 52 % Final   WBC 01/03/2020 10.7* 4.0 - 10.5 K/uL Final   RBC 01/03/2020 4.60  4.22 - 5.81 MIL/uL Final   Hemoglobin 01/03/2020 14.1  13.0 - 17.0 g/dL Final   HCT 48/08/6551 40.8  39 - 52 % Final   MCV 01/03/2020 88.7  80.0 - 100.0 fL Final   MCH 01/03/2020 30.7  26.0 - 34.0 pg Final   MCHC 01/03/2020 34.6  30.0 - 36.0 g/dL Final   RDW 74/82/7078 14.1  11.5 - 15.5 % Final    Platelets 01/03/2020 315  150 - 400 K/uL Final   nRBC 01/03/2020 0.0  0.0 - 0.2 % Final   Performed at [facility], [address], [place], [place] [address]   Alcohol, Ethyl (B) 01/03/2020 <10  <10 mg/dL Final   Comment: (NOTE) Lowest detectable limit for serum alcohol is 10 mg/dL. For medical purposes only. Performed at [facility], [address] [place], [place] [address]    Lactic Acid, Venous 01/03/2020 0.8  0.5 - 1.9 mmol/L Final   Performed at [facility], [address] [place], [place] [address]   Prothrombin Time 01/03/2020 12.8  11.4 - 15.2 seconds Final   INR 01/03/2020 1.0  0.8 - 1.2 Final   Comment: (NOTE) INR goal varies based on device and disease states. Performed at [facility], [address] [place], [place] [address]    Blood Bank Specimen 01/03/2020 SAMPLE AVAILABLE FOR TESTING   Final   Sample Expiration 01/03/2020    Final                   Value:01/04/2020,2359 Performed at [facility], [address] [place], [place] [address]    CDS serology specimen 01/03/2020 SPECIMEN  WILL BE HELD FOR 14 DAYS IF TESTING IS REQUIRED   Final   Performed at [facility], [address], [place], [place] [address]    Allergies: Latex and Adhesive [tape]  PTA Medications: (Not in a hospital admission)   Medical Decision Making  Patient was medically cleared in the emergency department.  Resume home medications    Recommendations  Based on my evaluation the patient does not appear to have an emergency medical condition.   Patient will be placed in the continuous assessment area at [facility] for treatment and stabilization. He will be reevaluated on 05/07/2020. The treatment team will determine disposition at that time.      Jackelyn Poling, NP 05/07/20  1:14 AM

## 2020-05-07 NOTE — Progress Notes (Signed)
Pt has now refused treatment with the [facility]. Pt was given contact information on [facility] residential treatment program in [facility].    Wells Guiles, MSW, LCSW, LCAS Clinical Social Worker II Disposition CSW [phone]

## 2020-05-07 NOTE — ED Notes (Signed)
Pt asleep with even and unlabored respirations. No distress or discomfort noted. Pt remains safe on the unit. Will continue to monitor. 

## 2020-05-07 NOTE — ED Notes (Signed)
Called safe transport for transport to [facility]. Advised 3hr wait.

## 2020-05-07 NOTE — ED Notes (Signed)
Pt resting with eyes closed. Rise and fall of chest noted. Awake earlier and went to restroom. No new issues noted. Will continue to monitor for safety

## 2020-05-07 NOTE — ED Notes (Signed)
Pt on phone. Appearing depressed, emotional, and crying when talking to whomever on other end. Will continue to monitor for safety

## 2020-10-12 ENCOUNTER — Encounter (HOSPITAL_COMMUNITY): Payer: Self-pay | Admitting: Emergency Medicine

## 2020-10-12 ENCOUNTER — Emergency Department (HOSPITAL_COMMUNITY)
Admission: EM | Admit: 2020-10-12 | Discharge: 2020-10-12 | Disposition: A | Discharge status: Home / Self Care | Payer: Medicaid Other | Attending: Emergency Medicine | Admitting: Emergency Medicine

## 2020-10-12 ENCOUNTER — Other Ambulatory Visit: Payer: Self-pay

## 2020-10-12 DIAGNOSIS — W540XXA Bitten by dog, initial encounter: Secondary | ICD-10-CM | POA: Diagnosis not present

## 2020-10-12 DIAGNOSIS — R000 Tachycardia, unspecified: Secondary | ICD-10-CM | POA: Insufficient documentation

## 2020-10-12 DIAGNOSIS — S81852A Open bite, left lower leg, initial encounter: Secondary | ICD-10-CM | POA: Diagnosis present

## 2020-10-12 DIAGNOSIS — F17200 Nicotine dependence, unspecified, uncomplicated: Secondary | ICD-10-CM | POA: Insufficient documentation

## 2020-10-12 DIAGNOSIS — Z23 Encounter for immunization: Secondary | ICD-10-CM | POA: Diagnosis not present

## 2020-10-12 DIAGNOSIS — Z2914 Encounter for prophylactic rabies immune globin: Secondary | ICD-10-CM | POA: Insufficient documentation

## 2020-10-12 DIAGNOSIS — T148XXA Other injury of unspecified body region, initial encounter: Secondary | ICD-10-CM

## 2020-10-12 DIAGNOSIS — S80812A Abrasion, left lower leg, initial encounter: Secondary | ICD-10-CM | POA: Insufficient documentation

## 2020-10-12 MED ORDER — RABIES IMMUNE GLOBULIN 150 UNIT/ML IM INJ
20.0000 [IU]/kg | INJECTION | Freq: Once | INTRAMUSCULAR | Status: AC
  Administered 2020-10-12: 1500 [IU]
  Filled 2020-10-12: qty 10

## 2020-10-12 MED ORDER — TETANUS-DIPHTH-ACELL PERTUSSIS 5-2.5-18.5 LF-MCG/0.5 IM SUSY
0.5000 mL | PREFILLED_SYRINGE | Freq: Once | INTRAMUSCULAR | Status: AC
  Administered 2020-10-12: 0.5 mL via INTRAMUSCULAR
  Filled 2020-10-12: qty 0.5

## 2020-10-12 MED ORDER — RABIES VACCINE, PCEC IM SUSR
1.0000 mL | Freq: Once | INTRAMUSCULAR | Status: AC
  Administered 2020-10-12: 1 mL via INTRAMUSCULAR
  Filled 2020-10-12: qty 1
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
## 2020-10-12 NOTE — Discharge Instructions (Addendum)
You are at high risk for rabies exposure Please go to the urgent care center for completion of your rabies series                                  RABIES VACCINE FOLLOW UP  Patient's Name: Steven Montoya.                     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 Species of Animal:     You have been seen in the Emergency Department for a possible rabies exposure. It's very important you return for the additional vaccine doses.  Please call the clinic listed below for hours of operation.   Clinic that will administer your rabies vaccines:    DAY 0:  10/12/2020      DAY 3:  10/15/2020       DAY 7:  10/19/2020     DAY 14:  10/26/2020         The 5th vaccine injection is considered for immune compromised patients only.  DAY 28:  11/09/2020

## 2020-10-17 ENCOUNTER — Encounter (HOSPITAL_COMMUNITY): Payer: Self-pay

## 2020-10-17 ENCOUNTER — Other Ambulatory Visit: Payer: Self-pay

## 2020-10-17 ENCOUNTER — Ambulatory Visit (HOSPITAL_COMMUNITY)
Admission: EM | Admit: 2020-10-17 | Discharge: 2020-10-17 | Disposition: A | Discharge status: Home / Self Care | Payer: Medicaid Other | Attending: Internal Medicine | Admitting: Internal Medicine

## 2020-10-17 DIAGNOSIS — W5581XD Bitten by other mammals, subsequent encounter: Secondary | ICD-10-CM

## 2020-10-17 DIAGNOSIS — L03116 Cellulitis of left lower limb: Secondary | ICD-10-CM | POA: Diagnosis not present

## 2020-10-17 DIAGNOSIS — S81852D Open bite, left lower leg, subsequent encounter: Secondary | ICD-10-CM | POA: Diagnosis not present

## 2020-10-17 DIAGNOSIS — Z203 Contact with and (suspected) exposure to rabies: Secondary | ICD-10-CM | POA: Diagnosis not present

## 2020-10-17 MED ORDER — RABIES VACCINE, PCEC IM SUSR
1.0000 mL | Freq: Once | INTRAMUSCULAR | Status: AC
  Administered 2020-10-17: 1 mL via INTRAMUSCULAR

## 2020-10-17 MED ORDER — RABIES VACCINE, PCEC IM SUSR
INTRAMUSCULAR | Status: AC
  Filled 2020-10-17: qty 1

## 2020-10-17 MED ORDER — DOXYCYCLINE HYCLATE 100 MG PO CAPS: 100.0000 mg | ORAL_CAPSULE | Freq: Two times a day (BID) | ORAL | 0 refills | Status: DC

## 2020-10-17 NOTE — ED Triage Notes (Addendum)
Pt in for 2nd dose of rabies vaccine. Pt states he feels like the pain in his leg has gotten worse and he would like to have a provider evaluate him  Pt states he has tried taking ibuprofen with minimal relief
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
## 2020-11-10 ENCOUNTER — Other Ambulatory Visit: Payer: Self-pay

## 2020-11-10 ENCOUNTER — Emergency Department (HOSPITAL_COMMUNITY): Payer: Medicaid Other

## 2020-11-10 ENCOUNTER — Emergency Department (HOSPITAL_COMMUNITY)
Admission: EM | Admit: 2020-11-10 | Discharge: 2020-11-10 | Disposition: A | Discharge status: Home / Self Care | Payer: Medicaid Other | Attending: Emergency Medicine | Admitting: Emergency Medicine

## 2020-11-10 ENCOUNTER — Encounter (HOSPITAL_COMMUNITY): Payer: Self-pay | Admitting: Emergency Medicine

## 2020-11-10 DIAGNOSIS — Z91040 Latex allergy status: Secondary | ICD-10-CM | POA: Insufficient documentation

## 2020-11-10 DIAGNOSIS — R0789 Other chest pain: Secondary | ICD-10-CM | POA: Insufficient documentation

## 2020-11-10 DIAGNOSIS — F17200 Nicotine dependence, unspecified, uncomplicated: Secondary | ICD-10-CM | POA: Diagnosis not present

## 2020-11-10 DIAGNOSIS — Z955 Presence of coronary angioplasty implant and graft: Secondary | ICD-10-CM | POA: Insufficient documentation

## 2020-11-10 LAB — CBC
HCT: 44.2 % (ref 39.0–52.0)
Hemoglobin: 14.9 g/dL (ref 13.0–17.0)
MCH: 30.3 pg (ref 26.0–34.0)
MCHC: 33.7 g/dL (ref 30.0–36.0)
MCV: 89.8 fL (ref 80.0–100.0)
Platelets: 329 10*3/uL (ref 150–400)
RBC: 4.92 MIL/uL (ref 4.22–5.81)
RDW: 15 % (ref 11.5–15.5)
WBC: 7.7 10*3/uL (ref 4.0–10.5)
nRBC: 0 % (ref 0.0–0.2)

## 2020-11-10 LAB — URINALYSIS, ROUTINE W REFLEX MICROSCOPIC
Bilirubin Urine: NEGATIVE
Glucose, UA: NEGATIVE mg/dL
Hgb urine dipstick: NEGATIVE
Ketones, ur: NEGATIVE mg/dL
Leukocytes,Ua: NEGATIVE
Nitrite: NEGATIVE
Protein, ur: NEGATIVE mg/dL
Specific Gravity, Urine: 1.006 (ref 1.005–1.030)
pH: 6 (ref 5.0–8.0)

## 2020-11-10 LAB — RAPID URINE DRUG SCREEN, HOSP PERFORMED
Amphetamines: NOT DETECTED
Barbiturates: NOT DETECTED
Benzodiazepines: NOT DETECTED
Cocaine: POSITIVE — AB
Opiates: NOT DETECTED
Tetrahydrocannabinol: POSITIVE — AB

## 2020-11-10 LAB — BASIC METABOLIC PANEL
Anion gap: 7 (ref 5–15)
BUN: 12 mg/dL (ref 6–20)
CO2: 26 mmol/L (ref 22–32)
Calcium: 10.2 mg/dL (ref 8.9–10.3)
Chloride: 105 mmol/L (ref 98–111)
Creatinine, Ser: 0.87 mg/dL (ref 0.61–1.24)
GFR, Estimated: >60 mL/min (ref >60)
Glucose, Bld: 73 mg/dL (ref 70–99)
Potassium: 4.2 mmol/L (ref 3.5–5.1)
Sodium: 138 mmol/L (ref 135–145)

## 2020-11-10 LAB — TROPONIN I (HIGH SENSITIVITY): Troponin I (High Sensitivity): 5 ng/L (ref <18)

## 2020-11-10 NOTE — ED Triage Notes (Signed)
Patient BIB [facility] from home. Complaint of midsternal chest pain for 3 days, worse today. Patient also endorses regular syncopal episodes. A&Ox4 in triage.

## 2020-11-10 NOTE — ED Provider Notes (Signed)
[name] EMERGENCY DEPARTMENT Provider Note   CSN: [id number] Arrival date & time: 11/10/20  1628     History Chief Complaint  Patient presents with  . Chest Pain    Steven Montoya Steven Montoya. is a 45 y.o. male.  The history is provided by the patient and medical records.  Chest Pain  Steven Montoya Steven Montoya. is a 45 y.o. male who presents to the Emergency Department complaining of chest pain. He presents the emergency department complaining of three days of central chest pain. Pain is described as a squeezing sensation. It is nonradiating. It is worse with movement of his arms. He states that he sometimes feels the pain after he carries groceries for about a mile. He denies any shortness of breath, fevers, cough, leg swelling or pain. Occasionally the pain is worse when he takes a deep breath. He has a history of mitral valve repair several years ago. He is not currently on any medications. No history of blood clots. He states that he is in remission from drug use, five years clean    Past Medical History:  Diagnosis Date  . ADHD   . Anxiety   . Chronic urethral stricture   . Depression   . Diverticulitis   . Frequency of urination   . Headache(784.0)   . Lower abdominal pain   . Mitral valve prolapse   . Nocturia   . PTSD (post-traumatic stress disorder)   . 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 Urgency of urination   . Urinary straining     Patient Active Problem List   Diagnosis Date Noted  . Major depressive disorder, recurrent episode, severe (HCC) 04/26/2020  . Opiate dependence (HCC) 04/25/2020  . 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 MVP (mitral valve prolapse) 10/02/2016  . Non-rheumatic mitral regurgitation 10/02/2016  . Smoker  10/02/2016  . 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 Depressive disorder 12/31/2015  . Rectal bleeding 07/25/2013    Past Surgical History:  Procedure Laterality Date  . CARDIAC SURGERY    . COLONOSCOPY N/A 08/17/2013   Procedure: COLONOSCOPY;  Surgeon: Romie Levee, MD;  Location: [facility] ENDOSCOPY;  Service: Endoscopy;  Laterality: N/A;  . CYSTO/ RETROGRADE PYELOGRAM/ URETHRAL DILATION  11-05-2010  . MITRAL VALVE REPAIR Right 09/07/2017   Procedure: MINIMALLY INVASIVE MITRAL VALVE REPAIR (MVR) using Sorin Memo 3D Ring size 34;  Surgeon: Purcell Nails, MD;  Location: [facility] OR;  Service: Open Heart Surgery;  Laterality: Right;  . RIGHT/LEFT HEART CATH AND CORONARY ANGIOGRAPHY N/A 07/22/2017   Procedure: RIGHT/LEFT HEART CATH AND CORONARY ANGIOGRAPHY;  Surgeon: Tonny Bollman, MD;  Location: [facility] INVASIVE CV LAB;  Service: Cardiovascular;  Laterality: N/A;  . TEE WITHOUT CARDIOVERSION N/A 06/28/2017   Procedure: TRANSESOPHAGEAL ECHOCARDIOGRAM (TEE);  Surgeon: Jake Bathe, MD;  Location: [facility] ENDOSCOPY;  Service: Cardiovascular;  Laterality: N/A;  . TEE WITHOUT CARDIOVERSION N/A 09/07/2017   Procedure: TRANSESOPHAGEAL ECHOCARDIOGRAM (TEE);  Surgeon: Purcell Nails, MD;  Location: [facility] OR;  Service: Open Heart Surgery;  Laterality: N/A;  . TOOTH EXTRACTION     canines       Family History  Problem Relation Age of Onset  . Heart disease Father     Social History   Tobacco Use  . Smoking  status: Current Some Day Smoker  . Smokeless tobacco: Never Used  Substance Use Topics  . Alcohol use: Yes    Comment: Using for 25 years  . Drug use: Not Currently    Types: Marijuana, "Crack" cocaine, Methamphetamines    Comment: pt is in jail    Home Medications Prior to Admission medications   Medication Sig Start Date End Date Taking? Authorizing Provider  doxycycline (VIBRAMYCIN) 100 MG capsule Take 1 capsule (100 mg total) by mouth 2 (two) times  daily. 10/17/20   Rushie Chestnut, PA-C  gabapentin (NEURONTIN) 300 MG capsule 1 capsule 01/25/20   [provider]  hydrOXYzine (ATARAX/VISTARIL) 25 MG tablet Take 1 tablet (25 mg total) by mouth 3 (three) times daily as needed for anxiety. 05/07/20   Aldean Baker, NP  mirtazapine (REMERON) 15 MG tablet Take 1 tablet (15 mg total) by mouth at bedtime. 05/07/20   Aldean Baker, NP  SUBOXONE 4-1 MG FILM PLEASE SEE ATTACHED FOR DETAILED DIRECTIONS 08/12/20   [provider]    Allergies    Latex and Adhesive [tape]  Review of Systems   Review of Systems  Cardiovascular: Positive for chest pain.  All other systems reviewed and are negative.   Physical Exam Updated Vital Signs BP (!) 152/101   Pulse 72   Temp 98.7 F (37.1 C) (Oral)   Resp 12   SpO2 100%   Physical Exam Vitals and nursing note reviewed.  Constitutional:      Appearance: He is well-developed.  HENT:     Head: Normocephalic and atraumatic.  Cardiovascular:     Rate and Rhythm: Normal rate and regular rhythm.     Heart sounds: No murmur heard.   Pulmonary:     Effort: Pulmonary effort is normal. No respiratory distress.     Breath sounds: Normal breath sounds.  Abdominal:     Palpations: Abdomen is soft.     Tenderness: There is no abdominal tenderness. There is no guarding or rebound.  Musculoskeletal:        General: No tenderness.  Skin:    General: Skin is warm and dry.  Neurological:     Mental Status: He is alert and oriented to person, place, and time.  Psychiatric:        Behavior: Behavior normal.     ED Results / Procedures / Treatments   Labs (all labs ordered are listed, but only abnormal results are displayed) Labs Reviewed  RAPID URINE DRUG SCREEN, HOSP PERFORMED - Abnormal; Notable for the following components:      Result Value   Cocaine POSITIVE (*)    Tetrahydrocannabinol POSITIVE (*)    All other components within normal limits  BASIC METABOLIC PANEL  CBC   URINALYSIS, ROUTINE W REFLEX MICROSCOPIC  TROPONIN I (HIGH SENSITIVITY)    EKG EKG Interpretation  Date/Time:  Monday November 10 2020 16:35:28 EDT Ventricular Rate:  84 PR Interval:  138 QRS Duration: 70 QT Interval:  370 QTC Calculation: 437 Montoya Axis:   78 Text Interpretation: Normal sinus rhythm Possible Left atrial enlargement Nonspecific ST abnormality Abnormal ECG Confirmed by Tilden Fossa [phone]) on 11/10/2020 8:10:19 PM   Radiology DG Chest 2 View  Result Date: 11/10/2020 CLINICAL DATA:  Chest pain. EXAM: CHEST - 2 VIEW COMPARISON:  Jan 03, 2020 FINDINGS: The heart size and mediastinal contours are within normal limits. An artificial cardiac valve is seen. Both lungs are clear. The visualized skeletal structures are unremarkable. IMPRESSION: No  active cardiopulmonary disease. Electronically Signed   By: Aram Candela M.D.   On: 11/10/2020 17:25    Procedures Procedures   Medications Ordered in ED Medications - No data to display  ED Course  I have reviewed the triage vital signs and the nursing notes.  Pertinent labs & imaging results that were available during my care of the patient were reviewed by me and considered in my medical decision making (see chart for details).    MDM Rules/Calculators/A&P                         patient with history of mitral valve repair, here for evaluation of chest pain for the last several days. EKG is without acute ischemic changes in troponin is negative. He does have pain with movement of his upper extremities, feel that pain is likely musculoskeletal due to increased stress. Presentation is not consistent with ACS, PE, dissection. Recommend rest, ibuprofen PRN according to label instructions as well as outpatient follow-up and return precautions. Final Clinical Impression(s) / ED Diagnoses Final diagnoses:  Chest wall pain    Rx / DC Orders ED Discharge Orders    None       Tilden Fossa, MD 11/10/20 2301

## 2021-01-23 ENCOUNTER — Emergency Department (HOSPITAL_COMMUNITY): Payer: Medicaid Other

## 2021-01-23 ENCOUNTER — Emergency Department (HOSPITAL_COMMUNITY)
Admission: EM | Admit: 2021-01-23 | Discharge: 2021-01-23 | Disposition: A | Discharge status: Home / Self Care | Payer: Medicaid Other | Attending: Emergency Medicine | Admitting: Emergency Medicine

## 2021-01-23 ENCOUNTER — Encounter (HOSPITAL_COMMUNITY): Payer: Self-pay

## 2021-01-23 ENCOUNTER — Other Ambulatory Visit: Payer: Self-pay

## 2021-01-23 DIAGNOSIS — Z7982 Long term (current) use of aspirin: Secondary | ICD-10-CM | POA: Diagnosis not present

## 2021-01-23 DIAGNOSIS — R61 Generalized hyperhidrosis: Secondary | ICD-10-CM | POA: Diagnosis not present

## 2021-01-23 DIAGNOSIS — Z91040 Latex allergy status: Secondary | ICD-10-CM | POA: Diagnosis not present

## 2021-01-23 DIAGNOSIS — F17200 Nicotine dependence, unspecified, uncomplicated: Secondary | ICD-10-CM | POA: Insufficient documentation

## 2021-01-23 DIAGNOSIS — R0602 Shortness of breath: Secondary | ICD-10-CM | POA: Insufficient documentation

## 2021-01-23 DIAGNOSIS — M549 Dorsalgia, unspecified: Secondary | ICD-10-CM | POA: Insufficient documentation

## 2021-01-23 DIAGNOSIS — R0789 Other chest pain: Secondary | ICD-10-CM | POA: Insufficient documentation

## 2021-01-23 LAB — CBC WITH DIFFERENTIAL/PLATELET
Abs Immature Granulocytes: 0.03 10*3/uL (ref 0.00–0.07)
Basophils Absolute: 0.1 10*3/uL (ref 0.0–0.1)
Basophils Relative: 1 %
Eosinophils Absolute: 0.1 10*3/uL (ref 0.0–0.5)
Eosinophils Relative: 1 %
HCT: 44.6 % (ref 39.0–52.0)
Hemoglobin: 15 g/dL (ref 13.0–17.0)
Immature Granulocytes: 0 %
Lymphocytes Relative: 22 %
Lymphs Abs: 1.9 10*3/uL (ref 0.7–4.0)
MCH: 31 pg (ref 26.0–34.0)
MCHC: 33.6 g/dL (ref 30.0–36.0)
MCV: 92.1 fL (ref 80.0–100.0)
Monocytes Absolute: 0.7 10*3/uL (ref 0.1–1.0)
Monocytes Relative: 8 %
Neutro Abs: 6.1 10*3/uL (ref 1.7–7.7)
Neutrophils Relative %: 68 %
Platelets: 332 10*3/uL (ref 150–400)
RBC: 4.84 MIL/uL (ref 4.22–5.81)
RDW: 15.6 % — ABNORMAL HIGH (ref 11.5–15.5)
WBC: 8.9 10*3/uL (ref 4.0–10.5)
nRBC: 0 % (ref 0.0–0.2)

## 2021-01-23 LAB — BASIC METABOLIC PANEL
Anion gap: 6 (ref 5–15)
BUN: 18 mg/dL (ref 6–20)
CO2: 26 mmol/L (ref 22–32)
Calcium: 9.5 mg/dL (ref 8.9–10.3)
Chloride: 105 mmol/L (ref 98–111)
Creatinine, Ser: 1.18 mg/dL (ref 0.61–1.24)
GFR, Estimated: >60 mL/min (ref >60)
Glucose, Bld: 81 mg/dL (ref 70–99)
Potassium: 4.8 mmol/L (ref 3.5–5.1)
Sodium: 137 mmol/L (ref 135–145)

## 2021-01-23 LAB — TROPONIN I (HIGH SENSITIVITY)
Troponin I (High Sensitivity): 5 ng/L (ref <18)
Troponin I (High Sensitivity): 7 ng/L (ref <18)

## 2021-01-23 NOTE — ED Notes (Signed)
Dc instructions reviewed with pt. Pt verbalized instructions.  Pt Dc.  

## 2021-01-23 NOTE — Care Management (Addendum)
ED CM arranged uber transport home, waiver was signed and sent to medical records.

## 2021-01-23 NOTE — ED Triage Notes (Signed)
Patient arrives via EMS from fire department due to worsening chest pain that initially started last night. Per ems, the patient developed chest tightness overnight and took 324mg  if Asprin and went back to sleep.   Today, he was walking to the store and developed the same central chest tightness. Went to the fire department for help, was given 324mg  of asprin and 2 nitro en route. Decreasing pain from a 6/10 to a 2/10.   Hx of heart surgery (mitral valve). Not currently following up with cardiology outpatient.

## 2021-01-23 NOTE — ED Provider Notes (Signed)
[name] EMERGENCY DEPARTMENT Provider Note   CSN: [id number] Arrival date & time: 01/23/21  1444     History Chief Complaint  Patient presents with  . Back Pain  . Chest Pain    Steven Montoya. is a 45 y.o. male.  45 year old male with past medical history below including anxiety/depression, ADHD, diverticulitis, mitral valve repair who presents with chest pain. Last night around 12:00, he was awakened from sleep with central, non-radiating, squeezing chest pain that has been intermittent since then. It lasts several hours at a time and is worse w/ exertion. Last night, he took 325mg  ASA and melatonin and went back to sleep. Today, he was walking to the store and had the same chest tightness. He was given 324mg  ASA at the fire department and NTG x 2. NTG improved the pain; pain currently resolved. He reports associated diaphoresis and SOB. He denies recent cough/cold symptoms or leg swelling. He's currently on suboxone and denies drug use. He smokes 2 cigarettes daily.   FH notable for parents who both died of heart disease in their 59s.   The history is provided by the patient.  Back Pain Associated symptoms: chest pain   Chest Pain Associated symptoms: back pain        Past Medical History:  Diagnosis Date  . ADHD   . Anxiety   . Chronic urethral stricture   . Depression   . Diverticulitis   . Frequency of urination   . Headache(784.0)   . Lower abdominal pain   . Mitral valve prolapse   . Nocturia   . PTSD (post-traumatic stress disorder)   . 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 Urgency of urination   . Urinary straining     Patient Active Problem List   Diagnosis Date Noted  . Major depressive disorder, recurrent episode, severe (HCC)  04/26/2020  . Opiate dependence (HCC) 04/25/2020  . 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 MVP (mitral valve prolapse) 10/02/2016  . Non-rheumatic mitral regurgitation 10/02/2016  . Smoker 10/02/2016  . 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 Depressive disorder 12/31/2015  . Rectal bleeding 07/25/2013    Past Surgical History:  Procedure Laterality Date  . CARDIAC SURGERY    . COLONOSCOPY N/A 08/17/2013   Procedure: COLONOSCOPY;  Surgeon: 07/27/2013, MD;  Location: [facility] ENDOSCOPY;  Service: Endoscopy;  Laterality: N/A;  . CYSTO/ RETROGRADE PYELOGRAM/ URETHRAL DILATION  11-05-2010  . MITRAL VALVE REPAIR Right 09/07/2017   Procedure: MINIMALLY INVASIVE MITRAL VALVE REPAIR (MVR) using Sorin Memo 3D Ring size 34;  Surgeon: 01-05-2011, MD;  Location: [facility] OR;  Service: Open Heart Surgery;  Laterality: Right;  . RIGHT/LEFT HEART CATH AND CORONARY ANGIOGRAPHY N/A 07/22/2017   Procedure: RIGHT/LEFT HEART CATH AND CORONARY ANGIOGRAPHY;  Surgeon: Purcell Nails, MD;  Location: [facility] INVASIVE CV LAB;  Service: Cardiovascular;  Laterality: N/A;  . TEE WITHOUT CARDIOVERSION N/A 06/28/2017   Procedure: TRANSESOPHAGEAL ECHOCARDIOGRAM (TEE);  Surgeon: [facility], MD;  Location: [facility] ENDOSCOPY;  Service: Cardiovascular;  Laterality: N/A;  . TEE WITHOUT CARDIOVERSION N/A 09/07/2017   Procedure: TRANSESOPHAGEAL ECHOCARDIOGRAM (TEE);  Surgeon: [facility], MD;  Location: [facility] OR;  Service: Open Heart Surgery;  Laterality: N/A;  . TOOTH EXTRACTION  canines       Family History  Problem Relation Age of Onset  . Heart disease Father     Social History   Tobacco Use  . Smoking status: Current Some Day Smoker  . Smokeless tobacco: Never Used  Substance Use Topics  . Alcohol use: Yes    Comment: Using for 25 years  . Drug use: Not Currently    Types: Marijuana, "Crack" cocaine, Methamphetamines    Comment: pt is in jail     Home Medications Prior to Admission medications   Medication Sig Start Date End Date Taking? Authorizing Provider  aspirin EC 81 MG tablet Take 81 mg by mouth daily. Swallow whole.   Yes [provider]  SUBOXONE 8-2 MG FILM Place under the tongue 3 (three) times daily. 01/14/21  Yes [provider]  doxycycline (VIBRAMYCIN) 100 MG capsule Take 1 capsule (100 mg total) by mouth 2 (two) times daily. Patient not taking: No sig reported 10/17/20   Rushie Chestnut, PA-C  hydrOXYzine (ATARAX/VISTARIL) 25 MG tablet Take 1 tablet (25 mg total) by mouth 3 (three) times daily as needed for anxiety. Patient not taking: No sig reported 05/07/20   Aldean Baker, NP  mirtazapine (REMERON) 15 MG tablet Take 1 tablet (15 mg total) by mouth at bedtime. Patient not taking: No sig reported 05/07/20   Aldean Baker, NP    Allergies    Latex and Adhesive [tape]  Review of Systems   Review of Systems  Cardiovascular: Positive for chest pain.  Musculoskeletal: Positive for back pain.   All other systems reviewed and are negative except that which was mentioned in HPI  Physical Exam Updated Vital Signs BP 106/87   Pulse 62   Temp 98 F (36.7 C) (Oral)   Resp 14   SpO2 97%   Physical Exam Vitals and nursing note reviewed.  Constitutional:      General: He is not in acute distress.    Appearance: Normal appearance.  HENT:     Head: Normocephalic and atraumatic.  Eyes:     Conjunctiva/sclera: Conjunctivae normal.  Cardiovascular:     Rate and Rhythm: Normal rate and regular rhythm.     Heart sounds: Normal heart sounds. No murmur heard.   Pulmonary:     Effort: Pulmonary effort is normal.     Breath sounds: Normal breath sounds.  Abdominal:     General: Abdomen is flat. Bowel sounds are normal. There is no distension.     Palpations: Abdomen is soft.     Tenderness: There is no abdominal tenderness.  Musculoskeletal:     Right lower leg: No edema.     Left lower  leg: No edema.  Skin:    General: Skin is warm and dry.  Neurological:     Mental Status: He is alert and oriented to person, place, and time.     Comments: fluent  Psychiatric:        Mood and Affect: Mood normal.        Behavior: Behavior normal.     ED Results / Procedures / Treatments   Labs (all labs ordered are listed, but only abnormal results are displayed) Labs Reviewed  CBC WITH DIFFERENTIAL/PLATELET - Abnormal; Notable for the following components:      Result Value   RDW 15.6 (*)    All other components within normal limits  BASIC METABOLIC PANEL  TROPONIN I (HIGH SENSITIVITY)  TROPONIN I (HIGH SENSITIVITY)  EKG EKG Interpretation  Date/Time:  Friday Jan 23 2021 14:49:54 EDT Ventricular Rate:  87 PR Interval:  124 QRS Duration: 77 QT Interval:  369 QTC Calculation: 444 R Axis:   76 Text Interpretation: Sinus rhythm Probable left atrial enlargement No significant change since last tracing Confirmed by Frederick Peers [phone]) on 01/23/2021 3:57:30 PM   Radiology DG Chest 2 View  Result Date: 01/23/2021 CLINICAL DATA:  45 year old male with chest pain. EXAM: CHEST - 2 VIEW COMPARISON:  Chest radiograph dated 11/10/2020 FINDINGS: No focal consolidation, pleural effusion, or pneumothorax. The cardiac silhouette is within limits. Mechanical cardiac valve. No acute osseous pathology. IMPRESSION: No active cardiopulmonary disease. Electronically Signed   By: Elgie Collard M.D.   On: 01/23/2021 16:02    Procedures Procedures   Medications Ordered in ED Medications - No data to display  ED Course  I have reviewed the triage vital signs and the nursing notes.  Pertinent labs & imaging results that were available during my care of the patient were reviewed by me and considered in my medical decision making (see chart for details).    MDM Rules/Calculators/A&P                          Alert, comfortable on exam, stating pain currently resolved.  Vital  signs normal.  EKG shows sinus rhythm, similar to previous.  No acute ischemic changes.  Chest x-ray clear.  Patient has no risk factors for PE and is PERC negative therefore I do not feel he needs further work-up for this.  Lab work including serial troponins is reassuring.  Chart review shows that he had clean coronaries on cardiac cath in 2019.  Given this information combined with negative serial troponins here and reassuring EKG, resolution of symptoms, I feel that he is safe for discharge.  I have provided with cardiology follow-up information if patient continues to have symptoms and have extensively reviewed return precautions.  Patient voiced understanding. Final Clinical Impression(s) / ED Diagnoses Final diagnoses:  Atypical chest pain    Rx / DC Orders ED Discharge Orders    None       Natane Heward, Ambrose Finland, MD 01/23/21 2043

## 2022-08-27 ENCOUNTER — Emergency Department (HOSPITAL_COMMUNITY): Payer: Medicaid Other

## 2022-08-27 ENCOUNTER — Encounter (HOSPITAL_COMMUNITY): Payer: Self-pay | Admitting: Student

## 2022-08-27 ENCOUNTER — Other Ambulatory Visit: Payer: Self-pay

## 2022-08-27 ENCOUNTER — Inpatient Hospital Stay (HOSPITAL_COMMUNITY): Payer: Medicaid Other | Admitting: Anesthesiology

## 2022-08-27 ENCOUNTER — Inpatient Hospital Stay (HOSPITAL_COMMUNITY): Payer: Medicaid Other

## 2022-08-27 ENCOUNTER — Inpatient Hospital Stay (HOSPITAL_COMMUNITY)
Admission: EM | Admit: 2022-08-27 | Discharge: 2022-08-31 | DRG: 493 | Disposition: A | Discharge status: Home Health | Payer: Medicaid Other | Attending: Student | Admitting: Student

## 2022-08-27 ENCOUNTER — Encounter (HOSPITAL_COMMUNITY)
Admission: EM | Disposition: A | Discharge status: Home Health | Payer: Self-pay | Source: Home / Self Care | Attending: Student

## 2022-08-27 DIAGNOSIS — Z8249 Family history of ischemic heart disease and other diseases of the circulatory system: Secondary | ICD-10-CM | POA: Diagnosis not present

## 2022-08-27 DIAGNOSIS — Y9355 Activity, bike riding: Secondary | ICD-10-CM | POA: Diagnosis not present

## 2022-08-27 DIAGNOSIS — S82261B Displaced segmental fracture of shaft of right tibia, initial encounter for open fracture type I or II: Secondary | ICD-10-CM | POA: Diagnosis not present

## 2022-08-27 DIAGNOSIS — Z91048 Other nonmedicinal substance allergy status: Secondary | ICD-10-CM | POA: Diagnosis not present

## 2022-08-27 DIAGNOSIS — Z79899 Other long term (current) drug therapy: Secondary | ICD-10-CM | POA: Diagnosis not present

## 2022-08-27 DIAGNOSIS — D62 Acute posthemorrhagic anemia: Secondary | ICD-10-CM | POA: Diagnosis not present

## 2022-08-27 DIAGNOSIS — S82251B Displaced comminuted fracture of shaft of right tibia, initial encounter for open fracture type I or II: Principal | ICD-10-CM | POA: Diagnosis present

## 2022-08-27 DIAGNOSIS — S8291XB Unspecified fracture of right lower leg, initial encounter for open fracture type I or II: Principal | ICD-10-CM

## 2022-08-27 DIAGNOSIS — Y92410 Unspecified street and highway as the place of occurrence of the external cause: Secondary | ICD-10-CM

## 2022-08-27 DIAGNOSIS — F17210 Nicotine dependence, cigarettes, uncomplicated: Secondary | ICD-10-CM | POA: Diagnosis not present

## 2022-08-27 DIAGNOSIS — F909 Attention-deficit hyperactivity disorder, unspecified type: Secondary | ICD-10-CM | POA: Diagnosis present

## 2022-08-27 DIAGNOSIS — F32A Depression, unspecified: Secondary | ICD-10-CM | POA: Diagnosis present

## 2022-08-27 DIAGNOSIS — F4310 Post-traumatic stress disorder, unspecified: Secondary | ICD-10-CM | POA: Diagnosis present

## 2022-08-27 DIAGNOSIS — Z91040 Latex allergy status: Secondary | ICD-10-CM

## 2022-08-27 HISTORY — PX: TIBIA IM NAIL INSERTION: SHX2516

## 2022-08-27 LAB — CBC
HCT: 40.6 % (ref 39.0–52.0)
HCT: 31.5 % — ABNORMAL LOW (ref 39.0–52.0)
Hemoglobin: 13.6 g/dL (ref 13.0–17.0)
Hemoglobin: 10.8 g/dL — ABNORMAL LOW (ref 13.0–17.0)
MCH: 30.6 pg (ref 26.0–34.0)
MCH: 31.3 pg (ref 26.0–34.0)
MCHC: 33.5 g/dL (ref 30.0–36.0)
MCHC: 34.3 g/dL (ref 30.0–36.0)
MCV: 91.2 fL (ref 80.0–100.0)
MCV: 91.3 fL (ref 80.0–100.0)
Platelets: 296 10*3/uL (ref 150–400)
Platelets: 211 10*3/uL (ref 150–400)
RBC: 4.45 MIL/uL (ref 4.22–5.81)
RBC: 3.45 MIL/uL — ABNORMAL LOW (ref 4.22–5.81)
RDW: 14.1 % (ref 11.5–15.5)
RDW: 14 % (ref 11.5–15.5)
WBC: 5.8 10*3/uL (ref 4.0–10.5)
WBC: 10.9 10*3/uL — ABNORMAL HIGH (ref 4.0–10.5)
nRBC: 0 % (ref 0.0–0.2)
nRBC: 0.6 % — ABNORMAL HIGH (ref 0.0–0.2)

## 2022-08-27 LAB — COMPREHENSIVE METABOLIC PANEL
ALT: 11 U/L (ref 0–44)
AST: 26 U/L (ref 15–41)
Albumin: 3.8 g/dL (ref 3.5–5.0)
Alkaline Phosphatase: 55 U/L (ref 38–126)
Anion gap: 14 (ref 5–15)
BUN: 16 mg/dL (ref 6–20)
CO2: 20 mmol/L — ABNORMAL LOW (ref 22–32)
Calcium: 9.8 mg/dL (ref 8.9–10.3)
Chloride: 107 mmol/L (ref 98–111)
Creatinine, Ser: 1.64 mg/dL — ABNORMAL HIGH (ref 0.61–1.24)
GFR, Estimated: 52 mL/min — ABNORMAL LOW (ref >60)
Glucose, Bld: 78 mg/dL (ref 70–99)
Potassium: 4.4 mmol/L (ref 3.5–5.1)
Sodium: 141 mmol/L (ref 135–145)
Total Bilirubin: 0.4 mg/dL (ref 0.3–1.2)
Total Protein: 6.3 g/dL — ABNORMAL LOW (ref 6.5–8.1)

## 2022-08-27 LAB — SURGICAL PCR SCREEN
MRSA, PCR: POSITIVE — AB
Staphylococcus aureus: POSITIVE — AB

## 2022-08-27 LAB — SAMPLE TO BLOOD BANK

## 2022-08-27 LAB — PROTIME-INR
INR: 1 (ref 0.8–1.2)
Prothrombin Time: 13.3 seconds (ref 11.4–15.2)

## 2022-08-27 LAB — I-STAT CHEM 8, ED
BUN: 16 mg/dL (ref 6–20)
Calcium, Ion: 1.16 mmol/L (ref 1.15–1.40)
Chloride: 106 mmol/L (ref 98–111)
Creatinine, Ser: 1.6 mg/dL — ABNORMAL HIGH (ref 0.61–1.24)
Glucose, Bld: 72 mg/dL (ref 70–99)
HCT: 42 % (ref 39.0–52.0)
Hemoglobin: 14.3 g/dL (ref 13.0–17.0)
Potassium: 4.3 mmol/L (ref 3.5–5.1)
Sodium: 141 mmol/L (ref 135–145)
TCO2: 19 mmol/L — ABNORMAL LOW (ref 22–32)

## 2022-08-27 LAB — ETHANOL: Alcohol, Ethyl (B): <10 mg/dL (ref <10)

## 2022-08-27 LAB — LACTIC ACID, PLASMA: Lactic Acid, Venous: 7.9 mmol/L (ref 0.5–1.9)

## 2022-08-27 SURGERY — INSERTION, INTRAMEDULLARY ROD, TIBIA
Anesthesia: General | Site: Leg Lower | Laterality: Right

## 2022-08-27 MED ORDER — METOCLOPRAMIDE HCL 5 MG/ML IJ SOLN: 5.0000 mg | Freq: Three times a day (TID) | INTRAMUSCULAR | Status: DC | PRN

## 2022-08-27 MED ORDER — SODIUM CHLORIDE 0.9 % IV SOLN: 1000.0000 mL | INTRAVENOUS | Status: DC

## 2022-08-27 MED ORDER — SODIUM CHLORIDE 0.9 % IR SOLN
Status: DC | PRN
  Administered 2022-08-27: 6000 mL

## 2022-08-27 MED ORDER — HYDROMORPHONE HCL 1 MG/ML IJ SOLN
INTRAMUSCULAR | Status: AC
  Filled 2022-08-27: qty 2

## 2022-08-27 MED ORDER — LACTATED RINGERS IV SOLN: INTRAVENOUS | Status: DC

## 2022-08-27 MED ORDER — ONDANSETRON HCL 4 MG/2ML IJ SOLN
INTRAMUSCULAR | Status: DC | PRN
  Administered 2022-08-27: 4 mg via INTRAVENOUS

## 2022-08-27 MED ORDER — HYDROMORPHONE HCL 1 MG/ML IJ SOLN
1.0000 mg | INTRAMUSCULAR | Status: DC | PRN
  Administered 2022-08-27 (×3): 1 mg via INTRAVENOUS
  Filled 2022-08-27 (×3): qty 1

## 2022-08-27 MED ORDER — CHLORHEXIDINE GLUCONATE 4 % EX LIQD: 60.0000 mL | Freq: Once | CUTANEOUS | Status: DC

## 2022-08-27 MED ORDER — ENOXAPARIN SODIUM 40 MG/0.4ML IJ SOSY: 40.0000 mg | PREFILLED_SYRINGE | INTRAMUSCULAR | Status: DC

## 2022-08-27 MED ORDER — METOCLOPRAMIDE HCL 5 MG PO TABS: 5.0000 mg | ORAL_TABLET | Freq: Three times a day (TID) | ORAL | Status: DC | PRN

## 2022-08-27 MED ORDER — DOCUSATE SODIUM 100 MG PO CAPS
100.0000 mg | ORAL_CAPSULE | Freq: Two times a day (BID) | ORAL | Status: DC
  Administered 2022-08-27 – 2022-08-31 (×8): 100 mg via ORAL
  Filled 2022-08-27 (×8): qty 1

## 2022-08-27 MED ORDER — HYDROMORPHONE HCL 1 MG/ML IJ SOLN
INTRAMUSCULAR | Status: AC
  Administered 2022-08-27: 0.5 mg via INTRAVENOUS
  Filled 2022-08-27: qty 1

## 2022-08-27 MED ORDER — DEXAMETHASONE SODIUM PHOSPHATE 10 MG/ML IJ SOLN
INTRAMUSCULAR | Status: DC | PRN
  Administered 2022-08-27: 10 mg via INTRAVENOUS

## 2022-08-27 MED ORDER — KETAMINE HCL 50 MG/5ML IJ SOSY
PREFILLED_SYRINGE | INTRAMUSCULAR | Status: AC
  Filled 2022-08-27: qty 10

## 2022-08-27 MED ORDER — CEFAZOLIN SODIUM-DEXTROSE 2-4 GM/100ML-% IV SOLN
2.0000 g | INTRAVENOUS | Status: AC
  Administered 2022-08-27: 2 g via INTRAVENOUS
  Filled 2022-08-27: qty 100

## 2022-08-27 MED ORDER — POLYETHYLENE GLYCOL 3350 17 G PO PACK: 17.0000 g | PACK | Freq: Every day | ORAL | Status: DC | PRN

## 2022-08-27 MED ORDER — MIDAZOLAM HCL 2 MG/2ML IJ SOLN
INTRAMUSCULAR | Status: AC
  Filled 2022-08-27: qty 2

## 2022-08-27 MED ORDER — SUGAMMADEX SODIUM 200 MG/2ML IV SOLN
INTRAVENOUS | Status: DC | PRN
  Administered 2022-08-27: 200 mg via INTRAVENOUS

## 2022-08-27 MED ORDER — SODIUM CHLORIDE 0.9 % IV SOLN: INTRAVENOUS | Status: DC

## 2022-08-27 MED ORDER — CHLORHEXIDINE GLUCONATE 0.12 % MT SOLN: 15.0000 mL | Freq: Once | OROMUCOSAL | Status: AC

## 2022-08-27 MED ORDER — ROCURONIUM BROMIDE 10 MG/ML (PF) SYRINGE
PREFILLED_SYRINGE | INTRAVENOUS | Status: AC
  Filled 2022-08-27: qty 10

## 2022-08-27 MED ORDER — HYDROMORPHONE HCL 1 MG/ML IJ SOLN
INTRAMUSCULAR | Status: AC
  Administered 2022-08-27: 1 mg via INTRAVENOUS
  Filled 2022-08-27: qty 1

## 2022-08-27 MED ORDER — CHLORHEXIDINE GLUCONATE 0.12 % MT SOLN
OROMUCOSAL | Status: AC
  Administered 2022-08-27: 15 mL via OROMUCOSAL
  Filled 2022-08-27: qty 15

## 2022-08-27 MED ORDER — ENOXAPARIN SODIUM 40 MG/0.4ML IJ SOSY
40.0000 mg | PREFILLED_SYRINGE | INTRAMUSCULAR | Status: DC
  Administered 2022-08-28: 40 mg via SUBCUTANEOUS
  Filled 2022-08-27 (×2): qty 0.4

## 2022-08-27 MED ORDER — HYDROMORPHONE HCL 1 MG/ML IJ SOLN: 1.0000 mg | Freq: Once | INTRAMUSCULAR | Status: AC

## 2022-08-27 MED ORDER — POVIDONE-IODINE 10 % EX SWAB
2.0000 | Freq: Once | CUTANEOUS | Status: AC
  Administered 2022-08-27: 2 via TOPICAL

## 2022-08-27 MED ORDER — HYDRALAZINE HCL 10 MG PO TABS: 10.0000 mg | ORAL_TABLET | Freq: Four times a day (QID) | ORAL | Status: DC | PRN

## 2022-08-27 MED ORDER — KETOROLAC TROMETHAMINE 30 MG/ML IJ SOLN
INTRAMUSCULAR | Status: AC
  Filled 2022-08-27: qty 1

## 2022-08-27 MED ORDER — KETAMINE HCL-SODIUM CHLORIDE 100-0.9 MG/10ML-% IV SOSY
PREFILLED_SYRINGE | INTRAVENOUS | Status: DC | PRN
  Administered 2022-08-27: 50 mg via INTRAVENOUS
  Administered 2022-08-27 (×2): 25 mg via INTRAVENOUS

## 2022-08-27 MED ORDER — 0.9 % SODIUM CHLORIDE (POUR BTL) OPTIME
TOPICAL | Status: DC | PRN
  Administered 2022-08-27: 1000 mL

## 2022-08-27 MED ORDER — ACETAMINOPHEN 500 MG PO TABS
1000.0000 mg | ORAL_TABLET | Freq: Four times a day (QID) | ORAL | Status: DC
  Administered 2022-08-27 – 2022-08-31 (×16): 1000 mg via ORAL
  Filled 2022-08-27 (×16): qty 2

## 2022-08-27 MED ORDER — SODIUM CHLORIDE 0.9 % IV BOLUS (SEPSIS)
1000.0000 mL | Freq: Once | INTRAVENOUS | Status: AC
  Administered 2022-08-27: 1000 mL via INTRAVENOUS

## 2022-08-27 MED ORDER — MIDAZOLAM HCL 2 MG/2ML IJ SOLN
INTRAMUSCULAR | Status: AC
  Administered 2022-08-27: 2 mg via INTRAVENOUS
  Filled 2022-08-27: qty 2

## 2022-08-27 MED ORDER — ONDANSETRON HCL 4 MG/2ML IJ SOLN: 4.0000 mg | Freq: Once | INTRAMUSCULAR | Status: DC | PRN

## 2022-08-27 MED ORDER — TETANUS-DIPHTH-ACELL PERTUSSIS 5-2.5-18.5 LF-MCG/0.5 IM SUSY
0.5000 mL | PREFILLED_SYRINGE | Freq: Once | INTRAMUSCULAR | Status: DC
  Filled 2022-08-27: qty 0.5

## 2022-08-27 MED ORDER — FENTANYL CITRATE (PF) 250 MCG/5ML IJ SOLN
INTRAMUSCULAR | Status: DC | PRN
  Administered 2022-08-27 (×2): 100 ug via INTRAVENOUS

## 2022-08-27 MED ORDER — MORPHINE SULFATE (PF) 2 MG/ML IV SOLN: 2.0000 mg | INTRAVENOUS | Status: DC | PRN

## 2022-08-27 MED ORDER — KETOROLAC TROMETHAMINE 15 MG/ML IJ SOLN
15.0000 mg | Freq: Four times a day (QID) | INTRAMUSCULAR | Status: AC
  Administered 2022-08-27 – 2022-08-28 (×3): 15 mg via INTRAVENOUS
  Filled 2022-08-27 (×3): qty 1

## 2022-08-27 MED ORDER — FENTANYL CITRATE (PF) 100 MCG/2ML IJ SOLN
INTRAMUSCULAR | Status: AC
  Administered 2022-08-27: 100 ug via INTRAVENOUS
  Filled 2022-08-27: qty 2

## 2022-08-27 MED ORDER — DIPHENHYDRAMINE HCL 12.5 MG/5ML PO ELIX: 12.5000 mg | ORAL_SOLUTION | ORAL | Status: DC | PRN

## 2022-08-27 MED ORDER — TETANUS-DIPHTH-ACELL PERTUSSIS 5-2.5-18.5 LF-MCG/0.5 IM SUSY: 0.5000 mL | PREFILLED_SYRINGE | Freq: Once | INTRAMUSCULAR | Status: DC

## 2022-08-27 MED ORDER — ONDANSETRON HCL 4 MG PO TABS: 4.0000 mg | ORAL_TABLET | Freq: Four times a day (QID) | ORAL | Status: DC | PRN

## 2022-08-27 MED ORDER — MIDAZOLAM HCL 2 MG/2ML IJ SOLN: 2.0000 mg | Freq: Once | INTRAMUSCULAR | Status: AC

## 2022-08-27 MED ORDER — ONDANSETRON HCL 4 MG/2ML IJ SOLN: 4.0000 mg | Freq: Four times a day (QID) | INTRAMUSCULAR | Status: DC | PRN

## 2022-08-27 MED ORDER — CEFAZOLIN SODIUM-DEXTROSE 2-4 GM/100ML-% IV SOLN
INTRAVENOUS | Status: AC
  Filled 2022-08-27: qty 100

## 2022-08-27 MED ORDER — FENTANYL CITRATE (PF) 250 MCG/5ML IJ SOLN
INTRAMUSCULAR | Status: AC
  Filled 2022-08-27: qty 5

## 2022-08-27 MED ORDER — LIDOCAINE 2% (20 MG/ML) 5 ML SYRINGE
INTRAMUSCULAR | Status: DC | PRN
  Administered 2022-08-27: 100 mg via INTRAVENOUS

## 2022-08-27 MED ORDER — CEFAZOLIN SODIUM-DEXTROSE 2-4 GM/100ML-% IV SOLN
2.0000 g | Freq: Three times a day (TID) | INTRAVENOUS | Status: AC
  Administered 2022-08-27 – 2022-08-28 (×3): 2 g via INTRAVENOUS
  Filled 2022-08-27 (×3): qty 100

## 2022-08-27 MED ORDER — OXYCODONE HCL 5 MG/5ML PO SOLN: 5.0000 mg | Freq: Once | ORAL | Status: DC | PRN

## 2022-08-27 MED ORDER — VANCOMYCIN HCL 1000 MG IV SOLR
INTRAVENOUS | Status: DC | PRN
  Administered 2022-08-27: 1000 mg via TOPICAL

## 2022-08-27 MED ORDER — HYDROMORPHONE HCL 1 MG/ML IJ SOLN: 0.5000 mg | Freq: Once | INTRAMUSCULAR | Status: AC

## 2022-08-27 MED ORDER — OXYCODONE HCL 5 MG PO TABS
5.0000 mg | ORAL_TABLET | ORAL | Status: DC | PRN
  Administered 2022-08-27 – 2022-08-29 (×9): 15 mg via ORAL
  Administered 2022-08-30: 10 mg via ORAL
  Administered 2022-08-30: 15 mg via ORAL
  Filled 2022-08-27 (×10): qty 3
  Filled 2022-08-27: qty 2
  Filled 2022-08-27: qty 3

## 2022-08-27 MED ORDER — VANCOMYCIN HCL 1000 MG IV SOLR
INTRAVENOUS | Status: AC
  Filled 2022-08-27: qty 20

## 2022-08-27 MED ORDER — FENTANYL CITRATE (PF) 100 MCG/2ML IJ SOLN: 100.0000 ug | Freq: Once | INTRAMUSCULAR | Status: AC

## 2022-08-27 MED ORDER — METHOCARBAMOL 1000 MG/10ML IJ SOLN
500.0000 mg | Freq: Four times a day (QID) | INTRAVENOUS | Status: DC | PRN
  Administered 2022-08-28: 500 mg via INTRAVENOUS
  Filled 2022-08-27 (×2): qty 5

## 2022-08-27 MED ORDER — METHOCARBAMOL 500 MG PO TABS
500.0000 mg | ORAL_TABLET | Freq: Four times a day (QID) | ORAL | Status: DC | PRN
  Administered 2022-08-27 – 2022-08-29 (×2): 500 mg via ORAL
  Filled 2022-08-27 (×2): qty 1

## 2022-08-27 MED ORDER — PROPOFOL 10 MG/ML IV BOLUS
INTRAVENOUS | Status: AC
  Filled 2022-08-27: qty 20

## 2022-08-27 MED ORDER — ROCURONIUM BROMIDE 10 MG/ML (PF) SYRINGE
PREFILLED_SYRINGE | INTRAVENOUS | Status: DC | PRN
  Administered 2022-08-27: 30 mg via INTRAVENOUS
  Administered 2022-08-27: 70 mg via INTRAVENOUS

## 2022-08-27 MED ORDER — HYDROMORPHONE HCL 1 MG/ML IJ SOLN
1.0000 mg | INTRAMUSCULAR | Status: DC | PRN
  Administered 2022-08-27 – 2022-08-29 (×8): 1 mg via INTRAVENOUS
  Filled 2022-08-27 (×8): qty 1

## 2022-08-27 MED ORDER — PROPOFOL 10 MG/ML IV BOLUS
INTRAVENOUS | Status: DC | PRN
  Administered 2022-08-27: 150 mg via INTRAVENOUS

## 2022-08-27 MED ORDER — ORAL CARE MOUTH RINSE: 15.0000 mL | Freq: Once | OROMUCOSAL | Status: AC

## 2022-08-27 MED ORDER — MIDAZOLAM HCL 2 MG/2ML IJ SOLN
INTRAMUSCULAR | Status: DC | PRN
  Administered 2022-08-27: 2 mg via INTRAVENOUS

## 2022-08-27 MED ORDER — HYDROMORPHONE HCL 1 MG/ML IJ SOLN
0.2500 mg | INTRAMUSCULAR | Status: DC | PRN
  Administered 2022-08-27 (×4): 0.5 mg via INTRAVENOUS

## 2022-08-27 MED ORDER — KETOROLAC TROMETHAMINE 30 MG/ML IJ SOLN
30.0000 mg | Freq: Once | INTRAMUSCULAR | Status: AC | PRN
  Administered 2022-08-27: 30 mg via INTRAVENOUS

## 2022-08-27 MED ORDER — OXYCODONE HCL 5 MG PO TABS: 5.0000 mg | ORAL_TABLET | Freq: Once | ORAL | Status: DC | PRN

## 2022-08-27 MED ORDER — CEFAZOLIN SODIUM-DEXTROSE 2-4 GM/100ML-% IV SOLN
2.0000 g | INTRAVENOUS | Status: AC
  Administered 2022-08-27: 2 g via INTRAVENOUS

## 2022-08-27 SURGICAL SUPPLY — 63 items
APL PRP STRL LF DISP 70% ISPRP (MISCELLANEOUS) ×1
BAG COUNTER SPONGE SURGICOUNT (BAG) ×2 IMPLANT
BAG SPNG CNTER NS LX DISP (BAG) ×1
BIT DRILL FLEXIBLE LONG 12 (BIT) IMPLANT
BIT DRILL LONG 4.2 (BIT) IMPLANT
BIT DRILL SHORT 4.2 (BIT) IMPLANT
BLADE SURG 10 STRL SS (BLADE) ×4 IMPLANT
BNDG COHESIVE 4X5 TAN STRL (GAUZE/BANDAGES/DRESSINGS) ×2 IMPLANT
BNDG ELASTIC 4X5.8 VLCR STR LF (GAUZE/BANDAGES/DRESSINGS) ×2 IMPLANT
BNDG ELASTIC 6X5.8 VLCR STR LF (GAUZE/BANDAGES/DRESSINGS) ×2 IMPLANT
BNDG GAUZE DERMACEA FLUFF 4 (GAUZE/BANDAGES/DRESSINGS) ×2 IMPLANT
BNDG GZE DERMACEA 4 6PLY (GAUZE/BANDAGES/DRESSINGS)
BRUSH SCRUB EZ PLAIN DRY (MISCELLANEOUS) ×4 IMPLANT
CHLORAPREP W/TINT 26 (MISCELLANEOUS) ×2 IMPLANT
COVER SURGICAL LIGHT HANDLE (MISCELLANEOUS) ×4 IMPLANT
DRAPE C-ARM 42X72 X-RAY (DRAPES) ×2 IMPLANT
DRAPE C-ARMOR (DRAPES) ×2 IMPLANT
DRAPE HALF SHEET 40X57 (DRAPES) ×4 IMPLANT
DRAPE IMP U-DRAPE 54X76 (DRAPES) ×4 IMPLANT
DRAPE INCISE IOBAN 66X45 STRL (DRAPES) IMPLANT
DRAPE ORTHO SPLIT 77X108 STRL (DRAPES) ×2
DRAPE SURG ORHT 6 SPLT 77X108 (DRAPES) ×4 IMPLANT
DRAPE U-SHAPE 47X51 STRL (DRAPES) ×2 IMPLANT
DRILL BIT SHORT 4.2 (BIT) ×2
DRSG ADAPTIC 3X8 NADH LF (GAUZE/BANDAGES/DRESSINGS) ×2 IMPLANT
DRSG MEPITEL 4X7.2 (GAUZE/BANDAGES/DRESSINGS) IMPLANT
ELECT REM PT RETURN 9FT ADLT (ELECTROSURGICAL) ×1
ELECTRODE REM PT RTRN 9FT ADLT (ELECTROSURGICAL) ×2 IMPLANT
GAUZE SPONGE 4X4 12PLY STRL (GAUZE/BANDAGES/DRESSINGS) ×2 IMPLANT
GLOVE BIO SURGEON STRL SZ 6.5 (GLOVE) ×6 IMPLANT
GLOVE BIO SURGEON STRL SZ7.5 (GLOVE) ×8 IMPLANT
GLOVE BIOGEL PI IND STRL 6.5 (GLOVE) ×2 IMPLANT
GLOVE BIOGEL PI IND STRL 7.5 (GLOVE) ×2 IMPLANT
GOWN STRL REUS W/ TWL LRG LVL3 (GOWN DISPOSABLE) ×4 IMPLANT
GOWN STRL REUS W/TWL LRG LVL3 (GOWN DISPOSABLE) ×2
GUIDEWIRE 3.2X400 (WIRE) IMPLANT
HANDPIECE INTERPULSE COAX TIP (DISPOSABLE) ×1
KIT BASIN OR (CUSTOM PROCEDURE TRAY) ×2 IMPLANT
KIT TURNOVER KIT B (KITS) ×2 IMPLANT
NAIL TIB TFNA 9X345 (Nail) IMPLANT
PACK TOTAL JOINT (CUSTOM PROCEDURE TRAY) ×2 IMPLANT
PAD ARMBOARD 7.5X6 YLW CONV (MISCELLANEOUS) ×4 IMPLANT
PAD CAST 3X4 CTTN HI CHSV (CAST SUPPLIES) IMPLANT
PADDING CAST COTTON 3X4 STRL (CAST SUPPLIES) ×1
PADDING CAST COTTON 6X4 STRL (CAST SUPPLIES) IMPLANT
REAMER ROD DEEP FLUTE 2.5X950 (INSTRUMENTS) IMPLANT
SCREW LOCK IM 46X5XLOPRFL NS (Screw) IMPLANT
SCREW LOCK IM NAIL 5X46 (Screw) ×1 IMPLANT
SCREW LOCK LP 5.36 (Screw) IMPLANT
SCREW LOCK LP 5X38 LT (Screw) IMPLANT
SCREW LOCK LP 5X66 (Screw) IMPLANT
SET HNDPC FAN SPRY TIP SCT (DISPOSABLE) IMPLANT
STAPLER VISISTAT 35W (STAPLE) ×2 IMPLANT
SUT ETHILON 3 0 PS 1 (SUTURE) IMPLANT
SUT MNCRL AB 3-0 PS2 18 (SUTURE) ×2 IMPLANT
SUT MON AB 2-0 CT1 36 (SUTURE) IMPLANT
SUT VIC AB 0 CT1 27 (SUTURE) ×2
SUT VIC AB 0 CT1 27XBRD ANBCTR (SUTURE) IMPLANT
SUT VIC AB 2-0 CT1 27 (SUTURE) ×2
SUT VIC AB 2-0 CT1 TAPERPNT 27 (SUTURE) IMPLANT
TOWEL GREEN STERILE (TOWEL DISPOSABLE) ×4 IMPLANT
TOWEL GREEN STERILE FF (TOWEL DISPOSABLE) ×2 IMPLANT
YANKAUER SUCT BULB TIP NO VENT (SUCTIONS) IMPLANT

## 2022-08-27 NOTE — Anesthesia Procedure Notes (Signed)
Procedure Name: Intubation Date/Time: 08/27/2022 12:21 PM  Performed by: Rosiland Oz, CRNAPre-anesthesia Checklist: Patient identified, Emergency Drugs available, Suction available, Patient being monitored and Timeout performed Patient Re-evaluated:Patient Re-evaluated prior to induction Oxygen Delivery Method: Circle system utilized Preoxygenation: Pre-oxygenation with 100% oxygen Induction Type: IV induction Ventilation: Mask ventilation without difficulty Laryngoscope Size: Miller and 3 Grade View: Grade I Tube type: Oral Tube size: 7.5 mm Number of attempts: 1 Airway Equipment and Method: Stylet Placement Confirmation: ETT inserted through vocal cords under direct vision, positive ETCO2 and breath sounds checked- equal and bilateral Secured at: 21 cm Tube secured with: Tape Dental Injury: Teeth and Oropharynx as per pre-operative assessment
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
## 2022-08-27 NOTE — ED Notes (Signed)
Attempted to obtain repeat lactic acid, pt states he wants needle out, withdrawn prior to collecting sample.

## 2022-08-27 NOTE — Interval H&P Note (Signed)
History and Physical Interval Note:  08/27/2022 11:22 AM  Steven R Pernell Jr.  has presented today for surgery, with the diagnosis of Right Tibia Fx.  The various methods of treatment have been discussed with the patient and family. After consideration of risks, benefits and other options for treatment, the patient has consented to  Procedure(s): INTRAMEDULLARY (IM) NAIL TIBIAL WITH DEBRIDMENT (Right) as a surgical intervention.  The patient's history has been reviewed, patient examined, no change in status, stable for surgery.  I have reviewed the patient's chart and labs.  Questions were answered to the patient's satisfaction.     Caryn Bee P Xenia Nile

## 2022-08-27 NOTE — ED Provider Notes (Signed)
7:33 AM Care assumed for Dr. Eudelia Bunch.  At time of transfer of care, patient is awaiting trip to OR for open tib-fib fracture and is already sued antibiotics.  Anticipate disposition after operating room by orthopedics.   Steven Montoya, Canary Brim, MD 08/27/22 1451
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
## 2022-08-27 NOTE — Progress Notes (Signed)
Pt verbalized that Oxycodone 15mg  does not help with pain, only Dilaudid. Pt was educated PO before IV. Pt verbalizes that he has been drug free 6 years and continues with SUBOXONE which is why oxycodone is not "effective" for him. Pt verbalizes that he has been through a lot in life, he appears very anxious and restless. Spiritual consult placed to help him cope with his admission. Lab called d/t pt refusing blood work, pt was educated. Needs continuous reenforcement.
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
## 2022-08-27 NOTE — ED Notes (Signed)
TQ removed, +pedal pulse, bleeding controlled, xray complete
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
## 2022-08-27 NOTE — Anesthesia Preprocedure Evaluation (Signed)
Anesthesia Evaluation  Patient identified by MRN, date of birth, ID band Patient awake    Reviewed: Allergy & Precautions, H&P , NPO status , Patient's Chart, lab work & pertinent test results  Airway Mallampati: II  TM Distance: >3 FB Neck ROM: Full    Dental no notable dental hx.    Pulmonary Current Smoker and Patient abstained from smoking.   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   Neuro/Psych negative neurological ROS  negative psych ROS   GI/Hepatic negative GI ROS,,,(+)     substance abuse  alcohol use, cocaine use and methamphetamine use  Endo/Other  negative endocrine ROS    Renal/GU negative Renal ROS  negative genitourinary   Musculoskeletal negative musculoskeletal ROS (+)  narcotic dependent  Abdominal   Peds negative pediatric ROS (+)  Hematology negative hematology ROS (+)   Anesthesia Other Findings   Reproductive/Obstetrics negative OB ROS                             Anesthesia Physical Anesthesia Plan  ASA: 3  Anesthesia Plan: General   Post-op Pain Management: Ketamine IV*   Induction: Intravenous  PONV Risk Score and Plan: 1 and Ondansetron, Dexamethasone and Treatment may vary due to age or medical condition  Airway Management Planned: Oral ETT  Additional Equipment:   Intra-op Plan:   Post-operative Plan: Extubation in OR  Informed Consent: I have reviewed the patients History and Physical, chart, labs and discussed the procedure including the risks, benefits and alternatives for the proposed anesthesia with the patient or authorized representative who has indicated his/her understanding and acceptance.     Dental advisory given  Plan Discussed with: CRNA and Surgeon  Anesthesia Plan Comments:         Anesthesia Quick Evaluation
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
## 2022-08-27 NOTE — ED Notes (Signed)
TQ placed at 0120

## 2022-08-27 NOTE — Transfer of Care (Signed)
Immediate Anesthesia Transfer of Care Note  Patient: Steven Montoya.  Procedure(s) Performed: IRRIGATION AND DEBRIDEMENT INTRAMEDULLARY NAILING OF RIGHT TIBIA (Right: Leg Lower)  Patient Location: PACU  Anesthesia Type:General  Level of Consciousness: drowsy and patient cooperative  Airway & Oxygen Therapy: Patient Spontanous Breathing  Post-op Assessment: Report given to RN and Post -op Vital signs reviewed and stable  Post vital signs: Reviewed and stable  Last Vitals:  Vitals Value Taken Time  BP 132/117 08/27/22 1347  Temp    Pulse 94 08/27/22 1349  Resp 15 08/27/22 1349  SpO2 97 % 08/27/22 1349  Vitals shown include unvalidated device data.  Last Pain:  Vitals:   08/27/22 0808  TempSrc: Temporal  PainSc: 10-Worst pain ever         Complications: No notable events documented.
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
## 2022-08-27 NOTE — ED Notes (Signed)
Consulted EDP for more pain meds.

## 2022-08-28 LAB — BASIC METABOLIC PANEL
Anion gap: 7 (ref 5–15)
BUN: 17 mg/dL (ref 6–20)
CO2: 24 mmol/L (ref 22–32)
Calcium: 8.8 mg/dL — ABNORMAL LOW (ref 8.9–10.3)
Chloride: 107 mmol/L (ref 98–111)
Creatinine, Ser: 1.28 mg/dL — ABNORMAL HIGH (ref 0.61–1.24)
GFR, Estimated: >60 mL/min (ref >60)
Glucose, Bld: 128 mg/dL — ABNORMAL HIGH (ref 70–99)
Potassium: 4.6 mmol/L (ref 3.5–5.1)
Sodium: 138 mmol/L (ref 135–145)

## 2022-08-28 LAB — CBC
HCT: 27 % — ABNORMAL LOW (ref 39.0–52.0)
Hemoglobin: 9.1 g/dL — ABNORMAL LOW (ref 13.0–17.0)
MCH: 30.7 pg (ref 26.0–34.0)
MCHC: 33.7 g/dL (ref 30.0–36.0)
MCV: 91.2 fL (ref 80.0–100.0)
Platelets: 214 10*3/uL (ref 150–400)
RBC: 2.96 MIL/uL — ABNORMAL LOW (ref 4.22–5.81)
RDW: 14 % (ref 11.5–15.5)
WBC: 12.1 10*3/uL — ABNORMAL HIGH (ref 4.0–10.5)
nRBC: 0 % (ref 0.0–0.2)

## 2022-08-28 NOTE — Plan of Care (Signed)
  Problem: Education: Goal: Knowledge of General Education information will improve Description: Including pain rating scale, medication(s)/side effects and non-pharmacologic comfort measures Outcome: Progressing   Problem: Health Behavior/Discharge Planning: Goal: Ability to manage health-related needs will improve Outcome: Progressing   Problem: Clinical Measurements: Goal: Will remain free from infection Outcome: Progressing Goal: Diagnostic test results will improve Outcome: Progressing Goal: Respiratory complications will improve Outcome: Progressing Goal: Cardiovascular complication will be avoided Outcome: Progressing   Problem: Activity: Goal: Risk for activity intolerance will decrease Outcome: Progressing   Problem: Nutrition: Goal: Adequate nutrition will be maintained Outcome: Progressing   Problem: Safety: Goal: Ability to remain free from injury will improve Outcome: Progressing   Problem: Skin Integrity: Goal: Risk for impaired skin integrity will decrease Outcome: Progressing
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
## 2022-08-29 LAB — CBC
HCT: 23.2 % — ABNORMAL LOW (ref 39.0–52.0)
Hemoglobin: 7.7 g/dL — ABNORMAL LOW (ref 13.0–17.0)
MCH: 30.3 pg (ref 26.0–34.0)
MCHC: 33.2 g/dL (ref 30.0–36.0)
MCV: 91.3 fL (ref 80.0–100.0)
Platelets: 203 10*3/uL (ref 150–400)
RBC: 2.54 MIL/uL — ABNORMAL LOW (ref 4.22–5.81)
RDW: 14.1 % (ref 11.5–15.5)
WBC: 9.8 10*3/uL (ref 4.0–10.5)
nRBC: 0 % (ref 0.0–0.2)

## 2022-08-29 LAB — VITAMIN D 25 HYDROXY (VIT D DEFICIENCY, FRACTURES): Vit D, 25-Hydroxy: 29.79 ng/mL — ABNORMAL LOW (ref 30–100)

## 2022-08-29 MED ORDER — ASPIRIN 325 MG PO TABS
325.0000 mg | ORAL_TABLET | Freq: Every day | ORAL | Status: DC
  Administered 2022-08-29 – 2022-08-31 (×3): 325 mg via ORAL
  Filled 2022-08-29 (×3): qty 1

## 2022-08-29 MED ORDER — HYDROMORPHONE HCL 1 MG/ML IJ SOLN
1.0000 mg | INTRAMUSCULAR | Status: DC | PRN
  Administered 2022-08-29 – 2022-08-30 (×6): 1 mg via INTRAVENOUS
  Filled 2022-08-29 (×6): qty 1

## 2022-08-29 MED ORDER — GABAPENTIN 100 MG PO CAPS
100.0000 mg | ORAL_CAPSULE | Freq: Three times a day (TID) | ORAL | Status: DC
  Administered 2022-08-29 (×3): 100 mg via ORAL
  Filled 2022-08-29 (×3): qty 1

## 2022-08-29 NOTE — Progress Notes (Signed)
Orthopedic Tech Progress Note Patient Details:  Steven Montoya. 12/08/1975 [id number]  Ortho Devices Type of Ortho Device: CAM walker Ortho Device/Splint Location: RLE Ortho Device/Splint Interventions: Ordered, Adjustment   Post Interventions Patient Tolerated: Poor Instructions Provided: Care of device, Adjustment of device Cam boot dropped off with PT in patients room. [place] A Paylin Hailu 08/29/2022, 10:00 AM
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
## 2022-08-29 NOTE — Progress Notes (Signed)
   08/29/22 1735  Clinical Encounter Type  Visited With Patient  Visit Type Initial;Spiritual support  Referral From Nurse  Consult/Referral To Chaplain   Chaplain responded to a spiritual consult for prayer. The patient, Steven Montoya asked for prayer over a challenging situation he is facing. We talk for a bit and closed with prayer. As I departed the room [facility] asked for his nurse. I reached out to her as I left the unit.   Valerie Roys [facility]  [phone]

## 2022-08-30 LAB — CBC
HCT: 24.7 % — ABNORMAL LOW (ref 39.0–52.0)
Hemoglobin: 8.4 g/dL — ABNORMAL LOW (ref 13.0–17.0)
MCH: 30.9 pg (ref 26.0–34.0)
MCHC: 34 g/dL (ref 30.0–36.0)
MCV: 90.8 fL (ref 80.0–100.0)
Platelets: 237 10*3/uL (ref 150–400)
RBC: 2.72 MIL/uL — ABNORMAL LOW (ref 4.22–5.81)
RDW: 13.8 % (ref 11.5–15.5)
WBC: 8 10*3/uL (ref 4.0–10.5)
nRBC: 0 % (ref 0.0–0.2)

## 2022-08-30 MED ORDER — HYDROMORPHONE HCL 1 MG/ML IJ SOLN
2.0000 mg | INTRAMUSCULAR | Status: DC | PRN
  Administered 2022-08-30 – 2022-08-31 (×7): 2 mg via INTRAVENOUS
  Filled 2022-08-30 (×7): qty 2

## 2022-08-30 MED ORDER — METHOCARBAMOL 1000 MG/10ML IJ SOLN
500.0000 mg | Freq: Three times a day (TID) | INTRAVENOUS | Status: DC
  Filled 2022-08-30: qty 5

## 2022-08-30 MED ORDER — KETOROLAC TROMETHAMINE 15 MG/ML IJ SOLN: 15.0000 mg | Freq: Four times a day (QID) | INTRAMUSCULAR | Status: DC

## 2022-08-30 MED ORDER — OXYCODONE HCL 5 MG PO TABS
15.0000 mg | ORAL_TABLET | ORAL | Status: DC | PRN
  Administered 2022-08-30 (×2): 20 mg via ORAL
  Administered 2022-08-30: 10 mg via ORAL
  Administered 2022-08-30 – 2022-08-31 (×4): 20 mg via ORAL
  Filled 2022-08-30 (×5): qty 4
  Filled 2022-08-30: qty 3
  Filled 2022-08-30: qty 4

## 2022-08-30 MED ORDER — KETOROLAC TROMETHAMINE 15 MG/ML IJ SOLN
30.0000 mg | Freq: Four times a day (QID) | INTRAMUSCULAR | Status: DC
  Administered 2022-08-30 – 2022-08-31 (×6): 30 mg via INTRAVENOUS
  Filled 2022-08-30 (×6): qty 2

## 2022-08-30 MED ORDER — VITAMIN D 25 MCG (1000 UNIT) PO TABS
2000.0000 [IU] | ORAL_TABLET | Freq: Every day | ORAL | Status: DC
  Administered 2022-08-30 – 2022-08-31 (×2): 2000 [IU] via ORAL
  Filled 2022-08-30 (×2): qty 2

## 2022-08-30 MED ORDER — METHOCARBAMOL 500 MG PO TABS
1000.0000 mg | ORAL_TABLET | Freq: Three times a day (TID) | ORAL | Status: DC
  Administered 2022-08-30 – 2022-08-31 (×5): 1000 mg via ORAL
  Filled 2022-08-30 (×5): qty 2

## 2022-08-30 MED ORDER — GABAPENTIN 300 MG PO CAPS
300.0000 mg | ORAL_CAPSULE | Freq: Three times a day (TID) | ORAL | Status: DC
  Administered 2022-08-30 – 2022-08-31 (×5): 300 mg via ORAL
  Filled 2022-08-30 (×5): qty 1

## 2022-08-30 MED ORDER — TRAMADOL HCL 50 MG PO TABS: 50.0000 mg | ORAL_TABLET | Freq: Four times a day (QID) | ORAL | Status: DC | PRN

## 2022-08-30 MED ORDER — VITAMIN D 25 MCG (1000 UNIT) PO TABS: 1000.0000 [IU] | ORAL_TABLET | Freq: Every day | ORAL | Status: DC
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
## 2022-08-30 NOTE — Anesthesia Postprocedure Evaluation (Signed)
Anesthesia Post Note  Patient: Jaedan Huttner.  Procedure(s) Performed: IRRIGATION AND DEBRIDEMENT INTRAMEDULLARY NAILING OF RIGHT TIBIA (Right: Leg Lower)     Patient location during evaluation: PACU Anesthesia Type: General Level of consciousness: awake and alert Pain management: pain level controlled Vital Signs Assessment: post-procedure vital signs reviewed and stable Respiratory status: spontaneous breathing, nonlabored ventilation, respiratory function stable and patient connected to nasal cannula oxygen Cardiovascular status: blood pressure returned to baseline and stable Postop Assessment: no apparent nausea or vomiting Anesthetic complications: no  No notable events documented.  Last Vitals:  Vitals:   08/30/22 0244 08/30/22 0810  BP: 135/87 (!) 130/96  Pulse: 70 74  Resp:  20  Temp:  36.9 C  SpO2:  100%    Last Pain:  Vitals:   08/30/22 0810  TempSrc: Oral  PainSc:                  Esli Jernigan S

## 2022-08-30 NOTE — TOC CAGE-AID Note (Signed)
Transition of Care (TOC) - CAGE-AID Screening   Patient Details  Name: Steven Montoya. MRN: [id number] Date of Birth: [birth year]  [facility] CM/SW Contact:    Clovis Cao, RN Phone Number: [phone] 08/30/2022, 11:17 AM   Clinical Narrative: Pt states that he no longer does recreational drugs but he does still occasionally drink alcohol.  Pt does not need resources.  Screening complete.   CAGE-AID Screening:    Have You Ever Felt You Ought to Cut Down on Your Drinking or Drug Use?: No Have People Annoyed You By Critizing Your Drinking Or Drug Use?: No Have You Felt Bad Or Guilty About Your Drinking Or Drug Use?: No Have You Ever Had a Drink or Used Drugs First Thing In The Morning to Steady Your Nerves or to Get Rid of a Hangover?: No CAGE-AID Score: 0  Substance Abuse Education Offered: No

## 2022-08-30 NOTE — Progress Notes (Addendum)
Orthopaedic Trauma Progress Note  SUBJECTIVE: Pain better controlled today following adjustments of medications yesterday. Sleeping in bedside chair in bedside upon me entering room this morning. No chest pain. No SOB. No nausea/vomiting. No other complaints. Wants to try to go home today.  OBJECTIVE:  Vitals:   08/29/22 2024 08/30/22 0244  BP: 112/79 135/87  Pulse: 83 70  Resp:    Temp:    SpO2: 97%     General: Laying in bedside chair, NAD Respiratory: No increased work of breathing.  Extremity: Dressing CDI.  Cam boot in place. Able to wiggle toes. Foot warm and well perfused. Endorses sensation to light touch throughout the foot.   IMAGING: Stable post op imaging.   LABS:  No results found for this or any previous visit (from the past 24 hour(s)).   ASSESSMENT: Steven Montoya. is a 47 y.o. male, 3 Days Post-Op s/p IRRIGATION AND DEBRIDEMENT WITH INTRAMEDULLARY NAILING OF RIGHT TIBIA  CV/Blood loss: Acute blood loss anemia, Hgb 7.9 this morning. Hemodynamically stable  PLAN: Weightbearing: TDWB RLE ROM: OK for ROM as tolerated  Incisional and dressing care: OK to remove dressing and leave incisions open to air. Showering: Ok to shower, keep dressing dry Orthopedic device(s): CAM boot when OOB  Pain management:  1. Tylenol 1000 mg q 6 hours scheduled 2. Robaxin 1000 mg QID 3. Oxycodone 15-20 mg q 4 hours PRN 4. Dilaudid 2 mg q 2 hours PRN 5. Gabapentin 300 mg TID 6. Toradol 30 mg q 6 hours x 5 days VTE prophylaxis: Lovenox, SCDs ID:  Ancef 2gm post op completed Foley/Lines:  No foley, KVO IVFs Impediments to Fracture Healing: Open fracture. Vit D level 29, start supplementation  Dispo: PT/OT recommending HH. TOC following. Plan for d/c home later today vs tomorrow AM  D/C recommendations: - Oxycodone, Robaxin, Gabapentin for pain control - ASA 325 mg x 30 days for DVT prophylaxis - Continue 2000 units daily Vit D supplementation  Follow - up plan: 2 weeks after  d/c   Contact information:  Truitt Merle MD, Thyra Breed PA-C. After hours and holidays please check [email or web address] for group call information for [facility]   Thompson Caul, PA-C [phone] (office) [email or web address]

## 2022-08-31 ENCOUNTER — Encounter (HOSPITAL_COMMUNITY): Payer: Self-pay | Admitting: Student

## 2022-08-31 ENCOUNTER — Other Ambulatory Visit (HOSPITAL_COMMUNITY): Payer: Self-pay

## 2022-08-31 LAB — CBC
HCT: 23.7 % — ABNORMAL LOW (ref 39.0–52.0)
Hemoglobin: 7.9 g/dL — ABNORMAL LOW (ref 13.0–17.0)
MCH: 30.5 pg (ref 26.0–34.0)
MCHC: 33.3 g/dL (ref 30.0–36.0)
MCV: 91.5 fL (ref 80.0–100.0)
Platelets: 250 10*3/uL (ref 150–400)
RBC: 2.59 MIL/uL — ABNORMAL LOW (ref 4.22–5.81)
RDW: 14 % (ref 11.5–15.5)
WBC: 9.2 10*3/uL (ref 4.0–10.5)
nRBC: 0 % (ref 0.0–0.2)

## 2022-08-31 MED ORDER — ASPIRIN 325 MG PO TABS
325.0000 mg | ORAL_TABLET | Freq: Every day | ORAL | 0 refills | Status: AC
  Filled 2022-08-31: qty 30, 30d supply, fill #0

## 2022-08-31 MED ORDER — GABAPENTIN 300 MG PO CAPS
300.0000 mg | ORAL_CAPSULE | Freq: Three times a day (TID) | ORAL | 0 refills | Status: AC
  Filled 2022-08-31: qty 21, 7d supply, fill #0

## 2022-08-31 MED ORDER — VITAMIN D3 25 MCG PO TABS
2000.0000 [IU] | ORAL_TABLET | Freq: Every day | ORAL | 0 refills | Status: AC
  Filled 2022-08-31: qty 60, 30d supply, fill #0

## 2022-08-31 MED ORDER — ENSURE ENLIVE PO LIQD: 237.0000 mL | Freq: Two times a day (BID) | ORAL | Status: DC

## 2022-08-31 MED ORDER — METHOCARBAMOL 500 MG PO TABS
1000.0000 mg | ORAL_TABLET | Freq: Three times a day (TID) | ORAL | 0 refills | Status: AC
  Filled 2022-08-31: qty 42, 7d supply, fill #0

## 2022-08-31 MED ORDER — ACETAMINOPHEN 500 MG PO TABS: 1000.0000 mg | ORAL_TABLET | Freq: Four times a day (QID) | ORAL | 0 refills | Status: AC | PRN

## 2022-08-31 MED ORDER — OXYCODONE HCL 10 MG PO TABS
10.0000 mg | ORAL_TABLET | ORAL | 0 refills | Status: DC | PRN
  Filled 2022-08-31: qty 84, 7d supply, fill #0
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
## 2022-08-31 NOTE — Progress Notes (Signed)
Occupational Therapy Discharge Patient Details Name: Maurice Fotheringham. MRN: [id number] DOB: 1976/02/22 Today's Date: 08/31/2022 Time: 8676-7209 OT Time Calculation (min): 5 min  Patient discharged from OT services secondary to goals met and no further OT needs identified.  4/4 LTGs met this date. Pt has progressed in mobility and BADL tasks since initial evaluation. He requires Min Guard - SBA with RW for functional mobility. Working on pain management with medication. No further acute OT needs identified at this time; will sign off.   Progress and discharge plan discussed with patient and/or caregiver: Patient/Caregiver agrees with plan       Ailene Ravel, OTR/L,CBIS  Supplemental OT - [facility]  08/31/2022, 11:52 AM

## 2022-08-31 NOTE — Progress Notes (Signed)
Mobility Specialist Progress Note   08/31/22 1149  Mobility  Activity Ambulated with assistance in hallway  Level of Assistance Standby assist, set-up cues, supervision of patient - no hands on  Assistive Device Front wheel walker  Distance Ambulated (ft) 140 ft  RLE Weight Bearing TWB  Activity Response Tolerated well  $Mobility charge 1 Mobility   Received pt in doorway eager to ambulate having minimal pain(3/10). Ambulated w/ a fair hop gait but requiring cues on L foot placement, pt tends to land in front of RW. Pt also unsure of WB precautions and needing reeducation. No faults throughout and returned back to chair, call bell in reach.  Holland Falling Mobility Specialist Please contact via SecureChat or  Rehab office at [phone]

## 2022-08-31 NOTE — Discharge Instructions (Signed)
Orthopaedic Trauma Service Discharge Instructions   General Discharge Instructions  WEIGHT BEARING STATUS:touchdown weightbearing right lower extremity  RANGE OF MOTION/ACTIVITY: Ok for knee and ankle range of motion as tolerated  Wound Care: You may remove your surgical dressing. Incisions can be left open to air if there is no drainage. Once the incision is completely dry and without drainage, it may be left open to air out.  You may begin showering and getting incisions wet. Clean incision gently with soap and water.  DVT/PE prophylaxis: Aspirin x 30 days  Diet: as you were eating previously.  Can use over the counter stool softeners and bowel preparations, such as Miralax, to help with bowel movements.  Narcotics can be constipating.  Be sure to drink plenty of fluids  PAIN MEDICATION USE AND EXPECTATIONS  You have likely been given narcotic medications to help control your pain.  After a traumatic event that results in an fracture (broken bone) with or without surgery, it is ok to use narcotic pain medications to help control one's pain.  We understand that everyone responds to pain differently and each individual patient will be evaluated on a regular basis for the continued need for narcotic medications. Ideally, narcotic medication use should last no more than 6-8 weeks (coinciding with fracture healing).   As a patient it is your responsibility as well to monitor narcotic medication use and report the amount and frequency you use these medications when you come to your office visit.   We would also advise that if you are using narcotic medications, you should take a dose prior to therapy to maximize you participation.  IF YOU ARE ON NARCOTIC MEDICATIONS IT IS NOT PERMISSIBLE TO OPERATE A MOTOR VEHICLE (MOTORCYCLE/CAR/TRUCK/MOPED) OR HEAVY MACHINERY DO NOT MIX NARCOTICS WITH OTHER CNS (CENTRAL NERVOUS SYSTEM) DEPRESSANTS SUCH AS ALCOHOL   STOP SMOKING OR USING NICOTINE  PRODUCTS!!!!  As discussed nicotine severely impairs your body's ability to heal surgical and traumatic wounds but also impairs bone healing.  Wounds and bone heal by forming microscopic blood vessels (angiogenesis) and nicotine is a vasoconstrictor (essentially, shrinks blood vessels).  Therefore, if vasoconstriction occurs to these microscopic blood vessels they essentially disappear and are unable to deliver necessary nutrients to the healing tissue.  This is one modifiable factor that you can do to dramatically increase your chances of healing your injury.    (This means no smoking, no nicotine gum, patches, etc)  DO NOT USE NONSTEROIDAL ANTI-INFLAMMATORY DRUGS (NSAID'S)  Using products such as Advil (ibuprofen), Aleve (naproxen), Motrin (ibuprofen) for additional pain control during fracture healing can delay and/or prevent the healing response.  If you would like to take over the counter (OTC) medication, Tylenol (acetaminophen) is ok.  However, some narcotic medications that are given for pain control contain acetaminophen as well. Therefore, you should not exceed more than 4000 mg of tylenol in a day if you do not have liver disease.  Also note that there are may OTC medicines, such as cold medicines and allergy medicines that my contain tylenol as well.  If you have any questions about medications and/or interactions please ask your doctor/PA or your pharmacist.      ICE AND ELEVATE INJURED/OPERATIVE EXTREMITY  Using ice and elevating the injured extremity above your heart can help with swelling and pain control.  Icing in a pulsatile fashion, such as 20 minutes on and 20 minutes off, can be followed.    Do not place ice directly on skin. Make  sure there is a barrier between to skin and the ice pack.    Using frozen items such as frozen peas works well as the conform nicely to the are that needs to be iced.  USE AN ACE WRAP OR TED HOSE FOR SWELLING CONTROL  In addition to icing and elevation,  Ace wraps or TED hose are used to help limit and resolve swelling.  It is recommended to use Ace wraps or TED hose until you are informed to stop.    When using Ace Wraps start the wrapping distally (farthest away from the body) and wrap proximally (closer to the body)   Example: If you had surgery on your leg or thing and you do not have a splint on, start the ace wrap at the toes and work your way up to the thigh        If you had surgery on your upper extremity and do not have a splint on, start the ace wrap at your fingers and work your way up to the upper arm  IF YOU ARE IN A CAM BOOT (BLACK BOOT)  You may remove boot periodically. Perform daily dressing changes as noted below.  Wash the liner of the boot regularly and wear a sock when wearing the boot. It is recommended that you sleep in the boot until told otherwise   CALL THE OFFICE WITH ANY QUESTIONS OR CONCERNS: [phone]   VISIT OUR WEBSITE FOR ADDITIONAL INFORMATION: [email or web address]    Discharge Wound Care Instructions  Do NOT apply any ointments, solutions or lotions to pin sites or surgical wounds.  These prevent needed drainage and even though solutions like hydrogen peroxide kill bacteria, they also damage cells lining the pin sites that help fight infection.  Applying lotions or ointments can keep the wounds moist and can cause them to breakdown and open up as well. This can increase the risk for infection. When in doubt call the office.  If any drainage is noted, use one layer of adaptic or Mepitel, then gauze, Kerlix, and an ace wrap. - These dressing supplies should be available at local medical supply stores [facility], [facility], etc) as well as Management consultant ([facility], [facility], [place], etc)  Once the incision is completely dry and without drainage, it may be left open to air out.  Showering may begin 36-48 hours later.  Cleaning gently with soap and water.  Traumatic wounds should be dressed daily as  well.    One layer of adaptic, gauze, Kerlix, then ace wrap.  The adaptic can be discontinued once the draining has ceased    If you have a wet to dry dressing: wet the gauze with saline the squeeze as much saline out so the gauze is moist (not soaking wet), place moistened gauze over wound, then place a dry gauze over the moist one, followed by Kerlix wrap, then ace wrap.

## 2022-08-31 NOTE — Progress Notes (Signed)
Pt d/c/ left without NCM speaking with pt. Orders noted for DME (BSC,RW,W/C), and outpatient PT recommendations. Outpatient referral made with [facility]. NCM called and left voice message for pt to call NCM. NCM to arrange DME delivery to pt's place of residence once returned call received from pt , and will make pt aware of outpatient PT referral.  NCM will continue to monitor..... Whitman Hero RN,BSN,CM
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
## 2022-09-07 ENCOUNTER — Emergency Department (HOSPITAL_COMMUNITY): Payer: Medicaid Other

## 2022-09-07 ENCOUNTER — Encounter (HOSPITAL_COMMUNITY): Payer: Self-pay | Admitting: Emergency Medicine

## 2022-09-07 ENCOUNTER — Emergency Department (HOSPITAL_COMMUNITY)
Admission: EM | Admit: 2022-09-07 | Discharge: 2022-09-07 | Disposition: A | Discharge status: Home / Self Care | Payer: Medicaid Other | Attending: Emergency Medicine | Admitting: Emergency Medicine

## 2022-09-07 ENCOUNTER — Other Ambulatory Visit: Payer: Self-pay

## 2022-09-07 ENCOUNTER — Emergency Department (HOSPITAL_COMMUNITY)
Admission: EM | Admit: 2022-09-07 | Discharge: 2022-09-08 | Discharge status: Against Medical Advice | Payer: Medicaid Other | Source: Home / Self Care

## 2022-09-07 DIAGNOSIS — Z7982 Long term (current) use of aspirin: Secondary | ICD-10-CM | POA: Diagnosis not present

## 2022-09-07 DIAGNOSIS — M79604 Pain in right leg: Secondary | ICD-10-CM

## 2022-09-07 DIAGNOSIS — Z91040 Latex allergy status: Secondary | ICD-10-CM | POA: Insufficient documentation

## 2022-09-07 DIAGNOSIS — Z5321 Procedure and treatment not carried out due to patient leaving prior to being seen by health care provider: Secondary | ICD-10-CM | POA: Diagnosis not present

## 2022-09-07 LAB — COMPREHENSIVE METABOLIC PANEL
ALT: 39 U/L (ref 0–44)
AST: 33 U/L (ref 15–41)
Albumin: 3.2 g/dL — ABNORMAL LOW (ref 3.5–5.0)
Alkaline Phosphatase: 68 U/L (ref 38–126)
Anion gap: 11 (ref 5–15)
BUN: 22 mg/dL — ABNORMAL HIGH (ref 6–20)
CO2: 25 mmol/L (ref 22–32)
Calcium: 9.5 mg/dL (ref 8.9–10.3)
Chloride: 100 mmol/L (ref 98–111)
Creatinine, Ser: 0.98 mg/dL (ref 0.61–1.24)
GFR, Estimated: >60 mL/min (ref >60)
Glucose, Bld: 105 mg/dL — ABNORMAL HIGH (ref 70–99)
Potassium: 3.9 mmol/L (ref 3.5–5.1)
Sodium: 136 mmol/L (ref 135–145)
Total Bilirubin: 0.6 mg/dL (ref 0.3–1.2)
Total Protein: 6.9 g/dL (ref 6.5–8.1)

## 2022-09-07 LAB — CBC WITH DIFFERENTIAL/PLATELET
Abs Immature Granulocytes: 0.09 10*3/uL — ABNORMAL HIGH (ref 0.00–0.07)
Basophils Absolute: 0 10*3/uL (ref 0.0–0.1)
Basophils Relative: 0 %
Eosinophils Absolute: 0 10*3/uL (ref 0.0–0.5)
Eosinophils Relative: 0 %
HCT: 28.4 % — ABNORMAL LOW (ref 39.0–52.0)
Hemoglobin: 9.4 g/dL — ABNORMAL LOW (ref 13.0–17.0)
Immature Granulocytes: 1 %
Lymphocytes Relative: 10 %
Lymphs Abs: 1.5 10*3/uL (ref 0.7–4.0)
MCH: 30.6 pg (ref 26.0–34.0)
MCHC: 33.1 g/dL (ref 30.0–36.0)
MCV: 92.5 fL (ref 80.0–100.0)
Monocytes Absolute: 1.1 10*3/uL — ABNORMAL HIGH (ref 0.1–1.0)
Monocytes Relative: 8 %
Neutro Abs: 11.8 10*3/uL — ABNORMAL HIGH (ref 1.7–7.7)
Neutrophils Relative %: 81 %
Platelets: 789 10*3/uL — ABNORMAL HIGH (ref 150–400)
RBC: 3.07 MIL/uL — ABNORMAL LOW (ref 4.22–5.81)
RDW: 14.6 % (ref 11.5–15.5)
WBC: 14.5 10*3/uL — ABNORMAL HIGH (ref 4.0–10.5)
nRBC: 0 % (ref 0.0–0.2)

## 2022-09-07 LAB — MAGNESIUM: Magnesium: 2 mg/dL (ref 1.7–2.4)

## 2022-09-07 LAB — CK: Total CK: 59 U/L (ref 49–397)

## 2022-09-07 MED ORDER — KETOROLAC TROMETHAMINE 15 MG/ML IJ SOLN
15.0000 mg | Freq: Once | INTRAMUSCULAR | Status: AC
  Administered 2022-09-07: 15 mg via INTRAVENOUS
  Filled 2022-09-07: qty 1

## 2022-09-07 MED ORDER — FENTANYL CITRATE PF 50 MCG/ML IJ SOSY
100.0000 ug | PREFILLED_SYRINGE | Freq: Once | INTRAMUSCULAR | Status: AC
  Administered 2022-09-07: 100 ug via INTRAVENOUS
  Filled 2022-09-07: qty 2

## 2022-09-07 MED ORDER — IOHEXOL 350 MG/ML SOLN
75.0000 mL | Freq: Once | INTRAVENOUS | Status: AC | PRN
  Administered 2022-09-07: 75 mL via INTRAVENOUS

## 2022-09-07 MED ORDER — OXYCODONE HCL 5 MG PO TABS
20.0000 mg | ORAL_TABLET | Freq: Once | ORAL | Status: AC
  Administered 2022-09-07: 20 mg via ORAL
  Filled 2022-09-07: qty 4

## 2022-09-07 MED ORDER — OXYCODONE-ACETAMINOPHEN 5-325 MG PO TABS
2.0000 | ORAL_TABLET | Freq: Once | ORAL | Status: AC
  Administered 2022-09-07: 2 via ORAL
  Filled 2022-09-07: qty 2

## 2022-09-07 MED ORDER — OXYCODONE HCL 10 MG PO TABS: 10.0000 mg | ORAL_TABLET | Freq: Four times a day (QID) | ORAL | 0 refills | Status: AC | PRN

## 2022-09-07 MED ORDER — ONDANSETRON HCL 4 MG/2ML IJ SOLN
4.0000 mg | Freq: Once | INTRAMUSCULAR | Status: AC
  Administered 2022-09-07: 4 mg via INTRAVENOUS
  Filled 2022-09-07: qty 2

## 2022-09-07 MED ORDER — LACTATED RINGERS IV BOLUS
1000.0000 mL | Freq: Once | INTRAVENOUS | Status: AC
  Administered 2022-09-07: 1000 mL via INTRAVENOUS

## 2022-09-07 NOTE — Discharge Instructions (Addendum)
You should be taking ibuprofen and Tylenol for management of your pain.  Narcotic pain medication should be used only if you have severe pain despite the over-the-counter medications.  The more narcotics you take, the less effective they will be.  There were oxycodone tablets that were sent to your pharmacy.  These are to be taken only as needed.  You will need to follow-up with the orthopedic surgeon as soon as possible.  His number is below.  Call this number first thing in the morning to set up that follow-up appointment.  Return to the emergency department for any new or worsening symptoms of concern.

## 2022-09-07 NOTE — ED Notes (Signed)
Pt said they were able to find someone to pick them up, states they are leaving.
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
## 2022-09-07 NOTE — ED Notes (Signed)
Pt states he takes suboxone
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
## 2024-01-26 ENCOUNTER — Emergency Department (HOSPITAL_COMMUNITY): Payer: MEDICAID

## 2024-01-26 ENCOUNTER — Emergency Department (HOSPITAL_COMMUNITY)
Admission: EM | Admit: 2024-01-26 | Discharge: 2024-01-27 | Disposition: A | Discharge status: Home / Self Care | Payer: MEDICAID | Attending: Emergency Medicine | Admitting: Emergency Medicine

## 2024-01-26 ENCOUNTER — Other Ambulatory Visit: Payer: Self-pay

## 2024-01-26 ENCOUNTER — Encounter (HOSPITAL_COMMUNITY): Payer: Self-pay

## 2024-01-26 DIAGNOSIS — R109 Unspecified abdominal pain: Secondary | ICD-10-CM | POA: Diagnosis present

## 2024-01-26 DIAGNOSIS — Z91040 Latex allergy status: Secondary | ICD-10-CM | POA: Insufficient documentation

## 2024-01-26 DIAGNOSIS — D72829 Elevated white blood cell count, unspecified: Secondary | ICD-10-CM | POA: Diagnosis not present

## 2024-01-26 DIAGNOSIS — R112 Nausea with vomiting, unspecified: Secondary | ICD-10-CM

## 2024-01-26 DIAGNOSIS — N3090 Cystitis, unspecified without hematuria: Secondary | ICD-10-CM | POA: Diagnosis not present

## 2024-01-26 LAB — COMPREHENSIVE METABOLIC PANEL WITH GFR
ALT: 18 U/L (ref 0–44)
AST: 34 U/L (ref 15–41)
Albumin: 4 g/dL (ref 3.5–5.0)
Alkaline Phosphatase: 59 U/L (ref 38–126)
Anion gap: 13 (ref 5–15)
BUN: 22 mg/dL — ABNORMAL HIGH (ref 6–20)
CO2: 22 mmol/L (ref 22–32)
Calcium: 9.5 mg/dL (ref 8.9–10.3)
Chloride: 102 mmol/L (ref 98–111)
Creatinine, Ser: 1.46 mg/dL — ABNORMAL HIGH (ref 0.61–1.24)
GFR, Estimated: 59 mL/min — ABNORMAL LOW (ref >60)
Glucose, Bld: 81 mg/dL (ref 70–99)
Potassium: 4.1 mmol/L (ref 3.5–5.1)
Sodium: 137 mmol/L (ref 135–145)
Total Bilirubin: 1.5 mg/dL — ABNORMAL HIGH (ref 0.0–1.2)
Total Protein: 6.6 g/dL (ref 6.5–8.1)

## 2024-01-26 LAB — URINALYSIS, ROUTINE W REFLEX MICROSCOPIC
Bilirubin Urine: NEGATIVE
Glucose, UA: NEGATIVE mg/dL
Ketones, ur: 20 mg/dL — AB
Nitrite: NEGATIVE
Protein, ur: 100 mg/dL — AB
Specific Gravity, Urine: 1.023 (ref 1.005–1.030)
pH: 5 (ref 5.0–8.0)

## 2024-01-26 LAB — CBC WITH DIFFERENTIAL/PLATELET
Abs Immature Granulocytes: 0.03 10*3/uL (ref 0.00–0.07)
Basophils Absolute: 0.1 10*3/uL (ref 0.0–0.1)
Basophils Relative: 1 %
Eosinophils Absolute: 0 10*3/uL (ref 0.0–0.5)
Eosinophils Relative: 0 %
HCT: 39.2 % (ref 39.0–52.0)
Hemoglobin: 13.3 g/dL (ref 13.0–17.0)
Immature Granulocytes: 0 %
Lymphocytes Relative: 8 %
Lymphs Abs: 0.8 10*3/uL (ref 0.7–4.0)
MCH: 30.4 pg (ref 26.0–34.0)
MCHC: 33.9 g/dL (ref 30.0–36.0)
MCV: 89.5 fL (ref 80.0–100.0)
Monocytes Absolute: 0.6 10*3/uL (ref 0.1–1.0)
Monocytes Relative: 5 %
Neutro Abs: 9.4 10*3/uL — ABNORMAL HIGH (ref 1.7–7.7)
Neutrophils Relative %: 86 %
Platelets: 306 10*3/uL (ref 150–400)
RBC: 4.38 MIL/uL (ref 4.22–5.81)
RDW: 14.9 % (ref 11.5–15.5)
WBC: 10.9 10*3/uL — ABNORMAL HIGH (ref 4.0–10.5)
nRBC: 0 % (ref 0.0–0.2)

## 2024-01-26 LAB — LIPASE, BLOOD: Lipase: 26 U/L (ref 11–51)

## 2024-01-26 MED ORDER — IOHEXOL 350 MG/ML SOLN
75.0000 mL | Freq: Once | INTRAVENOUS | Status: AC | PRN
  Administered 2024-01-26: 75 mL via INTRAVENOUS

## 2024-01-26 MED ORDER — SODIUM CHLORIDE 0.9 % IV BOLUS
1000.0000 mL | Freq: Once | INTRAVENOUS | Status: AC
  Administered 2024-01-26: 1000 mL via INTRAVENOUS

## 2024-01-26 MED ORDER — ONDANSETRON HCL 4 MG/2ML IJ SOLN
4.0000 mg | Freq: Once | INTRAMUSCULAR | Status: AC
  Administered 2024-01-26: 4 mg via INTRAVENOUS
  Filled 2024-01-26: qty 2

## 2024-01-26 NOTE — ED Triage Notes (Signed)
 Pt BIBEMS from home came to ED c/o abdominal pain with nausea and vomiting x 2-3 days

## 2024-01-26 NOTE — ED Provider Notes (Signed)
 [place] EMERGENCY DEPARTMENT AT [facility] Provider Note   CSN: [id number] Arrival date & time: 01/26/24  2123     History  Chief Complaint  Patient presents with   Abdominal Pain   Nausea    Steven Montoya Marieta Shorten. is a 48 y.o. male with medical history of rectal bleeding, stimulant induced mood disorder, mitral valve prolapse, opiate dependence, MDD.  Patient presents to ED for evaluation of abdominal pain, nausea and vomiting.  Patient reports history of cyclical vomiting syndrome.  States that he has never seen GI doctor for this, never seen a PCP for this.  He reports that for the last 3 days he has had intractable nausea and vomiting.  States that whenever he tries to ingest something, he will throw it up.  He denies diarrhea.  Denies blood in stool, blood in vomit.  He is unsure what causes him to have cyclical vomiting.  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  He reports that he has a Suboxone  at home and meant to grab it off of the counter before becoming to the ER.  He also reports dysuria for "months".  He denies any concern for STIs but then states that he "is not sure what my wife does".  He is not requesting STI testing.  He believes he might have a UTI.  He denies fevers at home.  Denies chest pain or shortness of breath.  Denies drug or alcohol use.  Reports he smokes marijuana "every day".  Denies history of cannabinoid hyperemesis syndrome.  Patient requesting food on my examination.   Abdominal Pain Associated symptoms: dysuria, nausea and vomiting   Associated symptoms: no diarrhea and no fever        Home Medications Prior to Admission medications   Medication Sig Start Date End Date Taking? Authorizing Provider  cephALEXin  (KEFLEX ) 500 MG capsule Take 1 capsule (500 mg total) by mouth 3 (three) times daily. 01/27/24  Yes Adel Aden, PA-C  ondansetron  (ZOFRAN -ODT) 4 MG disintegrating tablet Take 1 tablet (4 mg  total) by mouth every 8 (eight) hours as needed for nausea or vomiting. 01/27/24  Yes Adel Aden, PA-C  acetaminophen  (TYLENOL ) 500 MG tablet Take 2 tablets (1,000 mg total) by mouth every 6 (six) hours as needed for mild pain or moderate pain. 08/31/22   Versie Gores, PA-C  gabapentin  (NEURONTIN ) 300 MG capsule Take 1 capsule (300 mg total) by mouth 3 (three) times daily. 08/31/22   Versie Gores, PA-C  methocarbamol  (ROBAXIN ) 500 MG tablet Take 2 tablets (1,000 mg total) by mouth 3 (three) times daily. 08/31/22   Versie Gores, PA-C      Allergies    Latex and Adhesive [tape]    Review of Systems   Review of Systems  Constitutional:  Negative for fever.  Gastrointestinal:  Positive for abdominal pain, nausea and vomiting. Negative for blood in stool and diarrhea.  Genitourinary:  Positive for dysuria. Negative for flank pain and penile discharge.  All other systems reviewed and are negative.   Physical Exam Updated Vital Signs BP (!) 125/100   Pulse 79   Temp 98.4 F (36.9 C) (Oral)   Resp 17   Ht 6' (1.829 m)   Wt 75 kg   SpO2 100%   BMI 22.42 kg/m  Physical Exam Vitals and nursing note reviewed.  Constitutional:      General: He is not in acute distress.  Appearance: He is well-developed.  HENT:     Head: Normocephalic and atraumatic.  Eyes:     Conjunctiva/sclera: Conjunctivae normal.  Cardiovascular:     Rate and Rhythm: Normal rate and regular rhythm.     Heart sounds: No murmur heard. Pulmonary:     Effort: Pulmonary effort is normal. No respiratory distress.     Breath sounds: Normal breath sounds.  Abdominal:     Palpations: Abdomen is soft.     Tenderness: There is abdominal tenderness. There is no right CVA tenderness or left CVA tenderness.     Comments: Nonfocal tenderness to palpation.  No rebound or guarding.  No overlying skin change.  No CVA tenderness bilaterally.  Musculoskeletal:        General: No swelling.     Cervical back: Neck  supple.  Skin:    General: Skin is warm and dry.     Capillary Refill: Capillary refill takes less than 2 seconds.  Neurological:     Mental Status: He is alert and oriented to person, place, and time. Mental status is at baseline.  Psychiatric:        Mood and Affect: Mood normal.     ED Results / Procedures / Treatments   Labs (all labs ordered are listed, but only abnormal results are displayed) Labs Reviewed  CBC WITH DIFFERENTIAL/PLATELET - Abnormal; Notable for the following components:      Result Value   WBC 10.9 (*)    Neutro Abs 9.4 (*)    All other components within normal limits  COMPREHENSIVE METABOLIC PANEL WITH GFR - Abnormal; Notable for the following components:   BUN 22 (*)    Creatinine, Ser 1.46 (*)    Total Bilirubin 1.5 (*)    GFR, Estimated 59 (*)    All other components within normal limits  URINALYSIS, ROUTINE W REFLEX MICROSCOPIC - Abnormal; Notable for the following components:   Color, Urine AMBER (*)    APPearance HAZY (*)    Hgb urine dipstick SMALL (*)    Ketones, ur 20 (*)    Protein, ur 100 (*)    Leukocytes,Ua SMALL (*)    Bacteria, UA RARE (*)    All other components within normal limits  URINE CULTURE  LIPASE, BLOOD  GC/CHLAMYDIA PROBE AMP ([facility]) NOT AT [facility]    EKG None  Radiology CT ABDOMEN PELVIS W CONTRAST Result Date: 01/27/2024 CLINICAL DATA:  Abdomen pain with nausea and vomiting per epic notes EXAM: CT ABDOMEN AND PELVIS WITH CONTRAST TECHNIQUE: Multidetector CT imaging of the abdomen and pelvis was performed using the standard protocol following bolus administration of intravenous contrast. RADIATION DOSE REDUCTION: This exam was performed according to the departmental dose-optimization program which includes automated exposure control, adjustment of the mA and/or kV according to patient size and/or use of iterative reconstruction technique. CONTRAST:  75mL OMNIPAQUE  IOHEXOL  350 MG/ML SOLN COMPARISON:  CT 09/24/2018  FINDINGS: Lower chest: Lung bases demonstrate coarse dystrophic appearing calcification at the right base. No acute airspace disease. Hepatobiliary: No focal liver abnormality is seen. No gallstones, gallbladder wall thickening, or biliary dilatation. Pancreas: Unremarkable. No pancreatic ductal dilatation or surrounding inflammatory changes. Spleen: Normal in size without focal abnormality. Adrenals/Urinary Tract: Adrenal glands are normal. No hydronephrosis. Punctate nonobstructing stone in the left kidney. The bladder is diffusely thick walled Stomach/Bowel: The stomach is nonenlarged. No dilated small bowel. Equivocal wall thickening versus under distension of the jejunum. Vascular/Lymphatic: Aortic atherosclerosis. No enlarged abdominal or pelvic lymph  nodes. Reproductive: Prostate is unremarkable. Other: Negative for pelvic effusion or free air. Musculoskeletal: No acute or suspicious osseous abnormality IMPRESSION: 1. Equivocal wall thickening/enteritis versus under distension of the jejunum. No evidence for a bowel obstruction. 2. Punctate nonobstructing left kidney stone. Diffuse bladder wall thickening, correlate with urinalysis to exclude cystitis. 3. Aortic atherosclerosis. Aortic Atherosclerosis (ICD10-I70.0). Electronically Signed   By: Esmeralda Hedge M.D.   On: 01/27/2024 00:03    Procedures Procedures    Medications Ordered in ED Medications  sodium chloride  0.9 % bolus 1,000 mL (0 mLs Intravenous Stopped 01/26/24 2343)  ondansetron  (ZOFRAN ) injection 4 mg (4 mg Intravenous Given 01/26/24 2150)  iohexol  (OMNIPAQUE ) 350 MG/ML injection 75 mL (75 mLs Intravenous Contrast Given 01/26/24 2352)  cephALEXin  (KEFLEX ) capsule 500 mg (500 mg Oral Given 01/27/24 0015)    ED Course/ Medical Decision Making/ A&P   Medical Decision Making Amount and/or Complexity of Data Reviewed Labs: ordered. Radiology: ordered.  Risk Prescription drug management.   48 year old male presents for evaluation.   Please see HPI for further details.  On examination the patient is afebrile and nontachycardic.  Lung sounds are clear bilaterally, not hypoxic.  Patient abdomen soft and compressible with nonfocal tenderness.  No rebound or guarding.  No overlying skin change.  No CVA tenderness bilaterally.  Neuroexam at baseline.  Overall nontoxic appearance with reassuring vital signs.  Patient lab work initiated in triage include CBC, CMP, urinalysis, lipase, CT abdomen pelvis.  Patient CBC with slight leukocytosis 10.9, no anemia.  Metabolic panel with creatinine 1.46 which appears baseline, total bilirubin 1.5, GFR 59, BUN 22.  No other electrolyte derangement.  No elevated LFTs.  Urinalysis shows small hemoglobin, ketones, proteins, leukocytes and rare bacteria.  Will culture patient urine.  He does endorse dysuria so we will start him on Keflex .  Will also send patient urine off for GC/chlamydia screening.  CT scan shows equivocal wall thickening/enteritis versus underdistention of the jejunum.  Patient denies any diarrhea.  Also noted to have diffuse bladder wall thickening.  Patient urinalysis does correlate with infection.  Will start patient on Keflex  for UTI.  Will also provide patient 1 L of fluid.  He was also given Zofran  for nausea.  At this time, patient reports symptoms have resolved.  States he feels better.  He has had no nausea or vomiting here in the department.  He has been able to tolerate oral intake.  Have advised him to follow-up on the results of GC/chlamydia testing here.  Have advised him I will send him home with Keflex  for the next 7 days and he voiced understanding.  Will also send with Zofran .  He was encouraged to follow-up with his PCP.  Return precautions given and he voiced understanding.  Stable to discharge home.   Final Clinical Impression(s) / ED Diagnoses Final diagnoses:  Cystitis    Rx / DC Orders ED Discharge Orders          Ordered    cephALEXin  (KEFLEX ) 500 MG  capsule  3 times daily        01/27/24 0033    ondansetron  (ZOFRAN -ODT) 4 MG disintegrating tablet  Every 8 hours PRN        01/27/24 0033              Sieanna Vanstone F, PA-C 01/27/24 0033    Lowery Rue, DO 01/27/24 1800

## 2024-01-26 NOTE — ED Provider Notes (Incomplete)
 [place] EMERGENCY DEPARTMENT AT [facility] Provider Note   CSN: [id number] Arrival date & time: 01/26/24  2123     History  Chief Complaint  Patient presents with  . Abdominal Pain  . Nausea    Steven Montoya. is a 48 y.o. male with medical history of rectal bleeding, stimulant induced mood disorder, mitral valve prolapse, opiate dependence, MDD.  Patient presents to ED for evaluation of abdominal pain, nausea and vomiting.  Patient reports history of cyclical vomiting syndrome.  States that he has never seen GI doctor for this, never seen a PCP for this.  He reports that for the last 3 days he has had intractable nausea and vomiting.  States that whenever he tries to ingest something, he will throw it up.  He denies diarrhea.  Denies blood in stool, blood in vomit.  He is unsure what causes him to have cyclical vomiting.  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  He reports that he has a Suboxone  at home and meant to grab it off of the counter before becoming to the ER.  He also reports dysuria for "months".  He denies any concern for STIs but then states that he "is not sure what my wife does".  He is not requesting STI testing.  He believes he might have a UTI.  He denies fevers at home.  Denies chest pain or shortness of breath.  Denies drug or alcohol use.  Reports he smokes marijuana "every day".  Denies history of cannabinoid hyperemesis syndrome.   Abdominal Pain      Home Medications Prior to Admission medications   Medication Sig Start Date End Date Taking? Authorizing Provider  acetaminophen  (TYLENOL ) 500 MG tablet Take 2 tablets (1,000 mg total) by mouth every 6 (six) hours as needed for mild pain or moderate pain. 08/31/22   Versie Gores, PA-C  gabapentin  (NEURONTIN ) 300 MG capsule Take 1 capsule (300 mg total) by mouth 3 (three) times daily. 08/31/22   Versie Gores, PA-C  methocarbamol  (ROBAXIN ) 500 MG tablet Take 2  tablets (1,000 mg total) by mouth 3 (three) times daily. 08/31/22   Versie Gores, PA-C      Allergies    Latex and Adhesive [tape]    Review of Systems   Review of Systems  Gastrointestinal:  Positive for abdominal pain.    Physical Exam Updated Vital Signs BP (!) 138/96 (BP Location: Left Arm)   Pulse 78   Temp 98.4 F (36.9 C) (Oral)   Resp 17   Ht 6' (1.829 m)   Wt 75 kg   SpO2 95%   BMI 22.42 kg/m  Physical Exam  ED Results / Procedures / Treatments   Labs (all labs ordered are listed, but only abnormal results are displayed) Labs Reviewed  CBC WITH DIFFERENTIAL/PLATELET - Abnormal; Notable for the following components:      Result Value   WBC 10.9 (*)    Neutro Abs 9.4 (*)    All other components within normal limits  COMPREHENSIVE METABOLIC PANEL WITH GFR - Abnormal; Notable for the following components:   BUN 22 (*)    Creatinine, Ser 1.46 (*)    Total Bilirubin 1.5 (*)    GFR, Estimated 59 (*)    All other components within normal limits  LIPASE, BLOOD  URINALYSIS, ROUTINE W REFLEX MICROSCOPIC    EKG None  Radiology No results found.  Procedures Procedures  {  Document cardiac monitor, telemetry assessment procedure when appropriate:1}  Medications Ordered in ED Medications  sodium chloride  0.9 % bolus 1,000 mL (1,000 mLs Intravenous New Bag/Given 01/26/24 2150)  ondansetron  (ZOFRAN ) injection 4 mg (4 mg Intravenous Given 01/26/24 2150)    ED Course/ Medical Decision Making/ A&P   {   Click here for ABCD2, HEART and other calculatorsREFRESH Note before signing :1}                              Medical Decision Making Amount and/or Complexity of Data Reviewed Labs: ordered. Radiology: ordered.  Risk Prescription drug management.   ***  {Document critical care time when appropriate:1} {Document review of labs and clinical decision tools ie heart score, Chads2Vasc2 etc:1}  {Document your independent review of radiology images, and any  outside records:1} {Document your discussion with family members, caretakers, and with consultants:1} {Document social determinants of health affecting pt's care:1} {Document your decision making why or why not admission, treatments were needed:1} Final Clinical Impression(s) / ED Diagnoses Final diagnoses:  None    Rx / DC Orders ED Discharge Orders     None

## 2024-01-27 MED ORDER — ONDANSETRON 4 MG PO TBDP: 4.0000 mg | ORAL_TABLET | Freq: Three times a day (TID) | ORAL | 0 refills | Status: AC | PRN

## 2024-01-27 MED ORDER — CEPHALEXIN 500 MG PO CAPS: 500.0000 mg | ORAL_CAPSULE | Freq: Three times a day (TID) | ORAL | 0 refills | Status: AC

## 2024-01-27 MED ORDER — CEPHALEXIN 250 MG PO CAPS
500.0000 mg | ORAL_CAPSULE | Freq: Once | ORAL | Status: AC
  Administered 2024-01-27: 500 mg via ORAL
  Filled 2024-01-27: qty 2

## 2024-01-27 NOTE — Discharge Instructions (Addendum)
 It was a pleasure taking part in your care.  As discussed, your urinalysis and CT scan does show evidence that you have a UTI.  Please begin taking Keflex  3 times a day for the next 7 days.  Please take Zofran  every 6 hours as needed for nausea.  Please follow-up with your PCP.  If you develop any fevers, intractable nausea or vomiting please return to the ED.  Please follow-up on the results of your STI testing done here today on MyChart.  If it is positive, please return to the ED or health department for treatment.

## 2024-01-30 LAB — GC/CHLAMYDIA PROBE AMP (~~LOC~~) NOT AT ARMC
Chlamydia: NEGATIVE
Comment: NEGATIVE
Comment: NORMAL
Neisseria Gonorrhea: NEGATIVE
